# Patient Record
Sex: Male | Born: 1950
Health system: Southern US, Community
[De-identification: ages and names within clinical notes are randomized; demographics above are authoritative.]

## PROBLEM LIST (undated history)

## (undated) DIAGNOSIS — M4800 Spinal stenosis, site unspecified: Secondary | ICD-10-CM

## (undated) DIAGNOSIS — R4182 Altered mental status, unspecified: Secondary | ICD-10-CM

## (undated) DIAGNOSIS — I10 Essential (primary) hypertension: Secondary | ICD-10-CM

## (undated) DIAGNOSIS — I714 Abdominal aortic aneurysm, without rupture, unspecified: Secondary | ICD-10-CM

## (undated) DIAGNOSIS — R413 Other amnesia: Secondary | ICD-10-CM

## (undated) DIAGNOSIS — M1990 Unspecified osteoarthritis, unspecified site: Secondary | ICD-10-CM

## (undated) DIAGNOSIS — R410 Disorientation, unspecified: Secondary | ICD-10-CM

## (undated) DIAGNOSIS — R569 Unspecified convulsions: Secondary | ICD-10-CM

## (undated) HISTORY — DX: Disorientation, unspecified: R41.0

## (undated) HISTORY — DX: Other amnesia: R41.3

## (undated) HISTORY — PX: OTHER SURGICAL HISTORY: SHX169

## (undated) HISTORY — DX: Altered mental status, unspecified: R41.82

## (undated) HISTORY — PX: ABDOMINAL SURGERY: SHX537

---

## 2001-01-17 ENCOUNTER — Encounter: Payer: Self-pay | Admitting: Family Medicine

## 2001-01-17 ENCOUNTER — Ambulatory Visit (HOSPITAL_COMMUNITY): Admission: RE | Admit: 2001-01-17 | Discharge: 2001-01-17 | Payer: Self-pay | Admitting: Family Medicine

## 2003-09-21 HISTORY — PX: INGUINAL HERNIA REPAIR: SHX194

## 2003-09-21 HISTORY — PX: SMALL INTESTINE SURGERY: SHX150

## 2003-09-29 ENCOUNTER — Inpatient Hospital Stay (HOSPITAL_COMMUNITY): Admission: EM | Admit: 2003-09-29 | Discharge: 2003-10-11 | Payer: Self-pay | Admitting: Emergency Medicine

## 2003-10-13 ENCOUNTER — Emergency Department (HOSPITAL_COMMUNITY): Admission: EM | Admit: 2003-10-13 | Discharge: 2003-10-13 | Payer: Self-pay | Admitting: Emergency Medicine

## 2004-11-16 ENCOUNTER — Emergency Department (HOSPITAL_COMMUNITY): Admission: EM | Admit: 2004-11-16 | Discharge: 2004-11-16 | Payer: Self-pay | Admitting: Emergency Medicine

## 2004-11-23 ENCOUNTER — Ambulatory Visit (HOSPITAL_COMMUNITY): Admission: RE | Admit: 2004-11-23 | Discharge: 2004-11-23 | Payer: Self-pay | Admitting: Family Medicine

## 2005-05-21 ENCOUNTER — Ambulatory Visit (HOSPITAL_COMMUNITY): Admission: RE | Admit: 2005-05-21 | Discharge: 2005-05-21 | Payer: Self-pay | Admitting: Family Medicine

## 2005-12-07 ENCOUNTER — Ambulatory Visit (HOSPITAL_COMMUNITY): Admission: RE | Admit: 2005-12-07 | Discharge: 2005-12-07 | Payer: Self-pay | Admitting: Family Medicine

## 2007-09-02 ENCOUNTER — Emergency Department (HOSPITAL_COMMUNITY): Admission: EM | Admit: 2007-09-02 | Discharge: 2007-09-02 | Payer: Self-pay | Admitting: Emergency Medicine

## 2008-01-03 ENCOUNTER — Emergency Department (HOSPITAL_COMMUNITY): Admission: EM | Admit: 2008-01-03 | Discharge: 2008-01-04 | Payer: Self-pay | Admitting: Emergency Medicine

## 2010-05-22 ENCOUNTER — Emergency Department (HOSPITAL_COMMUNITY): Admission: EM | Admit: 2010-05-22 | Discharge: 2010-05-22 | Payer: Self-pay | Admitting: Emergency Medicine

## 2010-09-20 HISTORY — PX: COLONOSCOPY: SHX174

## 2010-12-03 LAB — URINALYSIS, ROUTINE W REFLEX MICROSCOPIC
Glucose, UA: 100 mg/dL — AB
Protein, ur: 30 mg/dL — AB
Specific Gravity, Urine: >1.03 — ABNORMAL HIGH (ref 1.005–1.030)
pH: 5.5 (ref 5.0–8.0)

## 2010-12-03 LAB — URINE MICROSCOPIC-ADD ON

## 2010-12-03 LAB — URINE CULTURE

## 2011-02-05 NOTE — Op Note (Signed)
NAME:  Thomas [name], Thomas [name]                             ACCOUNT NO.:  [id number]   MEDICAL RECORD NO.:  [id number]                   PATIENT TYPE:  INP   LOCATION:  A330                                 FACILITY:  [facility]   PHYSICIAN:  Dalia Heading, M.D.               DATE OF BIRTH:  [birth year]   DATE OF PROCEDURE:  09/30/2003  DATE OF DISCHARGE:                                 OPERATIVE REPORT   PREOPERATIVE DIAGNOSIS:  Incarcerated right inguinal hernia.   POSTOPERATIVE DIAGNOSIS:  Strangulated right inguinal hernia with small  bowel.   PROCEDURE:  Right inguinal herniorrhaphy for strangulation, partial small  bowel resection.   SURGEON:  Dalia Heading, M.D.   ANESTHESIA:  Spinal.   INDICATIONS:  The patient is a 60 year old black male who presented  yesterday evening to the emergency room with right groin pain and a partial  small-bowel obstruction.  The hernia was reduced without difficulty in the  emergency room.  The pain immediately was relieved.  He now presents to the  operating room for a right inguinal herniorrhaphy.  The risks and benefits  of the procedure including bleeding, infection, and the possible recurrence  of the hernia were fully explained to the patient, who gave informed  consent.   PROCEDURE NOTE:  The patient was placed in the supine position after a  spinal anesthesia was administered.  The right groin region was prepped and  draped using the usual sterile technique with Betadine.  Surgical site  confirmation was performed.   An transverse incision was made in the right groin region down to the  external oblique aponeurosis.  The aponeurosis was incised to the external  ring.  A Penrose drain was placed around he spermatic cord.  There was a  varicocele and hydrocele present.  On inspection of the indirect hernia, a 2-  3 cm segment of small bowel was noted to be gangrenous.  It had not  perforated.  It was elected to proceed with a partial small bowel  resection.  This was done through the indirect inguinal hernia without difficulty.  GIAs  were placed proximally and distally on the portion of small bowel and fired.  A side-to-side enteroenterotomy was then performed using a GIA stapler.  The  enterotomy was closed using a TA-55 stapler.  The staple line was bolstered  using 3-0 silk sutures.  The bowel was then reduced into the abdominal  cavity.  After some time, the anastomotic line was inspected and minimal  ischemia was noted.  The small-bowel tissue edges were noted to be well  vascularized.  It was elected to proceed with a hernia repair.   The indirect hernia sac was closed using a 2-0 Novofil purse-string suture  at the peritoneal reflection.  A medium size polypropylene plug was then  placed into the direct hernia without difficulty.  This was extraperitoneal  in nature.  An [place] polypropylene mesh patch was then placed along the  floor of the inguinal canal and secured superiorly to the conjoined tendon  and inferiorly to the shelving edge of Poupart's ligament using 2-0 Novofil  interrupted sutures. The internal ring was recreated using a 2-0 Novofil  interrupted suture.  The external oblique aponeurosis was reapproximated  using a 2-0 Vicryl running suture.  The subcutaneous layer was  reapproximated using a 3-0 Vicryl interrupted suture.  The skin was closed  using staples.  Betadine ointment and dry sterile dressing were applied.  Sensorcaine 0.5% was instilled into the surrounding wound.   All tape and needle counts were correct at the end of the procedure.  The  patient was transferred to PACU in stable condition.   COMPLICATIONS:  Strangulation of small bowel.   SPECIMEN:  Small bowel, partial.   BLOOD LOSS:  Minimal.      ___________________________________________                                            Dalia Heading, M.D.   MAJ/MEDQ  D:  09/30/2003  T:  09/30/2003  Job:  [id number]   cc:   Patrica Duel, M.D.  [address]  [place]  [place] [address]  Fax: [phone]

## 2011-02-05 NOTE — Op Note (Signed)
NAME:  Thomas [name], Thomas [name]                             ACCOUNT NO.:  [id number]   MEDICAL RECORD NO.:  [id number]                   PATIENT TYPE:  INP   LOCATION:  A330                                 FACILITY:  [facility]   PHYSICIAN:  Dalia Heading, M.D.               DATE OF BIRTH:  [birth year]   DATE OF PROCEDURE:  10/04/2003  DATE OF DISCHARGE:                                 OPERATIVE REPORT   PREOPERATIVE DIAGNOSIS:  Small-bowel obstruction.   POSTOPERATIVE DIAGNOSIS:  Small-bowel obstruction.   PROCEDURE:  Exploratory laparotomy, partial small bowel resection.   SURGEON:  Dalia Heading, M.D.   ANESTHESIA:  General endotracheal.   INDICATIONS FOR PROCEDURE:  The patient is a 60 year old black male five  days status post a right inguinal herniorrhaphy and partial small bowel  resection for a strangulated hernia who now presents with what appears to be  a small-bowel obstruction.  It is difficult to tell whether the patient has  further ischemia in the small bowel, twisted small bowel, or an anastomotic  stricture.  The patient now comes to the operating room for an exploratory  laparotomy.  The risks and benefits of the procedure were fully explained to  the patient who gave informed consent.   DESCRIPTION OF PROCEDURE:  The patient was placed in the supine position.  After induction of general endotracheal anesthesia, the abdomen was prepped  and draped using the usual sterile technique with Betadine.  Surgical site  confirmation was performed.   A midline incision was taken down to the fascia.  The peritoneal cavity was  entered into without difficulty.  Upon entering the abdominal cavity,  diffuse small bowel dilatation with fluid-filled loops was noted.  The bowel  was eviscerated, and the anastomotic region was inspected.  The bowel was  dilated proximally and the bowel collapsed distally.  The anastomosis was  noted to be patent though narrowed.  It was decided to redo the  anastomosis.  The bowel was first evacuated of its fluid through retrograde milking of the  small bowel to the stomach.  A GIA stapler was placed proximally and  distally along the distal small bowel around the anastomosis.  The mesentery  was divided using an LDS stapler.  A side-to-side enteroenterotomy was then  performed using a GIA 50 stapler.  The enterotomy was closed using a TA55  stapler.  The staple line was bolstered using 3-0 silk Lembert suture.  A  large anastomosis was confirmed.  The mesenteric defect was closed using an  0 chromic gut running suture.  The bowel was returned to the abdominal  cavity in an orderly fashion.  The liver, gallbladder, appendix, ascending  colon, transverse colon, and descending colon  regions were all within  normal limits.  No abnormal lesions were noted.  The right lower quadrant  where the previous herniorrhaphy was was inspected, and the hernia  repair  was noted to be intact.  The bowel was again inspected, and there was no  evidence of ischemic changes.  The anastomosis was again noted to be widely  patent.  The bowel was returned into the abdominal cavity in an orderly  fashion.  The abdominal cavity was then copiously irrigated with warm normal  saline.   The fascia was reapproximated using a looped 0 Novofil running suture.  The  subcutaneous layer was irrigated with normal saline, and the skin was closed  using staples.  Betadine ointment and dry sterile dressing were applied.   All tape and needle counts were correct at the end of the procedure.  The  patient was extubated in the operating room and went back to the recovery  room awake and in stable condition.   COMPLICATIONS:  None.   SPECIMENS:  Partial small bowel.   ESTIMATED BLOOD LOSS:  Less than 100 cc.      ___________________________________________                                            Dalia Heading, M.D.   MAJ/MEDQ  D:  10/04/2003  T:  10/04/2003  Job:   [id number]   cc:   Patrica Duel, M.D.  [address]  [place]  [place] [address]  Fax: [phone]

## 2011-02-05 NOTE — Discharge Summary (Signed)
NAME:  Thomas [name], Thomas [name]                             ACCOUNT NO.:  [id number]   MEDICAL RECORD NO.:  [id number]                   PATIENT TYPE:  INP   LOCATION:  A330                                 FACILITY:  [facility]   PHYSICIAN:  Dalia Heading, M.D.               DATE OF BIRTH:  1950/11/07   DATE OF ADMISSION:  09/29/2003  DATE OF DISCHARGE:  10/11/2003                                 DISCHARGE SUMMARY   HOSPITAL COURSE:  The patient is a 60 year old black male who presented to  the emergency room with an incarcerated right inguinal hernia.  The  incarceration was reduced, and the patient was admitted to the hospital for  preparation for surgery.  The following day, the patient was taken to the  operating room.  He was noted to have some ischemia of small bowel in the  right inguinal hernia tract.  He thus underwent a right inguinal  herniorrhaphy and partial small bowel resection.  He tolerated the procedure  well.  His postoperative course was remarkable for __________.  This  persisted and an upper GI series was performed.  There was a question of  whether he had a mechanical obstruction due to the previous repair.  He thus  was taken back to the operating room on October 04, 2003, and underwent  exploratory laparotomy and partial small bowel resection.  He did have a  twist of the distal small bowel where the anastomosis had been performed.  He tolerated this surgery well.  His postoperative course was remarkable for  a mild ileus which subsequently resolved.  His diet was advanced without  difficult.  The patient is being discharged home on October 11, 2003, in  fair and stable condition.   DISCHARGE INSTRUCTIONS:  The patient is to follow up with Dr. Franky Macho  on October 15, 2003.   DISCHARGE MEDICATIONS:  Darvocet N-100 1-2 tablets p.o. q.4h. p.r.n. pain,  dispensed 40, no refills.   PRINCIPAL DIAGNOSES:  1. Strangulated right inguinal hernia.  2. Postoperative ileus.  3.  Postoperative obstruction, mechanical.   PRINCIPAL PROCEDURES:  1. Right inguinal herniorrhaphy for strangulation, partial small bowel     resection on September 30, 2003.  2. Exploratory laparotomy, partial small bowel resection on October 04, 2003.     ___________________________________________                                         Dalia Heading, M.D.   MAJ/MEDQ  D:  10/11/2003  T:  10/11/2003  Job:  [id number]   cc:   Patrica Duel, M.D.  [address]  [place]  [place] [address]  Fax: [phone]

## 2011-02-05 NOTE — H&P (Signed)
NAME:  Thomas [name], Thomas [name]                             ACCOUNT NO.:  [id number]   MEDICAL RECORD NO.:  [id number]                   PATIENT TYPE:  EMS   LOCATION:  ED                                   FACILITY:  [facility]   PHYSICIAN:  Dalia Heading, M.D.               DATE OF BIRTH:  19-Sep-1951   DATE OF ADMISSION:  09/29/2003  DATE OF DISCHARGE:                                HISTORY & PHYSICAL   REASON FOR ADMISSION:  Incarcerated right inguinal hernia, reduced.   HISTORY OF PRESENT ILLNESS:  The patient is a 60 year old black male who was  in his usual state of health until earlier this week when he began having  right groin pain. He noticed a mass in the right groin region. Over the last  two days he started experiencing nausea and vomiting. He also had increasing  pain in the right groin region. He presented to the emergency room where he  was found to have an incarcerated right inguinal hernia. A surgical  consultation was obtained and the right inguinal hernia was reduced. He  definitely noticed relief of his pain after this procedure. He is now being  admitted to the hospital for hydration and surgical repair.   PAST MEDICAL HISTORY:  Hypertension.   PAST SURGICAL HISTORY:  Unremarkable.   CURRENT MEDICATIONS:  None.   ALLERGIES:  NO KNOWN DRUG ALLERGIES   REVIEW OF SYSTEMS:  The patient denies alcohol or tobacco use. He denies any  other cardiopulmonary difficulties or bleeding disorders.   PHYSICAL EXAMINATION:  GENERAL: The patient is a well-developed, well-  nourished black male in no acute distress after reduction of his hernia.  VITAL SIGNS: Blood pressure 167/95, pulse 77, respirations 18.  He was  orthostatic earlier and has since received a fluid bolus of normal saline.  LUNGS: Clear to auscultation with equal breath sounds bilaterally.  HEART: Regular rate and rhythm without S3, S4, or murmurs.  ABDOMEN: Soft and only slightly distended. No hepatosplenomegaly or  masses  are noted. The right inguinal hernia has reduced.  GENITOURINARY EXAM: Within normal limits.   LABORATORY DATA:  White blood cell count 12.8, hematocrit 54, platelet count  305,000. MET-7 is remarkable for a BUN of 52, creatinine 1.9.   Acute abdominal series reveals several air-fluid levels in the small bowel.   IMPRESSION:  Incarcerated right inguinal hernia, reduced.   PLAN:  The patient is being admitted to the hospital for intravenous  hydration due to orthostatic hypotension and dehydration. He will  subsequently undergo a right inguinal herniorrhaphy on September 30, 2003. The  risks and benefits of the procedure including bleeding, infection, and  recurrence of the hernia were fully explained to the patient. He gave  informed consent.     ___________________________________________  Dalia Heading, M.D.   MAJ/MEDQ  D:  09/29/2003  T:  09/30/2003  Job:  [id number]   cc:   Patrica Duel, M.D.  [address]  [place]  [place] [address]  Fax: [phone]

## 2011-02-25 ENCOUNTER — Telehealth: Payer: Self-pay

## 2011-02-25 DIAGNOSIS — Z139 Encounter for screening, unspecified: Secondary | ICD-10-CM

## 2011-02-25 NOTE — Telephone Encounter (Signed)
OK for colonoscopy.  

## 2011-02-25 NOTE — Telephone Encounter (Signed)
Gastroenterology Pre-Procedure Form  Request Date: 02/23/2011    Requesting Physician: Dr. Phillips Odor     PATIENT INFORMATION:  Thomas [name] is a 60 y.o., male (DOB=02-02-1951).  PROCEDURE: Procedure(s) requested: colonoscopy Procedure Reason: screening for colon cancer  PATIENT REVIEW QUESTIONS: The patient reports the following:   1. Diabetes Melitis: no 2. Joint replacements in the past 12 months: no 3. Major health problems in the past 3 months: no 4. Has an artificial valve or MVP:no 5. Has been advised in past to take antibiotics in advance of a procedure like teeth cleaning: no}    MEDICATIONS & ALLERGIES:    Patient reports the following regarding taking any blood thinners:   Plavix? no Aspirin?no Coumadin?  no  Patient confirms/reports the following medications:  Current Outpatient Prescriptions  Medication Sig Dispense Refill  . amLODipine (NORVASC) 2.5 MG tablet Take 2.5 mg by mouth daily.        . bisoprolol (ZEBETA) 5 MG tablet Take 5 mg by mouth daily. 1/2 tablet daily       . finasteride (PROSCAR) 5 MG tablet Take 5 mg by mouth daily.          Patient confirms/reports the following allergies:  No Known Allergies  Patient is appropriate to schedule for requested procedure(s): yes  AUTHORIZATION INFORMATION Primary Insurance: Medicare    ID number   [id number] Pre-Cert / Berkley Harvey required: Pre-Cert / Auth #  Secondary Insurance Pre-Cert / Berkley Harvey required:  Pre-Cert / Auth #:  Orders Placed This Encounter  Procedures  . Endoscopy, colon, diagnostic    Standing Status: Future     Number of Occurrences:      Standing Expiration Date: 02/25/2012    Order Specific Question:  Pre-op diagnosis    Answer:  Screening colonoscopy    Order Specific Question:  Pre-op visit required?    Answer:  No [0]    SCHEDULE INFORMATION: Procedure has been scheduled as follows:  Date: 03/08/2011, Time: 11:15 AM  Location: [facility] Short Stay  This Gastroenterology  Pre-Precedure Form is being routed to the following provider(s) for review: R. Roetta Sessions, MD

## 2011-03-08 ENCOUNTER — Ambulatory Visit (HOSPITAL_COMMUNITY)
Admission: RE | Admit: 2011-03-08 | Discharge: 2011-03-08 | Disposition: A | Discharge status: Home / Self Care | Payer: Medicare Other | Source: Ambulatory Visit | Attending: Internal Medicine | Admitting: Internal Medicine

## 2011-03-08 ENCOUNTER — Encounter: Payer: Medicare Other | Admitting: Internal Medicine

## 2011-03-08 DIAGNOSIS — K5730 Diverticulosis of large intestine without perforation or abscess without bleeding: Secondary | ICD-10-CM | POA: Insufficient documentation

## 2011-03-08 DIAGNOSIS — Z1211 Encounter for screening for malignant neoplasm of colon: Secondary | ICD-10-CM | POA: Insufficient documentation

## 2011-03-08 DIAGNOSIS — Z79899 Other long term (current) drug therapy: Secondary | ICD-10-CM | POA: Insufficient documentation

## 2011-03-08 DIAGNOSIS — I10 Essential (primary) hypertension: Secondary | ICD-10-CM | POA: Insufficient documentation

## 2011-04-05 NOTE — Op Note (Signed)
  NAME:  [name], Thomas                 ACCOUNT NO.:  [id number]  MEDICAL RECORD NO.:  [id number]  LOCATION:  DAYP                          FACILITY:  [facility]  PHYSICIAN:  R. Roetta Sessions, MD FACP FACGDATE OF BIRTH:  05/03/1951  DATE OF PROCEDURE:  03/08/2011 DATE OF DISCHARGE:                              OPERATIVE REPORT   INDICATIONS FOR PROCEDURE:  The patient is a 60 year old gentleman who has referred by Dr. Phillips Odor for screening colonoscopy.  He tells me his last colonoscopy is done 1998, which have been negative.  No family history of polyps or colon cancer and again currently no lower GI tract symptoms.  Colonoscopy is now being done as a standard screening maneuver.  Risks, benefits, limitations, alternatives and imponderables have been discussed, questions answered.  Please see the documentation in the medical record.  PROCEDURE NOTE:  O2 saturation, blood pressure, pulse and respirations were monitored throughout the entirety of the procedure.  CONSCIOUS SEDATION: 1. Versed 5 mg IV. 2. Demerol 75 mg IV in divided doses.  INSTRUMENT:  Pentax video chip system.  FINDINGS:  Digital rectal exam revealed no abnormalities.  Endoscopic findings:  Prep was adequate.  Colon:  Colonic mucosa was surveyed from the rectosigmoid junction through the left transverse right colon to the appendiceal orifice, ileocecal valve/cecum.  These structures were well seen and photographed for the record.  From this level, the scope was slowly and cautiously withdrawn.  All previously mentioned mucosal surfaces were again seen.  Aside from pancolonic diverticula, colonic mucosa appeared normal.  I pulled the scope down into the rectum where the rectal vault was found to be somewhat small.  I attempted to retroflex but was unable to do so, but for the same reason I was able to see the rectal mucosa very well en face and it appeared normal.  The patient tolerated the procedure well.  Cecal  withdrawal time 8 minutes.  IMPRESSION: 1. Normal rectum. 2. Pancolonic diverticula and colonic mucosa appeared normal.  RECOMMENDATIONS: 1. Diverticulosis literature provided to Mr. Uphoff. 2. Consider repeat screening colonoscopy in 10 years.     Jonathon Bellows, MD [facility] Essex     RMR/MEDQ  D:  03/08/2011  T:  03/09/2011  Job:  [id number]  cc:   Dr. Phillips Odor  Electronically Signed by Lorrin Goodell M.D. on 04/05/2011 09:18:28 AM

## 2012-02-22 DIAGNOSIS — I10 Essential (primary) hypertension: Secondary | ICD-10-CM | POA: Diagnosis not present

## 2012-02-22 DIAGNOSIS — N400 Enlarged prostate without lower urinary tract symptoms: Secondary | ICD-10-CM | POA: Diagnosis not present

## 2012-02-22 DIAGNOSIS — Z6825 Body mass index (BMI) 25.0-25.9, adult: Secondary | ICD-10-CM | POA: Diagnosis not present

## 2012-02-24 DIAGNOSIS — Z79899 Other long term (current) drug therapy: Secondary | ICD-10-CM | POA: Diagnosis not present

## 2012-02-24 DIAGNOSIS — R7309 Other abnormal glucose: Secondary | ICD-10-CM | POA: Diagnosis not present

## 2012-02-24 DIAGNOSIS — R972 Elevated prostate specific antigen [PSA]: Secondary | ICD-10-CM | POA: Diagnosis not present

## 2012-03-10 DIAGNOSIS — N401 Enlarged prostate with lower urinary tract symptoms: Secondary | ICD-10-CM | POA: Diagnosis not present

## 2012-03-10 DIAGNOSIS — R972 Elevated prostate specific antigen [PSA]: Secondary | ICD-10-CM | POA: Diagnosis not present

## 2012-04-11 DIAGNOSIS — N401 Enlarged prostate with lower urinary tract symptoms: Secondary | ICD-10-CM | POA: Diagnosis not present

## 2012-04-18 DIAGNOSIS — N401 Enlarged prostate with lower urinary tract symptoms: Secondary | ICD-10-CM | POA: Diagnosis not present

## 2012-04-18 DIAGNOSIS — R972 Elevated prostate specific antigen [PSA]: Secondary | ICD-10-CM | POA: Diagnosis not present

## 2012-05-25 DIAGNOSIS — H40019 Open angle with borderline findings, low risk, unspecified eye: Secondary | ICD-10-CM | POA: Diagnosis not present

## 2012-05-25 DIAGNOSIS — H5010 Unspecified exotropia: Secondary | ICD-10-CM | POA: Diagnosis not present

## 2012-05-25 DIAGNOSIS — H53029 Refractive amblyopia, unspecified eye: Secondary | ICD-10-CM | POA: Diagnosis not present

## 2012-05-25 DIAGNOSIS — H2510 Age-related nuclear cataract, unspecified eye: Secondary | ICD-10-CM | POA: Diagnosis not present

## 2012-06-06 DIAGNOSIS — H40019 Open angle with borderline findings, low risk, unspecified eye: Secondary | ICD-10-CM | POA: Diagnosis not present

## 2012-09-05 DIAGNOSIS — I10 Essential (primary) hypertension: Secondary | ICD-10-CM | POA: Diagnosis not present

## 2012-09-05 DIAGNOSIS — Z6826 Body mass index (BMI) 26.0-26.9, adult: Secondary | ICD-10-CM | POA: Diagnosis not present

## 2012-10-05 DIAGNOSIS — R972 Elevated prostate specific antigen [PSA]: Secondary | ICD-10-CM | POA: Diagnosis not present

## 2012-11-01 DIAGNOSIS — Z6826 Body mass index (BMI) 26.0-26.9, adult: Secondary | ICD-10-CM | POA: Diagnosis not present

## 2012-11-01 DIAGNOSIS — R4182 Altered mental status, unspecified: Secondary | ICD-10-CM | POA: Diagnosis not present

## 2013-02-03 ENCOUNTER — Encounter: Payer: Self-pay | Admitting: Neurology

## 2013-02-03 DIAGNOSIS — R4182 Altered mental status, unspecified: Secondary | ICD-10-CM | POA: Insufficient documentation

## 2013-02-05 ENCOUNTER — Ambulatory Visit: Payer: Medicare Other | Admitting: Neurology

## 2013-04-20 DIAGNOSIS — N401 Enlarged prostate with lower urinary tract symptoms: Secondary | ICD-10-CM | POA: Diagnosis not present

## 2013-04-20 DIAGNOSIS — R972 Elevated prostate specific antigen [PSA]: Secondary | ICD-10-CM | POA: Diagnosis not present

## 2013-07-30 DIAGNOSIS — Z23 Encounter for immunization: Secondary | ICD-10-CM | POA: Diagnosis not present

## 2013-08-30 ENCOUNTER — Emergency Department (HOSPITAL_COMMUNITY): Payer: Medicare Other

## 2013-08-30 ENCOUNTER — Emergency Department (HOSPITAL_COMMUNITY)
Admission: EM | Admit: 2013-08-30 | Discharge: 2013-08-30 | Disposition: A | Discharge status: Home / Self Care | Payer: Medicare Other | Attending: Emergency Medicine | Admitting: Emergency Medicine

## 2013-08-30 ENCOUNTER — Encounter (HOSPITAL_COMMUNITY): Payer: Self-pay | Admitting: Emergency Medicine

## 2013-08-30 DIAGNOSIS — F10239 Alcohol dependence with withdrawal, unspecified: Secondary | ICD-10-CM

## 2013-08-30 DIAGNOSIS — R42 Dizziness and giddiness: Secondary | ICD-10-CM | POA: Insufficient documentation

## 2013-08-30 DIAGNOSIS — R569 Unspecified convulsions: Secondary | ICD-10-CM | POA: Diagnosis not present

## 2013-08-30 DIAGNOSIS — F10939 Alcohol use, unspecified with withdrawal, unspecified: Secondary | ICD-10-CM | POA: Insufficient documentation

## 2013-08-30 DIAGNOSIS — K921 Melena: Secondary | ICD-10-CM | POA: Insufficient documentation

## 2013-08-30 DIAGNOSIS — Z7982 Long term (current) use of aspirin: Secondary | ICD-10-CM | POA: Insufficient documentation

## 2013-08-30 DIAGNOSIS — R195 Other fecal abnormalities: Secondary | ICD-10-CM

## 2013-08-30 DIAGNOSIS — S0990XA Unspecified injury of head, initial encounter: Secondary | ICD-10-CM | POA: Diagnosis not present

## 2013-08-30 DIAGNOSIS — Z79899 Other long term (current) drug therapy: Secondary | ICD-10-CM | POA: Insufficient documentation

## 2013-08-30 DIAGNOSIS — I10 Essential (primary) hypertension: Secondary | ICD-10-CM | POA: Insufficient documentation

## 2013-08-30 HISTORY — DX: Essential (primary) hypertension: I10

## 2013-08-30 HISTORY — DX: Unspecified convulsions: R56.9

## 2013-08-30 LAB — CBC WITH DIFFERENTIAL/PLATELET
Eosinophils Absolute: 0 10*3/uL (ref 0.0–0.7)
Eosinophils Relative: 0 % (ref 0–5)
HCT: 38.2 % — ABNORMAL LOW (ref 39.0–52.0)
Hemoglobin: 13.3 g/dL (ref 13.0–17.0)
Lymphocytes Relative: 15 % (ref 12–46)
Lymphs Abs: 1.4 10*3/uL (ref 0.7–4.0)
MCH: 33.8 pg (ref 26.0–34.0)
MCV: 97.2 fL (ref 78.0–100.0)
Monocytes Relative: 6 % (ref 3–12)
RBC: 3.93 MIL/uL — ABNORMAL LOW (ref 4.22–5.81)

## 2013-08-30 LAB — ETHANOL: Alcohol, Ethyl (B): <11 mg/dL (ref 0–11)

## 2013-08-30 LAB — COMPREHENSIVE METABOLIC PANEL
Alkaline Phosphatase: 70 U/L (ref 39–117)
BUN: 45 mg/dL — ABNORMAL HIGH (ref 6–23)
CO2: 27 mEq/L (ref 19–32)
Calcium: 9.8 mg/dL (ref 8.4–10.5)
GFR calc Af Amer: >90 mL/min (ref >90)
GFR calc non Af Amer: 86 mL/min — ABNORMAL LOW (ref >90)
Glucose, Bld: 158 mg/dL — ABNORMAL HIGH (ref 70–99)
Total Protein: 8.2 g/dL (ref 6.0–8.3)

## 2013-08-30 MED ORDER — LORAZEPAM 1 MG PO TABS: 2.0000 mg | ORAL_TABLET | Freq: Four times a day (QID) | ORAL | Status: DC | PRN

## 2013-08-30 MED ORDER — ESOMEPRAZOLE MAGNESIUM 40 MG PO CPDR: 40.0000 mg | DELAYED_RELEASE_CAPSULE | Freq: Every day | ORAL | Status: DC

## 2013-08-30 NOTE — ED Notes (Signed)
Per EDP hemoccult was positive.

## 2013-08-30 NOTE — ED Provider Notes (Signed)
CSN: [id number]     Arrival date & time 08/30/13  8295 History   First MD Initiated Contact with Patient 08/30/13 0526     Chief Complaint  Patient presents with  . Seizures   (Consider location/radiation/quality/duration/timing/severity/associated sxs/prior Treatment) HPI Comments: Patient is a 62 year old male with history of hypertension. He also has a history of seizures however is been off his gland for 15 years and has been seizure-free since that time. Presents tonight after an episode that occurred at home. He states that he felt very dizzy and then apparently fell to the floor. His nephew apparently witnessed some tonic-clonic movements and assume that he had had a seizure. This lasted several seconds, then resolved. Patient tells me he never lost consciousness and right now he feels fine. He does report having some dark stools recently but denies a history of ulcers or GI bleeding.  Patient is a 62 y.o. male presenting with seizures. The history is provided by the patient.  Seizures Seizure activity on arrival: no   Seizure type:  Myoclonic Preceding symptoms: dizziness   Initial focality:  None Episode characteristics: no combativeness, no confusion and responsive   Return to baseline: yes   Severity:  Moderate Timing:  Once   Past Medical History  Diagnosis Date  . Memory loss   . Altered mental status   . Seizures   . Hypertension    History reviewed. No pertinent past surgical history. No family history on file. History  Substance Use Topics  . Smoking status: Never Smoker   . Smokeless tobacco: Not on file  . Alcohol Use: Yes    Review of Systems  Neurological: Positive for seizures.  All other systems reviewed and are negative.    Allergies  Review of patient's allergies indicates no known allergies.  Home Medications   Current Outpatient Rx  Name  Route  Sig  Dispense  Refill  . amLODipine (NORVASC) 2.5 MG tablet   Oral   Take 2.5 mg by mouth  daily.           Marland Kitchen aspirin 81 MG tablet   Oral   Take 81 mg by mouth daily.         . finasteride (PROSCAR) 5 MG tablet   Oral   Take 5 mg by mouth daily.           . hydrochlorothiazide (HYDRODIURIL) 25 MG tablet   Oral   Take 25 mg by mouth daily.         . bisoprolol (ZEBETA) 5 MG tablet   Oral   Take 5 mg by mouth daily. 1/2 tablet daily           BP 128/77  Pulse 85  Temp(Src) 98.1 F (36.7 C) (Oral)  Resp 20  SpO2 98% Physical Exam  Nursing note and vitals reviewed. Constitutional: He is oriented to person, place, and time. He appears well-developed and well-nourished. No distress.  HENT:  Head: Normocephalic and atraumatic.  Mouth/Throat: Oropharynx is clear and moist.  Eyes: Pupils are equal, round, and reactive to light.  He is noted to have asymmetrical eye movements, condition he tells me he has had for a long time.  Neck: Normal range of motion. Neck supple.  Cardiovascular: Normal rate, regular rhythm and normal heart sounds.   No murmur heard. Pulmonary/Chest: Effort normal and breath sounds normal. No respiratory distress. He has no wheezes. He has no rales.  Abdominal: Soft. Bowel sounds are normal. He exhibits no distension.  There is no tenderness.  Genitourinary: Guaiac positive stool.  Musculoskeletal: Normal range of motion. He exhibits no edema.  Neurological: He is alert and oriented to person, place, and time. No cranial nerve deficit. He exhibits normal muscle tone. Coordination normal.  Skin: Skin is warm and dry. He is not diaphoretic.    ED Course  Procedures (including critical care time) Labs Review Labs Reviewed  CBC WITH DIFFERENTIAL  COMPREHENSIVE METABOLIC PANEL  OCCULT BLOOD X 1 CARD TO LAB, STOOL  ETHANOL   Imaging Review No results found.   Date: 08/30/2013  Rate: 83  Rhythm: normal sinus rhythm  QRS Axis: left  Intervals: normal  ST/T Wave abnormalities: nonspecific T wave changes  Conduction  Disutrbances:none  Narrative Interpretation:   Old EKG Reviewed: none available    MDM  No diagnosis found. Patient is a 62 year old male brought by ambulance after an apparent syncopal episode or seizure while at home. From speaking with him and the family appears that he drinks up to a case of beer per day. He hasn't had any alcohol in approximately 2 days. He was also noted to have a large dark bowel movement prior to this episode. His blood pressure is stable and his hemoglobin is 13.3. He has remained hemodynamically stable and has not had further bleeding while in the emergency department. His workup was otherwise unremarkable.  I discussed this patient with Dr. Sherrie Mustache from internal medicine who does not believe this patient meets the criteria for admission and as instructed me to discharge him to home. She is recommending protonix. for what sounds like an alcoholic gastritis and medications for his seizure. As I suspect this is related to alcohol withdrawal I will prescribe Ativan.  He does have an appointment with his primary doctor tomorrow and I have advised him to keep this appointment. If anything worsens or changes in the meantime he is to come back here to be evaluated.    Geoffery Lyons, MD 08/30/13 [phone]

## 2013-08-30 NOTE — ED Notes (Signed)
Pt had seizure at home, ems was called by pt's nephew.  Pt states "I didn't really go out, but I know I had a seizure because I have had it like this before"  Pt is awake, alert, oriented, denies complaints.

## 2013-08-30 NOTE — Discharge Instructions (Signed)
Protonix as prescribed.  Ativan 2 mg every 6 hours as needed for withdrawal symptoms.  Return to the emergency department if you experience chest pain, shortness of breath, dizziness, or worsening of your symptoms.   Alcohol Withdrawal Anytime drug use is interfering with normal living activities it has become abuse. This includes problems with family and friends. Psychological dependence has developed when your mind tells you that the drug is needed. This is usually followed by physical dependence when a continuing increase of drugs are required to get the same feeling or "high." This is known as addiction or chemical dependency. A person's risk is much higher if there is a history of chemical dependency in the family. Mild Withdrawal Following Stopping Alcohol, When Addiction or Chemical Dependency Has Developed When a person has developed tolerance to alcohol, any sudden stopping of alcohol can cause uncomfortable physical symptoms. Most of the time these are mild and consist of tremors in the hands and increases in heart rate, breathing, and temperature. Sometimes these symptoms are associated with anxiety, panic attacks, and bad dreams. There may also be stomach upset. Normal sleep patterns are often interrupted with periods of inability to sleep (insomnia). This may last for 6 months. Because of this discomfort, many people choose to continue drinking to get rid of this discomfort and to try to feel normal. Severe Withdrawal with Decreased or No Alcohol Intake, When Addiction or Chemical Dependency Has Developed About five percent of alcoholics will develop signs of severe withdrawal when they stop using alcohol. One sign of this is development of generalized seizures (convulsions). Other signs of this are severe agitation and confusion. This may be associated with believing in things which are not real or seeing things which are not really there (delusions and hallucinations). Vitamin deficiencies  are usually present if alcohol intake has been long-term. Treatment for this most often requires hospitalization and close observation. Addiction can only be helped by stopping use of all chemicals. This is hard but may save your life. With continual alcohol use, possible outcomes are usually loss of self respect and esteem, violence, and death. Addiction cannot be cured but it can be stopped. This often requires outside help and the care of professionals. Treatment centers are listed in the yellow pages under Cocaine, Narcotics, and Alcoholics Anonymous. Most hospitals and clinics can refer you to a specialized care center. It is not necessary for you to go through the uncomfortable symptoms of withdrawal. Your caregiver can provide you with medicines that will help you through this difficult period. Try to avoid situations, friends, or drugs that made it possible for you to keep using alcohol in the past. Learn how to say no. It takes a long period of time to overcome addictions to all drugs, including alcohol. There may be many times when you feel as though you want a drink. After getting rid of the physical addiction and withdrawal, you will have a lessening of the craving which tells you that you need alcohol to feel normal. Call your caregiver if more support is needed. Learn who to talk to in your family and among your friends so that during these periods you can receive outside help. Alcoholics Anonymous (AA) has helped many people over the years. To get further help, contact AA or call your caregiver, counselor, or clergyperson. Al-Anon and Alateen are support groups for friends and family members of an alcoholic. The people who love and care for an alcoholic often need help, too. For information about  these organizations, check your phone directory or call a local alcoholism treatment center.  SEEK IMMEDIATE MEDICAL CARE IF:   You have a seizure.  You have a fever.  You experience uncontrolled  vomiting or you vomit up blood. This may be bright red or look like black coffee grounds.  You have blood in the stool. This may be bright red or appear as a black, tarry, bad-smelling stool.  You become lightheaded or faint. Do not drive if you feel this way. Have someone else drive you or call [phone] for help.  You become more agitated or confused.  You develop uncontrolled anxiety.  You begin to see things that are not really there (hallucinate). Your caregiver has determined that you completely understand your medical condition, and that your mental state is back to normal. You understand that you have been treated for alcohol withdrawal, have agreed not to drink any alcohol for a minimum of 1 day, will not operate a car or other machinery for 24 hours, and have had an opportunity to ask any questions about your condition. Document Released: 06/16/2005 Document Revised: 11/29/2011 Document Reviewed: 04/24/2008 [facility] Patient Information 2014 [place], [place].  Gastrointestinal Bleeding Gastrointestinal bleeding is bleeding somewhere along the path that food travels through the body (digestive tract). This path is anywhere between the mouth and the opening of the butt (anus). You may have blood in your throw up (vomit) or in your poop (stools). If there is a lot of bleeding, you may need to stay in the hospital. HOME CARE  Only take medicine as told by your doctor.  Eat foods with fiber such as whole grains, fruits, and vegetables. You can also try eating 1 to 3 prunes a day.  Drink enough fluids to keep your pee (urine) clear or pale yellow. GET HELP RIGHT AWAY IF:   Your bleeding gets worse.  You feel dizzy, weak, or you pass out (faint).  You have bad cramps in your back or belly (abdomen).  You have large blood clumps (clots) in your poop.  Your problems are getting worse. MAKE SURE YOU:   Understand these instructions.  Will watch your condition.  Will get help right  away if you are not doing well or get worse. Document Released: 06/15/2008 Document Revised: 08/23/2012 Document Reviewed: 08/16/2011 [facility] Patient Information 2014 [place], [place].

## 2013-10-26 DIAGNOSIS — N401 Enlarged prostate with lower urinary tract symptoms: Secondary | ICD-10-CM | POA: Diagnosis not present

## 2013-10-26 DIAGNOSIS — N138 Other obstructive and reflux uropathy: Secondary | ICD-10-CM | POA: Diagnosis not present

## 2013-10-26 DIAGNOSIS — R972 Elevated prostate specific antigen [PSA]: Secondary | ICD-10-CM | POA: Diagnosis not present

## 2014-06-18 DIAGNOSIS — Z23 Encounter for immunization: Secondary | ICD-10-CM | POA: Diagnosis not present

## 2014-06-18 DIAGNOSIS — R972 Elevated prostate specific antigen [PSA]: Secondary | ICD-10-CM | POA: Diagnosis not present

## 2014-06-18 DIAGNOSIS — I10 Essential (primary) hypertension: Secondary | ICD-10-CM | POA: Diagnosis not present

## 2014-06-18 DIAGNOSIS — Z6830 Body mass index (BMI) 30.0-30.9, adult: Secondary | ICD-10-CM | POA: Diagnosis not present

## 2014-07-22 DIAGNOSIS — H5211 Myopia, right eye: Secondary | ICD-10-CM | POA: Diagnosis not present

## 2014-07-22 DIAGNOSIS — H25099 Other age-related incipient cataract, unspecified eye: Secondary | ICD-10-CM | POA: Diagnosis not present

## 2014-07-22 DIAGNOSIS — H5052 Exophoria: Secondary | ICD-10-CM | POA: Diagnosis not present

## 2014-07-22 DIAGNOSIS — H52223 Regular astigmatism, bilateral: Secondary | ICD-10-CM | POA: Diagnosis not present

## 2014-08-02 DIAGNOSIS — R350 Frequency of micturition: Secondary | ICD-10-CM | POA: Diagnosis not present

## 2014-08-02 DIAGNOSIS — N401 Enlarged prostate with lower urinary tract symptoms: Secondary | ICD-10-CM | POA: Diagnosis not present

## 2014-08-02 DIAGNOSIS — R972 Elevated prostate specific antigen [PSA]: Secondary | ICD-10-CM | POA: Diagnosis not present

## 2015-01-01 DIAGNOSIS — Z23 Encounter for immunization: Secondary | ICD-10-CM | POA: Diagnosis not present

## 2015-01-01 DIAGNOSIS — R7309 Other abnormal glucose: Secondary | ICD-10-CM | POA: Diagnosis not present

## 2015-01-01 DIAGNOSIS — Z1389 Encounter for screening for other disorder: Secondary | ICD-10-CM | POA: Diagnosis not present

## 2015-01-01 DIAGNOSIS — R413 Other amnesia: Secondary | ICD-10-CM | POA: Diagnosis not present

## 2015-01-01 DIAGNOSIS — Z0000 Encounter for general adult medical examination without abnormal findings: Secondary | ICD-10-CM | POA: Diagnosis not present

## 2015-01-01 DIAGNOSIS — Z6829 Body mass index (BMI) 29.0-29.9, adult: Secondary | ICD-10-CM | POA: Diagnosis not present

## 2015-01-01 DIAGNOSIS — E663 Overweight: Secondary | ICD-10-CM | POA: Diagnosis not present

## 2015-01-01 DIAGNOSIS — Z79899 Other long term (current) drug therapy: Secondary | ICD-10-CM | POA: Diagnosis not present

## 2015-01-01 DIAGNOSIS — Z0001 Encounter for general adult medical examination with abnormal findings: Secondary | ICD-10-CM | POA: Diagnosis not present

## 2015-02-13 ENCOUNTER — Encounter: Payer: Self-pay | Admitting: *Deleted

## 2015-02-14 ENCOUNTER — Ambulatory Visit (INDEPENDENT_AMBULATORY_CARE_PROVIDER_SITE_OTHER): Payer: Medicare Other | Admitting: Neurology

## 2015-02-14 ENCOUNTER — Encounter: Payer: Self-pay | Admitting: Neurology

## 2015-02-14 VITALS — BP 157/87 | HR 69 | Ht 68.0 in | Wt 179.8 lb

## 2015-02-14 DIAGNOSIS — F1027 Alcohol dependence with alcohol-induced persisting dementia: Secondary | ICD-10-CM | POA: Diagnosis not present

## 2015-02-14 DIAGNOSIS — R413 Other amnesia: Secondary | ICD-10-CM | POA: Diagnosis not present

## 2015-02-14 MED ORDER — DONEPEZIL HCL 5 MG PO TABS: 5.0000 mg | ORAL_TABLET | Freq: Every day | ORAL | Status: DC

## 2015-02-14 NOTE — Patient Instructions (Addendum)
I had a long discussion with the patient and his nephew regarding his long-standing memory loss which seem to be progressive and are superimposed on his mild baseline cognitive impairment which has been lifelong. Check dementia panel labs to look for reversible causes as well as EEG and MRI scan of the brain. I counseled the patient to quit alcohol as it may represent reversible causes of cognitive impairment in his case. Start Aricept 5 mg daily for 4 weeks increase if tolerated without side effects to 10 mg daily. I discussed possible side effects with them and advised him to call me if needed. Return for follow-up in 2 months or call earlier if necessary. Dementia Dementia is a general term for problems with brain function. A person with dementia has memory loss and a hard time with at least one other brain function such as thinking, speaking, or problem solving. Dementia can affect social functioning, how you do your job, your mood, or your personality. The changes may be hidden for a long time. The earliest forms of this disease are usually not detected by family or friends. Dementia can be:  Irreversible.  Potentially reversible.  Partially reversible.  Progressive. This means it can get worse over time. CAUSES  Irreversible dementia causes may include:  Degeneration of brain cells (Alzheimer disease or Lewy body dementia).  Multiple small strokes (vascular dementia).  Infection (chronic meningitis or Creutzfeldt-Jakob disease).  Frontotemporal dementia. This affects younger people, age 29 to 65, compared to those who have Alzheimer disease.  Dementia associated with other disorders like Parkinson disease, Huntington disease, or HIV-associated dementia. Potentially or partially reversible dementia causes may include:  Medicines.  Metabolic causes such as excessive alcohol intake, vitamin B12 deficiency, or thyroid disease.  Masses or pressure in the brain such as a tumor, blood  clot, or hydrocephalus. SIGNS AND SYMPTOMS  Symptoms are often hard to detect. Family members or coworkers may not notice them early in the disease process. Different people with dementia may have different symptoms. Symptoms can include:  A hard time with memory, especially recent memory. Long-term memory may not be impaired.  Asking the same question multiple times or forgetting something someone just said.  A hard time speaking your thoughts or finding certain words.  A hard time solving problems or performing familiar tasks (such as how to use a telephone).  Sudden changes in mood.  Changes in personality, especially increasing moodiness or mistrust.  Depression.  A hard time understanding complex ideas that were never a problem in the past. DIAGNOSIS  There are no specific tests for dementia.   Your health care provider may recommend a thorough evaluation. This is because some forms of dementia can be reversible. The evaluation will likely include a physical exam and getting a detailed history from you and a family member. The history often gives the best clues and suggestions for a diagnosis.  Memory testing may be done. A detailed brain function evaluation called neuropsychologic testing may be helpful.  Lab tests and brain imaging (such as a CT scan or MRI scan) are sometimes important.  Sometimes observation and re-evaluation over time is very helpful. TREATMENT  Treatment depends on the cause.   If the problem is a vitamin deficiency, it may be helped or cured with supplements.  For dementias such as Alzheimer disease, medicines are available to stabilize or slow the course of the disease. There are no cures for this type of dementia.  Your health care provider can help direct  you to groups, organizations, and other health care providers to help with decisions in the care of you or your loved one. HOME CARE INSTRUCTIONS The care of individuals with dementia is varied  and dependent upon the progression of the dementia. The following suggestions are intended for the person living with, or caring for, the person with dementia.  Create a safe environment.  Remove the locks on bathroom doors to prevent the person from accidentally locking himself or herself in.  Use childproof latches on kitchen cabinets and any place where cleaning supplies, chemicals, or alcohol are kept.  Use childproof covers in unused electrical outlets.  Install childproof devices to keep doors and windows secured.  Remove stove knobs or install safety knobs and an automatic shut-off on the stove.  Lower the temperature on water heaters.  Label medicines and keep them locked up.  Secure knives, lighters, matches, power tools, and guns, and keep these items out of reach.  Keep the house free from clutter. Remove rugs or anything that might contribute to a fall.  Remove objects that might break and hurt the person.  Make sure lighting is good, both inside and outside.  Install grab rails as needed.  Use a monitoring device to alert you to falls or other needs for help.  Reduce confusion.  Keep familiar objects and people around.  Use night lights or dim lights at night.  Label items or areas.  Use reminders, notes, or directions for daily activities or tasks.  Keep a simple, consistent routine for waking, meals, bathing, dressing, and bedtime.  Create a calm, quiet environment.  Place large clocks and calendars prominently.  Display emergency numbers and home address near all telephones.  Use cues to establish different times of the day. An example is to open curtains to let the natural light in during the day.   Use effective communication.  Choose simple words and short sentences.  Use a gentle, calm tone of voice.  Be careful not to interrupt.  If the person is struggling to find a word or communicate a thought, try to provide the word or  thought.  Ask one question at a time. Allow the person ample time to answer questions. Repeat the question again if the person does not respond.  Reduce nighttime restlessness.  Provide a comfortable bed.  Have a consistent nighttime routine.  Ensure a regular walking or physical activity schedule. Involve the person in daily activities as much as possible.  Limit napping during the day.  Limit caffeine.  Attend social events that stimulate rather than overwhelm the senses.  Encourage good nutrition and hydration.  Reduce distractions during meal times and snacks.  Avoid foods that are too hot or too cold.  Monitor chewing and swallowing ability.  Continue with routine vision, hearing, dental, and medical screenings.  Give medicines only as directed by the health care provider.  Monitor driving abilities. Do not allow the person to drive when safe driving is no longer possible.  Register with an identification program which could provide location assistance in the event of a missing person situation. SEEK MEDICAL CARE IF:   New behavioral problems start such as moodiness, aggressiveness, or seeing things that are not there (hallucinations).  Any new problem with brain function happens. This includes problems with balance, speech, or falling a lot.  Problems with swallowing develop.  Any symptoms of other illness happen. Small changes or worsening in any aspect of brain function can be a sign that  the illness is getting worse. It can also be a sign of another medical illness such as infection. Seeing a health care provider right away is important. SEEK IMMEDIATE MEDICAL CARE IF:   A fever develops.  New or worsened confusion develops.  New or worsened sleepiness develops.  Staying awake becomes hard to do. Document Released: 03/02/2001 Document Revised: 01/21/2014 Document Reviewed: 02/01/2011 [facility] Patient Information 2015 [place], [place]. This information is  not intended to replace advice given to you by your health care provider. Make sure you discuss any questions you have with your health care provider.

## 2015-02-14 NOTE — Progress Notes (Signed)
[facility] [address] [place]. [place] [id number] [phone]       OFFICE CONSULT NOTE  [name] Date of Birth:  April 04, 1951 Medical Record Number:  [id number]   Referring MD:  Shawnie DapperBenjamin L Mann, PA-C  Reason for Referral: memory loss  HPI: 89 year African-American male who is accompanied by his nephew who provides most of the history. Patient has had some progressive mild memory difficulties for the last 10 months or so. He forgets recent activities that he was involved in and gets distracted. He has left the stove on a couple of times. He often forgets if door is locked or not. There are times when he can stop in mid sentence and search for words and even forget the names of family members. He lives with his nephew but is quite independent in activities of daily living except his not very well organized. 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 He is now retired. There is a history of stroke a few years ago but neither the patient or nephew are able to give me any details but he apparently had no residual deficits from it. He does have a history of heavy alcohol use but he has cut back in the last few years but still drinks 6 packs of beer per day. There is no definite history of Alzheimer's in the family with his mother who had stroke did have some memory difficulties. There is no history of seizures, falls, loss of consciousness or significant head injury. He has not had any evaluation for memory loss or recent brain imaging studies done.  ROS:   14 system review of systems is positive for  memory loss, confusion, passing out, is change in appetite and all other systems negative PMH:  Past Medical History  Diagnosis Date  . Memory loss   . Altered mental status   . Hypertension   . Seizures     years ago  .  Confusion     Social History:  History   Social History  . Marital Status: Single    Spouse Name: N/A  . Number of Children: 0  . Years of Education: 7   Occupational History  . 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 Smoking status: Never Smoker   . Smokeless tobacco: Not on file  . Alcohol Use: Yes     Comment: 6 beers daily  . Drug Use: No  . Sexual Activity: Not on file   Other Topics Concern  . Not on file   Social History Narrative   Patient lives with nephew Thomas [name](Thomas [name])   Right handed   5 brothers, 3 sisters   caffeine use - coffee 1 cup daily    Medications:   Current Outpatient Prescriptions on File Prior to Visit  Medication Sig Dispense Refill  . aspirin 81 MG tablet Take 81 mg by mouth daily.     No current facility-administered medications on file prior to visit.    Allergies:  No Known Allergies  Physical Exam General: well developed, well nourished middle-aged African-American male, seated, in no evident distress Head: head normocephalic and atraumatic.   Neck: supple with no carotid or supraclavicular bruits Cardiovascular: regular rate and rhythm, no murmurs Musculoskeletal: Old small left temporal bony deformity  from old injury Skin:  no rash/petichiae Vascular:  Normal pulses all extremities  Neurologic Exam Mental Status: Awake and fully alert. Oriented to place and time. Recent and remote memory poor. Attention span, concentration and fund of knowledge diminished. Mini-Mental status exam score of 13/30. Patient has only a seventh grade education. Animal naming test 6 only. Clock drawing 3/4. Geriatric depression scale 0 and not depressed.. Mood and affect appropriate.  Cranial Nerves: Fundoscopic exam reveals sharp disc margins. Pupils equal, briskly reactive to light. Extraocular movements full without nystagmus but show exudative croupy of the right eye.. Visual fields full to confrontation. Hearing intact. Facial  sensation intact. Face, tongue, palate moves normally and symmetrically.  Motor: Normal bulk and tone. Normal strength in all tested extremity muscles. Sensory.: intact to touch , pinprick , position and vibratory sensation.  Coordination: Rapid alternating movements normal in all extremities. Finger-to-nose and heel-to-shin performed accurately bilaterally. Gait and Station: Arises from chair without difficulty. Stance is normal. Gait demonstrates normal stride length and balance . Able to heel, toe and tandem walk without difficulty.  Reflexes: 1+ and symmetric. Toes downgoing.       ASSESSMENT: 43 year African-American male with one year history of progressive memory loss likely from mild dementia versus   mild cognitive impairment in a  patient who at baseline has low cognition. Chronic alcohol abuse may also contribute to his cognitive impairment    PLAN: I had a long discussion with the patient and his nephew regarding his long-standing memory loss which seem to be progressive and are superimposed on his mild baseline cognitive impairment which has been lifelong. Check dementia panel labs to look for reversible causes as well as EEG and MRI scan of the brain. I counseled the patient to quit alcohol as it may represent reversible causes of cognitive impairment in his case. Start Aricept 5 mg daily for 4 weeks increase if tolerated without side effects to 10 mg daily. I discussed possible side effects with them and advised him to call me if needed. Return for follow-up in 2 months or call earlier if necessary. Delia Heady, MD Note: This document was prepared with digital dictation and possible smart phrase technology. Any transcriptional errors that result from this process are unintentional.

## 2015-02-17 LAB — DEMENTIA PANEL
HOMOCYSTEINE: 17 umol/L — AB (ref 0.0–15.0)
RPR: NONREACTIVE
TSH: 0.81 u[IU]/mL (ref 0.450–4.500)
Vitamin B-12: 333 pg/mL (ref 211–946)

## 2015-02-19 ENCOUNTER — Ambulatory Visit
Admission: RE | Admit: 2015-02-19 | Discharge: 2015-02-19 | Disposition: A | Discharge status: Home / Self Care | Payer: Medicare Other | Source: Ambulatory Visit | Attending: Neurology | Admitting: Neurology

## 2015-02-19 DIAGNOSIS — F1027 Alcohol dependence with alcohol-induced persisting dementia: Secondary | ICD-10-CM

## 2015-02-19 DIAGNOSIS — R413 Other amnesia: Secondary | ICD-10-CM

## 2015-02-21 ENCOUNTER — Telehealth: Payer: Self-pay | Admitting: *Deleted

## 2015-02-21 ENCOUNTER — Ambulatory Visit (INDEPENDENT_AMBULATORY_CARE_PROVIDER_SITE_OTHER): Payer: Medicare Other | Admitting: Neurology

## 2015-02-21 DIAGNOSIS — F1027 Alcohol dependence with alcohol-induced persisting dementia: Secondary | ICD-10-CM

## 2015-02-21 DIAGNOSIS — R413 Other amnesia: Secondary | ICD-10-CM

## 2015-02-21 NOTE — Procedures (Signed)
    History:  Thomas [name] is a 64 year old patient with a history of alcohol abuse with a memory disorder. The patient has had increasing problems with forgetfulness. There is no definite history of seizures. The patient is being evaluated for the memory issue.  This is a routine EEG. No skull defects are noted. Medications include Norvasc, aspirin, Ziac, and Aricept.   EEG classification: Normal awake  Description of the recording: The background rhythms of this recording consists of a fairly well modulated medium amplitude alpha rhythm of  8 Hz that is reactive to eye opening and closure. As the record progresses, the patient appears to remain in the waking state throughout the recording. Photic stimulation was performed, resulting in a bilateral and symmetric photic driving response. Hyperventilation was also performed, resulting in a minimal buildup of the background rhythm activities without significant slowing seen. At no time during the recording does there appear to be evidence of spike or spike wave discharges or evidence of focal slowing. EKG monitor shows no evidence of cardiac rhythm abnormalities with a heart rate of 56.  Impression: This is a normal EEG recording in the waking state. No evidence of ictal or interictal discharges are seen.

## 2015-02-21 NOTE — Telephone Encounter (Signed)
Per Dr Anne HahnWillis, called patient and informed him that his EEG results are normal. Patient verbalized understanding, appreciation.

## 2015-05-06 ENCOUNTER — Ambulatory Visit (INDEPENDENT_AMBULATORY_CARE_PROVIDER_SITE_OTHER): Payer: Medicare Other | Admitting: Neurology

## 2015-05-06 ENCOUNTER — Encounter: Payer: Self-pay | Admitting: Neurology

## 2015-05-06 VITALS — BP 143/91 | HR 53 | Ht 68.0 in | Wt 185.6 lb

## 2015-05-06 DIAGNOSIS — F0390 Unspecified dementia without behavioral disturbance: Secondary | ICD-10-CM

## 2015-05-06 DIAGNOSIS — R413 Other amnesia: Secondary | ICD-10-CM | POA: Diagnosis not present

## 2015-05-06 MED ORDER — RIVASTIGMINE 4.6 MG/24HR TD PT24: 4.6000 mg | MEDICATED_PATCH | Freq: Every day | TRANSDERMAL | Status: DC

## 2015-05-06 MED ORDER — FOLIC ACID 1 MG PO TABS: 1.0000 mg | ORAL_TABLET | Freq: Every day | ORAL | Status: DC

## 2015-05-06 NOTE — Progress Notes (Signed)
[facility] [address] [place]. [place] [address] [phone]       OFFICE FOLLOW UP VISIT NOTE  [name] Date of Birth:  04/06/1951 Medical Record Number:  [id number]   Referring MD:  Thomas Dapper, PA-C  Reason for Referral: memory loss  HPI:  Initial Consult 02/14/2015 :71 year African-American male who is accompanied by his nephew who provides most of the history. Patient has had some progressive mild memory difficulties for the last 10 months or so. He forgets recent activities that he was involved in and gets distracted. He has left the stove on a couple of times. He often forgets if door is locked or not. There are times when he can stop in mid sentence and search for words and even forget the names of family members. He lives with his nephew but is quite independent in activities of daily living except his not very well organized. 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 He is now retired. There is a history of stroke a few years ago but neither the patient or nephew are able to give me any details but he apparently had no residual deficits from it. He does have a history of heavy alcohol use but he has cut back in the last few years but still drinks 6 packs of beer per day. There is no definite history of Alzheimer's in the family with his mother who had stroke did have some memory difficulties. There is no history of seizures, falls, loss of consciousness or significant head injury. He has not had any evaluation for memory loss or recent brain imaging studies done. Update 05/06/2015 : He returns for follow-up after last visit 2 and of months ago. He reports no change in his memory and cognitive difficulties with unchanged. He was unable to tolerate Aricept and stopped it within a few days due to upset stomach. He  has however cut back alcohol intake now to 4-5 beers per week. Patient is accompanied by son who provides most of the history. Patient did undergo MRI scan of the brain on 02/19/15 which had ordered with and personally reviewed shows mild generalized atrophy but no significant abnormalities. Lab work on 5/27 /16 for reversible causes of cognitive impairment is significant only for mildly elevated homocystine of 17.0.. EEG on 02/21/15 was normal. ROS:   14 system review of systems is positive for  memory loss, confusion,  upset stomach change in appetite and all other systems negative PMH:  Past Medical History  Diagnosis Date  . Memory loss   . Altered mental status   . Hypertension   . Seizures     years ago  . Confusion     Social History:  Social History   Social History  . Marital Status: Single    Spouse Name: N/A  . Number of Children: 0  . Years of Education: 7   Occupational History  . 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 Smoking status: Never Smoker   . Smokeless tobacco: Not on file  . Alcohol Use: Yes     Comment: 6 beers daily  . Drug Use: No  . Sexual Activity: Not on file   Other Topics Concern  . Not on file   Social History Narrative  Patient lives with nephew Thomas [name])   Right handed   5 brothers, 3 sisters   caffeine use - coffee 1 cup daily    Medications:   Current Outpatient Prescriptions on File Prior to Visit  Medication Sig Dispense Refill  . amLODipine (NORVASC) 5 MG tablet Take 5 mg by mouth daily.    Marland Kitchen aspirin 81 MG tablet Take 81 mg by mouth daily.    . bisoprolol-hydrochlorothiazide (ZIAC) 2.5-6.25 MG per tablet   6   No current facility-administered medications on file prior to visit.    Allergies:  No Known Allergies  Physical Exam General: well developed, well nourished middle-aged African-American male, seated, in no evident distress Head: head normocephalic and atraumatic.   Neck: supple with no  carotid or supraclavicular bruits Cardiovascular: regular rate and rhythm, no murmurs Musculoskeletal: Old small left temporal bony deformity from old injury Skin:  no rash/petichiae Vascular:  Normal pulses all extremities  Neurologic Exam Mental Status: Awake and fully alert. Oriented to place and time. Recent and remote memory poor. Attention span, concentration and fund of knowledge diminished. Mini-Mental status exam not done today.. Mood and affect appropriate.  Cranial Nerves: Fundoscopic exam reveals sharp disc margins. Pupils equal, briskly reactive to light. Extraocular movements full without nystagmus but show exotropia   of the right eye.. Visual fields full to confrontation. Hearing intact. Facial sensation intact. Face, tongue, palate moves normally and symmetrically.  Motor: Normal bulk and tone. Normal strength in all tested extremity muscles. Sensory.: intact to touch , pinprick , position and vibratory sensation.  Coordination: Rapid alternating movements normal in all extremities. Finger-to-nose and heel-to-shin performed accurately bilaterally. Gait and Station: Arises from chair without difficulty. Stance is normal. Gait demonstrates normal stride length and balance . Able to heel, toe and tandem walk without difficulty.  Reflexes: 1+ and symmetric. Toes downgoing.       ASSESSMENT: 25 year African-American male with one year history of progressive memory loss likely from mild dementia versus   mild cognitive impairment in a  patient who at baseline has low cognition. Chronic alcohol abuse may also contribute to his cognitive impairment. He was unable to tolerate Aricept due to GI side effects    PLAN: I had a long discussion with the patient and his son regarding his dementia and personally reviewed imaging studies, lab, EEG results as well as discussed these with them and answered questions. I recommend he start taking Folic acid 1 mg tablet daily for his elevated  homocysteine as well as try Exelon patch 4.6 mg/24 hours and if tolerated will increase later. I complimented him on cutting back his alcohol intake and advised him to quit it completely. Greater than 50% of time of this 25 minute visit was spent on counseling and coordination of care. Return for follow-up in 2 months or call earlier if necessary. Delia Heady, MD Note: This document was prepared with digital dictation and possible smart phrase technology. Any transcriptional errors that result from this process are unintentional.

## 2015-05-06 NOTE — Patient Instructions (Signed)
I had a long discussion with the patient and his son regarding his dementia and personally reviewed imaging studies, lab, EEG results as well as discussed these with them and answered questions. I recommend he start taking Folic acid 1 mg tablet daily for his elevated homocysteine as well as try Exelon patch 4.6 mg/24 hours and if tolerated will increase later. I complimented him on cutting back his alcohol intake and advised him to quit it completely. Greater than 50% of time of this 25 minute visit was spent on counseling and coordination of care. Return for follow-up in 2 months or call earlier if necessary.

## 2015-07-04 ENCOUNTER — Ambulatory Visit: Payer: Medicare Other | Admitting: Neurology

## 2015-09-10 ENCOUNTER — Ambulatory Visit: Payer: Medicare Other | Admitting: Diagnostic Neuroimaging

## 2015-09-25 ENCOUNTER — Ambulatory Visit: Payer: Medicare Other | Admitting: Neurology

## 2015-09-30 ENCOUNTER — Encounter: Payer: Self-pay | Admitting: Neurology

## 2015-10-09 DIAGNOSIS — H5340 Unspecified visual field defects: Secondary | ICD-10-CM | POA: Diagnosis not present

## 2015-10-09 DIAGNOSIS — H40013 Open angle with borderline findings, low risk, bilateral: Secondary | ICD-10-CM | POA: Diagnosis not present

## 2015-10-09 DIAGNOSIS — H3589 Other specified retinal disorders: Secondary | ICD-10-CM | POA: Diagnosis not present

## 2015-10-09 DIAGNOSIS — H2513 Age-related nuclear cataract, bilateral: Secondary | ICD-10-CM | POA: Diagnosis not present

## 2015-11-04 ENCOUNTER — Ambulatory Visit (INDEPENDENT_AMBULATORY_CARE_PROVIDER_SITE_OTHER): Payer: Medicare Other | Admitting: Neurology

## 2015-11-04 ENCOUNTER — Encounter: Payer: Self-pay | Admitting: Neurology

## 2015-11-04 VITALS — BP 171/99 | HR 59 | Ht 68.0 in | Wt 184.8 lb

## 2015-11-04 DIAGNOSIS — F0390 Unspecified dementia without behavioral disturbance: Secondary | ICD-10-CM | POA: Diagnosis not present

## 2015-11-04 DIAGNOSIS — R413 Other amnesia: Secondary | ICD-10-CM

## 2015-11-04 NOTE — Patient Instructions (Signed)
I had a long discussion with the patient and his nephew regarding his mild dementia which appears to be stable but patient appears to be quite sensitive to Aricept and Exelon and has discontinued them and hence we will not pursue these medications further. The patient needs 24-hour supervision which he has. We will not suggest further testing or new medication changes at the present time. He'll return for follow-up in 6 months with Latrelle Dodrill, nurse practitioner call earlier if necessary.

## 2015-11-04 NOTE — Progress Notes (Signed)
[facility] [address] [place]. [place] [address] [phone]       OFFICE FOLLOW UP VISIT NOTE  [name] Date of Birth:  11-Jan-1951 Medical Record Number:  [id number]   Referring MD:  Shawnie Dapper, PA-C  Reason for Referral: memory loss  HPI:  Initial Consult 02/14/2015 :50 year African-American male who is accompanied by his nephew who provides most of the history. Patient has had some progressive mild memory difficulties for the last 10 months or so. He forgets recent activities that he was involved in and gets distracted. He has left the stove on a couple of times. He often forgets if door is locked or not. There are times when he can stop in mid sentence and search for words and even forget the names of family members. He lives with his nephew but is quite independent in activities of daily living except his not very well organized. 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 He is now retired. There is a history of stroke a few years ago but neither the patient or nephew are able to give me any details but he apparently had no residual deficits from it. He does have a history of heavy alcohol use but he has cut back in the last few years but still drinks 6 packs of beer per day. There is no definite history of Alzheimer's in the family with his mother who had stroke did have some memory difficulties. There is no history of seizures, falls, loss of consciousness or significant head injury. He has not had any evaluation for memory loss or recent brain imaging studies done. Update 05/06/2015 : He returns for follow-up after last visit 2 and of months ago. He reports no change in his memory and cognitive difficulties with unchanged. He was unable to tolerate Aricept and stopped it within a few days due to upset stomach. He  has however cut back alcohol intake now to 4-5 beers per week. Patient is accompanied by son who provides most of the history. Patient did undergo MRI scan of the brain on 02/19/15 which had ordered with and personally reviewed shows mild generalized atrophy but no significant abnormalities. Lab work on 5/27 /16 for reversible causes of cognitive impairment is significant only for mildly elevated homocystine of 17.0.. EEG on 02/21/15 was normal. Update 11/04/2015 : He returns for follow-up after last visit 6 months ago. He is a complaint by his nephew who provides most of the history. Patient took Exelon patch which I prescribed at last visit for Thomas [name] a week and discontinue it as it was irritating the skin. He remains cognitively stable. He still needs 24-hour supervision. He can take his own medications and ambulates without assistance. He however needs appointments with his financial matters as well as with keeping doctor's appointments. He had previously had trouble tolerating Aricept. He has had no new health problems since last visit. There have been no concerns about agitation, hallucinations, delusions or safety concerns ROS:   14 system review of systems is positive for  memory loss, confusion,  and all other systems negative PMH:  Past Medical History  Diagnosis Date  . Memory loss   . Altered mental status   . Hypertension   . Seizures (HCC)     years ago  . Confusion  Social History:  Social History   Social History  . Marital Status: Single    Spouse Name: N/A  . Number of Children: 0  . Years of Education: 7   Occupational History  . 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 Smoking status: Never Smoker   . Smokeless tobacco: Not on file  . Alcohol Use: Yes     Comment: 2 to 3 beers daily  . Drug Use: No  . Sexual Activity: Not on file   Other Topics Concern  . Not on file   Social History Narrative   Patient lives with nephew Thomas [name])    Right handed   5 brothers, 3 sisters   caffeine use - coffee 1 cup daily    Medications:   Current Outpatient Prescriptions on File Prior to Visit  Medication Sig Dispense Refill  . amLODipine (NORVASC) 5 MG tablet Take 5 mg by mouth daily.    Marland Kitchen aspirin 81 MG tablet Take 81 mg by mouth daily.    . bisoprolol-hydrochlorothiazide (ZIAC) 2.5-6.25 MG per tablet   6  . folic acid (FOLVITE) 1 MG tablet Take 1 tablet (1 mg total) by mouth daily. 60 tablet 1  . rivastigmine (EXELON) 4.6 mg/24hr Place 1 patch (4.6 mg total) onto the skin daily. (Patient not taking: Reported on 11/04/2015) 30 patch 12   No current facility-administered medications on file prior to visit.    Allergies:  No Known Allergies  Physical Exam General: well developed, well nourished middle-aged African-American male, seated, in no evident distress Head: head normocephalic and atraumatic.   Neck: supple with no carotid or supraclavicular bruits Cardiovascular: regular rate and rhythm, no murmurs Musculoskeletal: Old small left temporal bony deformity from old injury Skin:  no rash/petichiae Vascular:  Normal pulses all extremities  Neurologic Exam Mental Status: Awake and fully alert. Oriented to place and time. Recent and remote memory poor. Attention span, concentration and fund of knowledge diminished. Mini-Mental status exam not done today.. Mood and affect appropriate.  Cranial Nerves: Fundoscopic exam not done  . Pupils equal, briskly reactive to light. Extraocular movements full without nystagmus but show exotropia   of the right eye.. Visual fields full to confrontation. Hearing intact. Facial sensation intact. Face, tongue, palate moves normally and symmetrically.  Motor: Normal bulk and tone. Normal strength in all tested extremity muscles. Sensory.: intact to touch , pinprick , position and vibratory sensation.  Coordination: Rapid alternating movements normal in all extremities. Finger-to-nose and  heel-to-shin performed accurately bilaterally. Gait and Station: Arises from chair without difficulty. Stance is normal. Gait demonstrates normal stride length and balance . Able to heel, toe and tandem walk without difficulty.  Reflexes: 1+ and symmetric. Toes downgoing.       ASSESSMENT: 21 year African-American male with one year history of progressive memory loss likely from mild dementia versus   mild cognitive impairment in a  patient who at baseline has low cognition. Chronic alcohol abuse may also contribute to his cognitive impairment. He was unable to tolerate Aricept due to GI side effects and Exelon due to skin irritation    PLAN: I had a long discussion with the patient and his nephew regarding his mild dementia which appears to be stable but patient appears to be quite sensitive to Aricept and Exelon and has discontinued them and hence we will not pursue these medications further. The patient needs 24-hour supervision which he has. We will not suggest  further testing or new medication changes at the present time. He'll return for follow-up in 6 months with Latrelle Dodrill, nurse practitioner call earlier if necessary. Delia Heady, MD Note: This document was prepared with digital dictation and possible smart phrase technology. Any transcriptional errors that result from this process are unintentional.

## 2015-11-17 DIAGNOSIS — Z0000 Encounter for general adult medical examination without abnormal findings: Secondary | ICD-10-CM | POA: Diagnosis not present

## 2015-11-17 DIAGNOSIS — R351 Nocturia: Secondary | ICD-10-CM | POA: Diagnosis not present

## 2015-11-17 DIAGNOSIS — N401 Enlarged prostate with lower urinary tract symptoms: Secondary | ICD-10-CM | POA: Diagnosis not present

## 2015-11-17 DIAGNOSIS — R972 Elevated prostate specific antigen [PSA]: Secondary | ICD-10-CM | POA: Diagnosis not present

## 2015-11-17 DIAGNOSIS — N138 Other obstructive and reflux uropathy: Secondary | ICD-10-CM | POA: Diagnosis not present

## 2015-12-26 DIAGNOSIS — Z6829 Body mass index (BMI) 29.0-29.9, adult: Secondary | ICD-10-CM | POA: Diagnosis not present

## 2015-12-26 DIAGNOSIS — E663 Overweight: Secondary | ICD-10-CM | POA: Diagnosis not present

## 2015-12-26 DIAGNOSIS — Z1389 Encounter for screening for other disorder: Secondary | ICD-10-CM | POA: Diagnosis not present

## 2015-12-26 DIAGNOSIS — I10 Essential (primary) hypertension: Secondary | ICD-10-CM | POA: Diagnosis not present

## 2016-03-24 DIAGNOSIS — E6609 Other obesity due to excess calories: Secondary | ICD-10-CM | POA: Diagnosis not present

## 2016-03-24 DIAGNOSIS — Z1389 Encounter for screening for other disorder: Secondary | ICD-10-CM | POA: Diagnosis not present

## 2016-03-24 DIAGNOSIS — Z6830 Body mass index (BMI) 30.0-30.9, adult: Secondary | ICD-10-CM | POA: Diagnosis not present

## 2016-03-24 DIAGNOSIS — I10 Essential (primary) hypertension: Secondary | ICD-10-CM | POA: Diagnosis not present

## 2016-03-24 DIAGNOSIS — Z0000 Encounter for general adult medical examination without abnormal findings: Secondary | ICD-10-CM | POA: Diagnosis not present

## 2016-05-03 ENCOUNTER — Ambulatory Visit: Payer: Medicare Other | Admitting: Nurse Practitioner

## 2016-07-21 DIAGNOSIS — Z6830 Body mass index (BMI) 30.0-30.9, adult: Secondary | ICD-10-CM | POA: Diagnosis not present

## 2016-07-21 DIAGNOSIS — N400 Enlarged prostate without lower urinary tract symptoms: Secondary | ICD-10-CM | POA: Diagnosis not present

## 2016-07-21 DIAGNOSIS — R0789 Other chest pain: Secondary | ICD-10-CM | POA: Diagnosis not present

## 2016-07-21 DIAGNOSIS — Z23 Encounter for immunization: Secondary | ICD-10-CM | POA: Diagnosis not present

## 2016-07-21 DIAGNOSIS — I10 Essential (primary) hypertension: Secondary | ICD-10-CM | POA: Diagnosis not present

## 2016-07-28 ENCOUNTER — Encounter: Payer: Self-pay | Admitting: Nurse Practitioner

## 2016-07-28 ENCOUNTER — Ambulatory Visit (INDEPENDENT_AMBULATORY_CARE_PROVIDER_SITE_OTHER): Payer: Medicare Other | Admitting: Nurse Practitioner

## 2016-07-28 VITALS — BP 173/95 | HR 71 | Ht 68.0 in | Wt 187.4 lb

## 2016-07-28 DIAGNOSIS — R413 Other amnesia: Secondary | ICD-10-CM

## 2016-07-28 DIAGNOSIS — F0390 Unspecified dementia without behavioral disturbance: Secondary | ICD-10-CM

## 2016-07-28 NOTE — Patient Instructions (Signed)
Mild dementia which appears to be stable  patient appears to be quite sensitive to Aricept and Exelon and has discontinued them due to side effects. The patient needs 24-hour supervision which he has.  No further  testing or new medication changes at the present time.  Continue exercise for over all health and well being Follow up yearly and prn

## 2016-07-28 NOTE — Progress Notes (Signed)
[facility]  PATIENT: Thomas [name] DOB: 04/26/1951   REASON FOR VISIT: Dementia without behavioral disturbance HISTORY FROM: Patient and nephew Thomas [name]    HISTORY OF PRESENT ILLNESS:Initial Consult 5/27/2016PS :[age] year African-American male who is accompanied by his nephew who provides most of the history. Patient has had some progressive mild memory difficulties for the last 10 months or so. He forgets recent activities that he was involved in and gets distracted. He has left the stove on a couple of times. He often forgets if door is locked or not. There are times when he can stop in mid sentence and search for words and even forget the names of family members. He lives with his nephew but is quite independent in activities of daily living except his not very well organized. 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 He is now retired. There is a history of stroke a few years ago but neither the patient or nephew are able to give me any details but he apparently had no residual deficits from it. He does have a history of heavy alcohol use but he has cut back in the last few years but still drinks 6 packs of beer per day. There is no definite history of Alzheimer's in the family with his mother who had stroke did have some memory difficulties. There is no history of seizures, falls, loss of consciousness or significant head injury. He has not had any evaluation for memory loss or recent brain imaging studies done. Update 8/16/2016PS : He returns for follow-up after last visit 2 and of months ago. He reports no change in his memory and cognitive difficulties with unchanged. He was unable to tolerate Aricept and stopped it within a few days due to upset stomach. He has however cut back alcohol intake now to 4-5 beers per week. Patient is  accompanied by son who provides most of the history. Patient did undergo MRI scan of the brain on 02/19/15 which had ordered with and personally reviewed shows mild generalized atrophy but no significant abnormalities. Lab work on 5/27 /16 for reversible causes of cognitive impairment is significant only for mildly elevated homocystine of 17.0.. EEG on 02/21/15 was normal. Update 2/14/2017PS : He returns for follow-up after last visit 6 months ago. He is a complaint by his nephew who provides most of the history. Patient took Exelon patch which I prescribed at last visit for [name] a week and discontinue it as it was irritating the skin. He remains cognitively stable. He still needs 24-hour supervision. He can take his own medications and ambulates without assistance. He however needs appointments with his financial matters as well as with keeping doctor's appointments. He had previously had trouble tolerating Aricept. He has had no new health problems since last visit. There have been no concerns about agitation, hallucinations, delusions or safety concerns UPDATE 11/08/2017CM [name], 65 year old male returns for follow-up with his nephew [name]. He has a history of dementia which is stable. He has failed Exelon patch due to skin irritation and Aricept in the past due to upset stomach. He has 24-hour supervision. There have been no safety issues identified. He continues to get out and walk everyday and visit friends in the neighborhood. He says he is not drinking alcohol now. There has been no hallucinations ,  agitation,  or wandering behavior. His blood pressure been elevated recently and he just had an increase in his medication by his primary care. Blood pressure in the office today 173/95. He was encouraged to be compliant with this medication. Nephew says he has a blood pressure cuff at home. He was encouraged to take it periodically and take the log to his primary care at next visit. Nephew does his  finances. MRI scan of the brain 02/19/2015 shows mild generalized atrophy and no significant abnormalities. He returns for reevaluation  REVIEW OF SYSTEMS: Full 14 system review of systems performed and notable only for those listed, all others are neg:  Constitutional: neg  Cardiovascular: neg Ear/Nose/Throat: neg  Skin: neg Eyes: neg Respiratory: neg Gastroitestinal: neg  Hematology/Lymphatic: neg  Endocrine: neg Musculoskeletal:neg Allergy/Immunology: neg Neurological: Occasional headache Psychiatric: neg Sleep : neg   ALLERGIES: No Known Allergies  HOME MEDICATIONS: Outpatient Medications Prior to Visit  Medication Sig Dispense Refill  . amLODipine (NORVASC) 5 MG tablet Take 5 mg by mouth daily.    Marland Kitchen. aspirin 81 MG tablet Take 81 mg by mouth daily.    . folic acid (FOLVITE) 1 MG tablet Take 1 tablet (1 mg total) by mouth daily. 60 tablet 1  . bisoprolol-hydrochlorothiazide (ZIAC) 2.5-6.25 MG per tablet   6  . rivastigmine (EXELON) 4.6 mg/24hr Place 1 patch (4.6 mg total) onto the skin daily. (Patient not taking: Reported on 07/28/2016) 30 patch 12   No facility-administered medications prior to visit.     PAST MEDICAL HISTORY: Past Medical History:  Diagnosis Date  . Altered mental status   . Confusion   . Hypertension   . Memory loss   . Seizures (HCC)    years ago    PAST SURGICAL HISTORY: No past surgical history on file.  FAMILY HISTORY: Family History  Problem Relation Age of Onset  . Stroke Mother   . Hypertension Sister   . Diabetes Brother     x 2    SOCIAL HISTORY: Social History   Social History  . Marital status: Single    Spouse name: N/A  . Number of children: 0  . Years of education: 7   Occupational History  . 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 Smoking status: Never Smoker  . Smokeless tobacco: Not on file  . Alcohol use Yes     Comment: 2 to 3 beers daily  . Drug use: No  . Sexual activity: Not on file    Other Topics Concern  . Not on file   Social History Narrative   Patient lives with nephew Thomas [name](Thomas [name])   Right handed   5 brothers, 3 sisters   caffeine use - coffee 1 cup daily     PHYSICAL EXAM  Vitals:   07/28/16 1243  BP: (!) 173/95  Pulse: 71  Weight: 187 lb 6.4 oz (85 kg)  Height: 5\' 8"  (1.727 m)   Body mass index is 28.49 kg/m. General: well developed, well nourished middle-aged African-American male, seated, in no evident distress Head: head normocephalic and atraumatic.   Neck: supple with no carotid  bruits Cardiovascular: regular rate and rhythm, no murmurs Musculoskeletal: Old small left temporal bony deformity from old injury Skin:  no rash/petichiae Vascular:  Normal pulses all extremities   Neurological examination  Mental Status: Awake and fully alert. Oriented to place and time. Recent and remote memory poor. Attention span, concentration and fund of knowledge diminished.  Mini-Mental status exam not done today.. Mood and affect appropriate.  Cranial Nerves:  Pupils equal, briskly reactive to light. Extraocular movements full without nystagmus but show exotropia   of the right eye.. Visual fields full to confrontation. Hearing intact. Facial sensation intact. Face, tongue, palate moves normally and symmetrically.  Motor: Normal bulk and tone. Normal strength in all tested extremity muscles. Sensory.: intact to touch , pinprick , position and vibratory sensation in the upper and lower extremities.  Coordination: Rapid alternating movements normal in all extremities. Finger-to-nose and heel-to-shin performed accurately bilaterally. Gait and Station: Arises from chair without difficulty. Stance is normal. Gait demonstrates normal stride length and balance . Able to heel, toe and tandem walk without difficulty.  Reflexes: 1+ and symmetric. Toes downgoing.  DIAGNOSTIC DATA (LABS, IMAGING, TESTING)   ASSESSMENT AND PLAN 79 year African-American male with a  1.5  year history of progressive memory loss likely from mild dementia versus   mild cognitive impairment in a  patient who at baseline has low cognition. Chronic alcohol abuse may also contribute to his cognitive impairment. He was unable to tolerate Aricept due to GI side effects and Exelon due to skin irritation.Patient's nephew does not wish to add other medications at this time  PLAN: Mild dementia which appears to be stable  patient appears to be quite sensitive to Aricept and Exelon and has discontinued them due to side effects. The patient needs 24-hour supervision which he has.  No further  testing or new medication changes at the present time.  Continue exercise for over all health and well being Follow up yearly and prn Nilda Riggs, [facility], [facility], APRN  [facility] [address], [address] [place], [place] [address] [phone]

## 2016-07-29 NOTE — Progress Notes (Signed)
I agree with the above plan 

## 2016-08-09 ENCOUNTER — Emergency Department (HOSPITAL_COMMUNITY): Payer: Medicare Other

## 2016-08-09 ENCOUNTER — Encounter (HOSPITAL_COMMUNITY): Payer: Self-pay | Admitting: Emergency Medicine

## 2016-08-09 ENCOUNTER — Emergency Department (HOSPITAL_COMMUNITY)
Admission: EM | Admit: 2016-08-09 | Discharge: 2016-08-09 | Disposition: A | Discharge status: Home / Self Care | Payer: Medicare Other | Attending: Emergency Medicine | Admitting: Emergency Medicine

## 2016-08-09 DIAGNOSIS — Z7982 Long term (current) use of aspirin: Secondary | ICD-10-CM | POA: Insufficient documentation

## 2016-08-09 DIAGNOSIS — I10 Essential (primary) hypertension: Secondary | ICD-10-CM | POA: Diagnosis not present

## 2016-08-09 DIAGNOSIS — Z79899 Other long term (current) drug therapy: Secondary | ICD-10-CM | POA: Diagnosis not present

## 2016-08-09 DIAGNOSIS — R079 Chest pain, unspecified: Secondary | ICD-10-CM | POA: Insufficient documentation

## 2016-08-09 LAB — BASIC METABOLIC PANEL
Anion gap: 8 (ref 5–15)
BUN: 7 mg/dL (ref 6–20)
CALCIUM: 10.1 mg/dL (ref 8.9–10.3)
CO2: 30 mmol/L (ref 22–32)
CREATININE: 0.94 mg/dL (ref 0.61–1.24)
Chloride: 99 mmol/L — ABNORMAL LOW (ref 101–111)
GFR calc non Af Amer: >60 mL/min (ref >60)
Glucose, Bld: 101 mg/dL — ABNORMAL HIGH (ref 65–99)
Potassium: 4.3 mmol/L (ref 3.5–5.1)
Sodium: 137 mmol/L (ref 135–145)

## 2016-08-09 LAB — CBC
HCT: 46.2 % (ref 39.0–52.0)
Hemoglobin: 16.2 g/dL (ref 13.0–17.0)
MCH: 32.9 pg (ref 26.0–34.0)
MCHC: 35.1 g/dL (ref 30.0–36.0)
MCV: 93.7 fL (ref 78.0–100.0)
PLATELETS: 272 10*3/uL (ref 150–400)
RBC: 4.93 MIL/uL (ref 4.22–5.81)
RDW: 13 % (ref 11.5–15.5)
WBC: 9.1 10*3/uL (ref 4.0–10.5)

## 2016-08-09 LAB — TROPONIN I
TROPONIN I: 0.03 ng/mL — AB (ref <0.03)
Troponin I: 0.03 ng/mL (ref <0.03)

## 2016-08-09 MED ORDER — MORPHINE SULFATE (PF) 4 MG/ML IV SOLN
4.0000 mg | Freq: Once | INTRAVENOUS | Status: AC
  Administered 2016-08-09: 4 mg via INTRAVENOUS
  Filled 2016-08-09: qty 1

## 2016-08-09 MED ORDER — ONDANSETRON HCL 4 MG/2ML IJ SOLN
4.0000 mg | Freq: Once | INTRAMUSCULAR | Status: AC
  Administered 2016-08-09: 4 mg via INTRAVENOUS
  Filled 2016-08-09: qty 2

## 2016-08-09 MED ORDER — HYDROCODONE-ACETAMINOPHEN 5-325 MG PO TABS: 1.0000 | ORAL_TABLET | ORAL | 0 refills | Status: DC | PRN

## 2016-08-09 NOTE — ED Provider Notes (Addendum)
[facility]Y DEPT Provider Note   CSN: [id number] Arrival date & time: 08/09/16  1137     History   Chief Complaint Chief Complaint  Patient presents with  . Chest Pain    HPI Thomas [name] is a 65 y.o. male.  Level V caveat for memory loss. Intermittent chest pain since yesterday described as sharp. Symptoms come and go and last approximately 1-2 minutes and not associated with any activity. No diaphoresis, nausea, dyspnea. He has not drank alcohol in 5 weeks. Past medical history includes hypertension. No diabetes.      Past Medical History:  Diagnosis Date  . Altered mental status   . Confusion   . Hypertension   . Memory loss   . Seizures (HCC)    years ago    Patient Active Problem List   Diagnosis Date Noted  . Dementia 11/04/2015  . Altered mental status     History reviewed. No pertinent surgical history.     Home Medications    Prior to Admission medications   Medication Sig Start Date End Date Taking? Authorizing Provider  amLODipine (NORVASC) 5 MG tablet Take 5 mg by mouth daily.   Yes Historical Provider, MD  aspirin 81 MG tablet Take 81 mg by mouth daily.   Yes Historical Provider, MD  bisoprolol-hydrochlorothiazide (ZIAC) 5-6.25 MG tablet Take 1 tablet by mouth daily.  07/21/16  Yes Historical Provider, MD  HYDROcodone-acetaminophen (NORCO/VICODIN) 5-325 MG tablet Take 1 tablet by mouth every 4 (four) hours as needed. 08/09/16   Donnetta HutchingBrian Cheetara Hoge, MD    Family History Family History  Problem Relation Age of Onset  . Stroke Mother   . Hypertension Sister   . Diabetes Brother     x 2    Social History Social History  Substance Use Topics  . Smoking status: Never Smoker  . Smokeless tobacco: Never Used  . Alcohol use Yes     Comment: 2 to 3 beers daily     Allergies   Patient has no known allergies.   Review of Systems Review of Systems  Reason unable to perform ROS: Memory loss.     Physical Exam Updated Vital Signs BP  135/91   Pulse 60   Temp 97.6 F (36.4 C) (Oral)   Resp 19   Ht 5\' 8"  (1.727 m)   Wt 188 lb (85.3 kg)   SpO2 100%   BMI 28.59 kg/m   Physical Exam  Constitutional: He appears well-developed and well-nourished.  Pleasant, no acute distress  HENT:  Head: Normocephalic and atraumatic.  Eyes: Conjunctivae are normal.  Neck: Neck supple.  Cardiovascular: Normal rate and regular rhythm.   Pulmonary/Chest: Effort normal and breath sounds normal.  Abdominal: Soft. Bowel sounds are normal.  Musculoskeletal: Normal range of motion.  Neurological: He is alert.  Skin: Skin is warm and dry.  Psychiatric: He has a normal mood and affect. His behavior is normal.  Nursing note and vitals reviewed.    ED Treatments / Results  Labs (all labs ordered are listed, but only abnormal results are displayed) Labs Reviewed  BASIC METABOLIC PANEL - Abnormal; Notable for the following:       Result Value   Chloride 99 (*)    Glucose, Bld 101 (*)    All other components within normal limits  TROPONIN I - Abnormal; Notable for the following:    Troponin I 0.03 (*)    All other components within normal limits  TROPONIN I - Abnormal; Notable  for the following:    Troponin I 0.03 (*)    All other components within normal limits  CBC    EKG  EKG Interpretation  Date/Time:  Monday August 09 2016 11:39:04 EST Ventricular Rate:  72 PR Interval:  178 QRS Duration: 88 QT Interval:  396 QTC Calculation: 433 R Axis:   -55 Text Interpretation:  Sinus rhythm with Premature atrial complexes Left axis deviation Nonspecific T wave abnormality Abnormal ECG Confirmed by Adriana SimasOOK  MD, Zykeriah Mathia ([id number]) on 08/09/2016 2:24:53 PM       Radiology Dg Chest 2 View  Result Date: 08/09/2016 CLINICAL DATA:  Chest pain EXAM: CHEST  2 VIEW COMPARISON:  05/22/2010 FINDINGS: Normal heart size. Lungs clear. No pneumothorax. No pleural effusion. IMPRESSION: No active cardiopulmonary disease. Electronically Signed   By:  Jolaine ClickArthur  Hoss M.D.   On: 08/09/2016 12:33    Procedures Procedures (including critical care time)  Medications Ordered in ED Medications  ondansetron (ZOFRAN) injection 4 mg (4 mg Intravenous Given 08/09/16 1407)  morphine 4 MG/ML injection 4 mg (4 mg Intravenous Given 08/09/16 1407)     Initial Impression / Assessment and Plan / ED Course  I have reviewed the triage vital signs and the nursing notes.  Pertinent labs & imaging results that were available during my care of the patient were reviewed by me and considered in my medical decision making (see chart for details).  Clinical Course     History is atypical for cardiac chest pain. Symptoms are fleeting and short-lived. Troponin noted to be 0.03 times two different aliquots.  EKG shows no acute changes. Patient has appointment with cardiologist in 1 week. He understands to return if pain becomes worse and persistent.  Final Clinical Impressions(s) / ED Diagnoses   Final diagnoses:  Chest pain, unspecified type    New Prescriptions New Prescriptions   HYDROCODONE-ACETAMINOPHEN (NORCO/VICODIN) 5-325 MG TABLET    Take 1 tablet by mouth every 4 (four) hours as needed.     Donnetta HutchingBrian Tekela Garguilo, MD 08/09/16 1424    Donnetta HutchingBrian Talayah Picardi, MD 08/09/16 [phone]

## 2016-08-09 NOTE — Discharge Instructions (Signed)
If your chest pain becomes regular and sustained and does not go away after 10-15 minutes, return to the emergency department. Prescription for pain medicine. See the cardiologist next week.

## 2016-08-09 NOTE — ED Triage Notes (Signed)
Intermittent chest since yesterday, no other symptoms, states BP has been high

## 2016-08-09 NOTE — ED Notes (Signed)
CRITICAL VALUE ALERT  Critical value received:  Troponin 0.03  Date of notification:  08-09-16  Time of notification:  1336  Critical value read back:YES  Nurse who received alert:  Sharia ReeveLeigh Freddie Nghiem, RN  MD notified (1st page):  Adriana Simasook   Time of first page:  [phone]

## 2016-08-18 ENCOUNTER — Encounter: Payer: Self-pay | Admitting: Cardiology

## 2016-08-18 ENCOUNTER — Ambulatory Visit (INDEPENDENT_AMBULATORY_CARE_PROVIDER_SITE_OTHER): Payer: Medicare Other | Admitting: Cardiology

## 2016-08-18 VITALS — BP 194/101 | HR 62 | Ht 68.0 in | Wt 188.0 lb

## 2016-08-18 DIAGNOSIS — I10 Essential (primary) hypertension: Secondary | ICD-10-CM | POA: Diagnosis not present

## 2016-08-18 DIAGNOSIS — R0789 Other chest pain: Secondary | ICD-10-CM | POA: Diagnosis not present

## 2016-08-18 MED ORDER — AMLODIPINE BESYLATE 10 MG PO TABS: 10.0000 mg | ORAL_TABLET | Freq: Every day | ORAL | 1 refills | Status: DC

## 2016-08-18 NOTE — Progress Notes (Signed)
Clinical Summary Mr. Tiger is a 65 y.o.male seen as new patient. He is referred by PA Mann for the following medical problems.   1. Chest pain - recent ER visit with chest pain - symptoms thought to be atypical,  Enzymes .03 x 2 and EKG. CXR no acute process. Discharged from ER  - started 3 weeks ago. Sharp pain midchest, 4/10 in severity. Can occur at rest or with exertion. No other associated symptoms. Not positional. Lasts 4-5 seconds, occurring daily.  - no significant SOB or DOE. Highest level of activity is housework which he tolerates without troubles - stable in severity and frequency. No relation to food.   CAD risk factors: HTN, father had "enlarged heart", older brother CHF.    2. Dementia - followed by neurology  3. HTN - recent increase in bp meds last week.  - SBP 180s at home after change.   Past Medical History:  Diagnosis Date  . Altered mental status   . Confusion   . Hypertension   . Memory loss   . Seizures (HCC)    years ago     No Known Allergies   Current Outpatient Prescriptions  Medication Sig Dispense Refill  . amLODipine (NORVASC) 5 MG tablet Take 5 mg by mouth daily.    Marland Kitchen. aspirin 81 MG tablet Take 81 mg by mouth daily.    . bisoprolol-hydrochlorothiazide (ZIAC) 5-6.25 MG tablet Take 1 tablet by mouth daily.     Marland Kitchen. HYDROcodone-acetaminophen (NORCO/VICODIN) 5-325 MG tablet Take 1 tablet by mouth every 4 (four) hours as needed. 15 tablet 0   No current facility-administered medications for this visit.      No past surgical history on file.   No Known Allergies    Family History  Problem Relation Age of Onset  . Stroke Mother   . Hypertension Sister   . Diabetes Brother     x 2     Social History Mr. Detamore reports that he has never smoked. He has never used smokeless tobacco. [name] reports that he drinks alcohol.   Review of Systems CONSTITUTIONAL: No weight loss, fever, chills, weakness or fatigue.  HEENT: Eyes:  No visual loss, blurred vision, double vision or yellow sclerae.No hearing loss, sneezing, congestion, runny nose or sore throat.  SKIN: No rash or itching.  CARDIOVASCULAR: per hpi RESPIRATORY: No shortness of breath, cough or sputum.  GASTROINTESTINAL: No anorexia, nausea, vomiting or diarrhea. No abdominal pain or blood.  GENITOURINARY: No burning on urination, no polyuria NEUROLOGICAL: No headache, dizziness, syncope, paralysis, ataxia, numbness or tingling in the extremities. No change in bowel or bladder control.  MUSCULOSKELETAL: No muscle, back pain, joint pain or stiffness.  LYMPHATICS: No enlarged nodes. No history of splenectomy.  PSYCHIATRIC: No history of depression or anxiety.  ENDOCRINOLOGIC: No reports of sweating, cold or heat intolerance. No polyuria or polydipsia.  Marland Kitchen.   Physical Examination Vitals:   08/18/16 1331 08/18/16 1335  BP: (!) 186/92 (!) 194/101  Pulse: 63 62   Vitals:   08/18/16 1331  Weight: 188 lb (85.3 kg)  Height: 5\' 8"  (1.727 m)    Gen: resting comfortably, no acute distress HEENT: no scleral icterus, pupils equal round and reactive, no palptable cervical adenopathy,  CV: RRR, no m/r/g, no jvd Resp: Clear to auscultation bilaterally GI: abdomen is soft, non-tender, non-distended, normal bowel sounds, no hepatosplenomegaly MSK: extremities are warm, no edema.  Skin: warm, no rash Neuro:  no focal deficits Psych:  appropriate affect     Assessment and Plan  1. Chest pain - atypical for cardiac chest pain. Potentially related to his severely elevated bp's - will work to control bp's and follow symptoms, if persist consider ischemic testing at that time.   2. HTN - uncontrolled. Increase norvasc to 10mg  daily, submit bp log in 1 week   F/u 1 month    Antoine PocheJonathan F. Branch, M.D.

## 2016-08-18 NOTE — Patient Instructions (Addendum)
Medication Instructions:   Your physician has recommended you make the following change in your medication:   Increase amlodipine to 10 mg daily. You may take (2) of your 5 mg tablets daily until they are finished.  Continue all other medications the same.   Labwork: NONE  Testing/Procedures: NONE  Follow-Up:  Your physician recommends that you schedule a follow-up appointment in: 1 month.  Any Other Special Instructions Will Be Listed Below (If Applicable). Your physician has requested that you regularly monitor and record your blood pressure readings at home. Please use the same machine at the same time of day to check your readings and record them. Please call the office with your results.  If you need a refill on your cardiac medications before your next appointment, please call your pharmacy.

## 2016-09-24 DIAGNOSIS — Z1389 Encounter for screening for other disorder: Secondary | ICD-10-CM | POA: Diagnosis not present

## 2016-09-24 DIAGNOSIS — N400 Enlarged prostate without lower urinary tract symptoms: Secondary | ICD-10-CM | POA: Diagnosis not present

## 2016-09-24 DIAGNOSIS — Z23 Encounter for immunization: Secondary | ICD-10-CM | POA: Diagnosis not present

## 2016-09-24 DIAGNOSIS — R079 Chest pain, unspecified: Secondary | ICD-10-CM | POA: Diagnosis not present

## 2016-09-29 ENCOUNTER — Ambulatory Visit: Payer: Medicare Other | Admitting: Cardiology

## 2016-10-01 ENCOUNTER — Ambulatory Visit (INDEPENDENT_AMBULATORY_CARE_PROVIDER_SITE_OTHER): Payer: Medicare Other | Admitting: Cardiology

## 2016-10-01 ENCOUNTER — Encounter: Payer: Self-pay | Admitting: Cardiology

## 2016-10-01 VITALS — BP 125/72 | HR 63 | Ht 68.0 in | Wt 193.0 lb

## 2016-10-01 DIAGNOSIS — R0789 Other chest pain: Secondary | ICD-10-CM

## 2016-10-01 DIAGNOSIS — I10 Essential (primary) hypertension: Secondary | ICD-10-CM

## 2016-10-01 NOTE — Progress Notes (Signed)
Clinical Summary Thomas [name] is a 66 y.o.male seen today for follow up of the following medical problems.   1. Chest pain - previous ER visit with chest pain - symptoms thought to be atypical,  Enzymes .03 x 2 and EKG. CXR no acute process. Discharged from ER - started 3 weeks ago. Sharp pain midchest, 4/10 in severity. Can occur at rest or with exertion. No other associated symptoms. Not positional. Lasts 4-5 seconds, occurring daily.  - no significant SOB or DOE. Highest level of activity is housework which he tolerates without troubles - stable in severity and frequency. No relation to food.  CAD risk factors: HTN, father had "enlarged heart", older brother CHF.   - no recurrent symptoms since last visit. Seemed to have resolved with increaesd bp control  2. Dementia - followed by neurology  3. HTN  - last visit increased norvasc to 10mg  daily.  - home bp's around 120/70s since that time    Past Medical History:  Diagnosis Date  . Altered mental status   . Confusion   . Hypertension   . Memory loss   . Seizures (HCC)    years ago     No Known Allergies   Current Outpatient Prescriptions  Medication Sig Dispense Refill  . amLODipine (NORVASC) 10 MG tablet Take 1 tablet (10 mg total) by mouth daily. 90 tablet 1  . aspirin 81 MG tablet Take 81 mg by mouth daily.    . bisoprolol-hydrochlorothiazide (ZIAC) 5-6.25 MG tablet Take 1 tablet by mouth daily.     Marland Kitchen. HYDROcodone-acetaminophen (NORCO/VICODIN) 5-325 MG tablet Take 1 tablet by mouth every 4 (four) hours as needed. 15 tablet 0   No current facility-administered medications for this visit.      No past surgical history on file.   No Known Allergies    Family History  Problem Relation Age of Onset  . Stroke Mother   . Hypertension Sister   . Diabetes Brother     x 2     Social History Thomas [name] reports that he has never smoked. He has never used smokeless tobacco. Thomas [name] reports that he  drinks alcohol.   Review of Systems CONSTITUTIONAL: No weight loss, fever, chills, weakness or fatigue.  HEENT: Eyes: No visual loss, blurred vision, double vision or yellow sclerae.No hearing loss, sneezing, congestion, runny nose or sore throat.  SKIN: No rash or itching.  CARDIOVASCULAR: per hpi RESPIRATORY: No shortness of breath, cough or sputum.  GASTROINTESTINAL: No anorexia, nausea, vomiting or diarrhea. No abdominal pain or blood.  GENITOURINARY: No burning on urination, no polyuria NEUROLOGICAL: No headache, dizziness, syncope, paralysis, ataxia, numbness or tingling in the extremities. No change in bowel or bladder control.  MUSCULOSKELETAL: No muscle, back pain, joint pain or stiffness.  LYMPHATICS: No enlarged nodes. No history of splenectomy.  PSYCHIATRIC: No history of depression or anxiety.  ENDOCRINOLOGIC: No reports of sweating, cold or heat intolerance. No polyuria or polydipsia.  Marland Kitchen.   Physical Examination Vitals:   10/01/16 1310  BP: 125/72  Pulse: 63   Vitals:   10/01/16 1310  Weight: 193 lb (87.5 kg)  Height: 5\' 8"  (1.727 m)    Gen: resting comfortably, no acute distress HEENT: no scleral icterus, pupils equal round and reactive, no palptable cervical adenopathy,  CV: RRR, no m/r/g, no jvd Resp: Clear to auscultation bilaterally GI: abdomen is soft, non-tender, non-distended, normal bowel sounds, no hepatosplenomegaly MSK: extremities are warm, no edema.  Skin:  warm, no rash Neuro:  no focal deficits Psych: appropriate affect     Assessment and Plan  1. Chest pain - atypical for cardiac chest pain. Potentially related to his severely elevated bp's - symptoms have resolved. No indicaiton for ischemic tesitng at this time. Continue to monitor.   2. HTN -at goal since bp med change last visit - continue current meds   F/u 1 year      Thomas [name], M.D.

## 2016-10-01 NOTE — Patient Instructions (Signed)

## 2016-10-13 DIAGNOSIS — H5340 Unspecified visual field defects: Secondary | ICD-10-CM | POA: Diagnosis not present

## 2016-10-13 DIAGNOSIS — H40013 Open angle with borderline findings, low risk, bilateral: Secondary | ICD-10-CM | POA: Diagnosis not present

## 2016-10-13 DIAGNOSIS — H25813 Combined forms of age-related cataract, bilateral: Secondary | ICD-10-CM | POA: Diagnosis not present

## 2016-10-13 DIAGNOSIS — H3589 Other specified retinal disorders: Secondary | ICD-10-CM | POA: Diagnosis not present

## 2016-11-29 DIAGNOSIS — R972 Elevated prostate specific antigen [PSA]: Secondary | ICD-10-CM | POA: Diagnosis not present

## 2016-11-29 DIAGNOSIS — N400 Enlarged prostate without lower urinary tract symptoms: Secondary | ICD-10-CM | POA: Diagnosis not present

## 2016-12-16 ENCOUNTER — Encounter (HOSPITAL_COMMUNITY): Payer: Self-pay

## 2016-12-16 ENCOUNTER — Emergency Department (HOSPITAL_COMMUNITY)
Admission: EM | Admit: 2016-12-16 | Discharge: 2016-12-16 | Disposition: A | Discharge status: Home / Self Care | Payer: Medicare Other | Attending: Emergency Medicine | Admitting: Emergency Medicine

## 2016-12-16 ENCOUNTER — Emergency Department (HOSPITAL_COMMUNITY): Payer: Medicare Other

## 2016-12-16 DIAGNOSIS — R42 Dizziness and giddiness: Secondary | ICD-10-CM

## 2016-12-16 DIAGNOSIS — Z7982 Long term (current) use of aspirin: Secondary | ICD-10-CM | POA: Diagnosis not present

## 2016-12-16 DIAGNOSIS — I10 Essential (primary) hypertension: Secondary | ICD-10-CM | POA: Insufficient documentation

## 2016-12-16 DIAGNOSIS — R51 Headache: Secondary | ICD-10-CM | POA: Insufficient documentation

## 2016-12-16 DIAGNOSIS — R079 Chest pain, unspecified: Secondary | ICD-10-CM | POA: Diagnosis not present

## 2016-12-16 DIAGNOSIS — Z79899 Other long term (current) drug therapy: Secondary | ICD-10-CM | POA: Diagnosis not present

## 2016-12-16 LAB — CBC WITH DIFFERENTIAL/PLATELET
BASOS PCT: 0 %
Basophils Absolute: 0 10*3/uL (ref 0.0–0.1)
EOS PCT: 3 %
Eosinophils Absolute: 0.3 10*3/uL (ref 0.0–0.7)
HCT: 41.3 % (ref 39.0–52.0)
Hemoglobin: 14.9 g/dL (ref 13.0–17.0)
LYMPHS PCT: 39 %
Lymphs Abs: 3.1 10*3/uL (ref 0.7–4.0)
MCH: 32.3 pg (ref 26.0–34.0)
MCHC: 36.1 g/dL — ABNORMAL HIGH (ref 30.0–36.0)
MCV: 89.6 fL (ref 78.0–100.0)
MONO ABS: 0.5 10*3/uL (ref 0.1–1.0)
Monocytes Relative: 7 %
NEUTROS ABS: 4 10*3/uL (ref 1.7–7.7)
Neutrophils Relative %: 51 %
Platelets: 239 10*3/uL (ref 150–400)
RBC: 4.61 MIL/uL (ref 4.22–5.81)
RDW: 13.3 % (ref 11.5–15.5)
WBC: 7.9 10*3/uL (ref 4.0–10.5)

## 2016-12-16 LAB — BASIC METABOLIC PANEL
Anion gap: 8 (ref 5–15)
BUN: 9 mg/dL (ref 6–20)
CHLORIDE: 100 mmol/L — AB (ref 101–111)
CO2: 30 mmol/L (ref 22–32)
Calcium: 10 mg/dL (ref 8.9–10.3)
Creatinine, Ser: 1 mg/dL (ref 0.61–1.24)
GFR calc Af Amer: >60 mL/min (ref >60)
GFR calc non Af Amer: >60 mL/min (ref >60)
GLUCOSE: 105 mg/dL — AB (ref 65–99)
POTASSIUM: 3.2 mmol/L — AB (ref 3.5–5.1)
SODIUM: 138 mmol/L (ref 135–145)

## 2016-12-16 MED ORDER — MECLIZINE HCL 12.5 MG PO TABS
25.0000 mg | ORAL_TABLET | Freq: Once | ORAL | Status: AC
  Administered 2016-12-16: 25 mg via ORAL
  Filled 2016-12-16: qty 2

## 2016-12-16 MED ORDER — MECLIZINE HCL 25 MG PO TABS: 25.0000 mg | ORAL_TABLET | Freq: Three times a day (TID) | ORAL | 0 refills | Status: DC | PRN

## 2016-12-16 NOTE — Discharge Instructions (Signed)
Follow-up with the [facility]. Also follow-up with your neurologist at [facility] in [place]. Return for any new or worse symptoms. Take the meclizine as directed for the room spinning.

## 2016-12-16 NOTE — ED Triage Notes (Signed)
Patient states that he has been dizzy for the past 2-3 days.  States that he has a pounding headache that started 2-3 days ago.  Unsteady on his feet with ambulation.  Dizziness occurs when he is resting also.

## 2016-12-16 NOTE — ED Provider Notes (Signed)
[facility] DE[facility] Provider Note   CSN: [id number] Arrival date & time: 12/16/16  1944    By signing my name below, I, Valentino SaxonBianca Contreras, attest that this documentation has been prepared under the direction and in the presence of Vanetta MuldersScott Cornelious Diven, MD. Electronically Signed: Valentino SaxonBianca Contreras, ED Scribe. 12/16/16. 9:31 PM.  History   Chief Complaint Chief Complaint  Patient presents with  . Dizziness   The history is provided by the patient and a relative. No language interpreter was used.   HPI Comments: Sherlean FootLouis M Suits is a 66 y.o. male who presents to the Emergency Department complaining of gradual onset, intermittent, episodic dizziness that occurred three days ago. Pt notes he is not experiencing any dizziness at this time. He reports having a h/o of episodic dizziness with similar symptoms. He denies LOC. Pt reports associated "back and top" headache accompanied by visual changes. Pt describes his headache as a pounding sensation. Per nurse note, pt states his dizziness occurs with laying down as well. He also reports having intermittent generalized chest pain episodes, lasting less than a minute, that occur once a day. Pt's relative notes pt has had minimal difficulty ambulating. Per pt's relative, he states pt has an upcoming PCP f/u appointment on 04/02 at 9:45am. No alleviating factors noted. Pt denies fever, sore throat, cough, SOB, leg swelling, abdominal pain, diarrhea, nausea, vomiting, dysuria, hematuria, back pain, rash.   Past Medical History:  Diagnosis Date  . Altered mental status   . Confusion   . Hypertension   . Memory loss   . Seizures (HCC)    years ago    Patient Active Problem List   Diagnosis Date Noted  . Dementia 11/04/2015  . Altered mental status     Past Surgical History:  Procedure Laterality Date  . ABDOMINAL SURGERY     abdominal aneurysm        Home Medications    Prior to Admission medications   Medication Sig Start Date End Date  Taking? Authorizing Provider  amLODipine (NORVASC) 10 MG tablet Take 1 tablet (10 mg total) by mouth daily. 08/18/16 11/16/16  Antoine PocheJonathan F Branch, MD  aspirin 81 MG tablet Take 81 mg by mouth daily.    Historical Provider, MD  bisoprolol-hydrochlorothiazide (ZIAC) 5-6.25 MG tablet Take 1 tablet by mouth daily.  07/21/16   Historical Provider, MD  HYDROcodone-acetaminophen (NORCO/VICODIN) 5-325 MG tablet Take 1 tablet by mouth every 4 (four) hours as needed. 08/09/16   Donnetta HutchingBrian Cook, MD  meclizine (ANTIVERT) 25 MG tablet Take 1 tablet (25 mg total) by mouth 3 (three) times daily as needed for dizziness. 12/16/16   Vanetta MuldersScott Cannie Muckle, MD    Family History Family History  Problem Relation Age of Onset  . Stroke Mother   . Hypertension Sister   . Diabetes Brother     x 2    Social History Social History  Substance Use Topics  . Smoking status: Never Smoker  . Smokeless tobacco: Never Used  . Alcohol use Yes     Comment: 2 to 3 beers daily     Allergies   Patient has no known allergies.   Review of Systems Review of Systems  Constitutional: Negative for fever.  HENT: Negative for sore throat.   Eyes: Positive for visual disturbance.  Respiratory: Negative for cough and shortness of breath.   Cardiovascular: Positive for chest pain. Negative for leg swelling.  Gastrointestinal: Negative for abdominal pain, diarrhea, nausea and vomiting.  Genitourinary: Negative for dysuria and hematuria.  Musculoskeletal: Negative for back pain.  Skin: Negative for rash.  Neurological: Positive for dizziness and headaches. Negative for syncope.  Psychiatric/Behavioral: Negative for confusion.     Physical Exam Updated Vital Signs BP (!) 151/99 (BP Location: Left Arm)   Pulse 69   Temp 98.6 F (37 C) (Oral)   Resp 20   Ht 5\' 8"  (1.727 m)   Wt 86.2 kg   SpO2 95%   BMI 28.89 kg/m   Physical Exam  Constitutional: He appears well-developed and well-nourished.  HENT:  Head: Normocephalic and  atraumatic.  Mucous membranes are moist.   Eyes: Conjunctivae are normal. Right eye exhibits no discharge. Left eye exhibits no discharge.  Pupils are normal. Sclera is erythematous.    Cardiovascular: Normal rate and regular rhythm.   Pulmonary/Chest: Effort normal. No respiratory distress.  Lungs are clear bilaterally.   Abdominal: Bowel sounds are normal. There is no tenderness.  Belly is non-tender.   Neurological: He is alert. No cranial nerve deficit or sensory deficit. He exhibits normal muscle tone. Coordination normal.  Skin: Skin is warm and dry. No rash noted. He is not diaphoretic. No erythema.  Psychiatric: He has a normal mood and affect.  Nursing note and vitals reviewed.    ED Treatments / Results   DIAGNOSTIC STUDIES: Oxygen Saturation is 95% on RA, normal by my interpretation.    COORDINATION OF CARE: 9:12 PM Discussed treatment plan with pt at bedside which includes labs, head CT imaging, EKG and pt agreed to plan.   Labs (all labs ordered are listed, but only abnormal results are displayed) Labs Reviewed  CBC WITH DIFFERENTIAL/PLATELET - Abnormal; Notable for the following:       Result Value   MCHC 36.1 (*)    All other components within normal limits  BASIC METABOLIC PANEL - Abnormal; Notable for the following:    Potassium 3.2 (*)    Chloride 100 (*)    Glucose, Bld 105 (*)    All other components within normal limits    EKG  EKG Interpretation  Date/Time:  Thursday December 16 2016 19:52:35 EDT Ventricular Rate:  72 PR Interval:  178 QRS Duration: 90 QT Interval:  374 QTC Calculation: 409 R Axis:   124 Text Interpretation:  Sinus rhythm with occasional Premature ventricular complexes Right atrial enlargement Right axis deviation Nonspecific ST and T wave abnormality Abnormal ECG Confirmed by Madeleine Fenn  MD, Karesha Trzcinski [phone]) on 12/16/2016 9:17:56 PM       Radiology Ct Head Wo Contrast  Result Date: 12/16/2016 CLINICAL DATA:  Dizzy for 2-3 days,  pounding headache, unsteady on his feet when walking with additional dizziness at rest, history hypertension, dementia, remote history of seizures EXAM: CT HEAD WITHOUT CONTRAST TECHNIQUE: Contiguous axial images were obtained from the base of the skull through the vertex without intravenous contrast. Sagittal and coronal MPR images reconstructed from axial data set. COMPARISON:  08/30/2013 FINDINGS: Brain: Minimal atrophy. Normal ventricular morphology. No midline shift or mass effect. Otherwise normal appearance of brain parenchyma. No intracranial hemorrhage, mass lesion, or evidence acute infarction. No extra-axial fluid collections. Vascular: Unremarkable Skull: Intact Sinuses/Orbits: Clear Other: N/A IMPRESSION: No acute intracranial abnormalities. Electronically Signed   By: Ulyses Southward M.D.   On: 12/16/2016 20:41    Procedures Procedures (including critical care time)  Medications Ordered in ED Medications  meclizine (ANTIVERT) tablet 25 mg (25 mg Oral Given 12/16/16 2128)     Initial Impression / Assessment and Plan / ED Course  I have reviewed the triage vital signs and the nursing notes.  Pertinent labs & imaging results that were available during my care of the patient were reviewed by me and considered in my medical decision making (see chart for details).    Patient symptoms consistent with vertigo. Has had intermittent room spinning today. History of it in the past. Already followed by neurology in [facility] for other reasons. Patient received meclizine here with significant improvement. Workup including head CT and labs without significant abnormalities. Patient also had the complaint of a headache to the top of his head. It appears to be in no acute distress here. Symptoms have been present for 3 days. So would've expected some abnormal findings on head CT if stroke related. MRI currently not available. However patient is stable for follow-up with neurologist.   Final Clinical  Impressions(s) / ED Diagnoses   Final diagnoses:  Vertigo    New Prescriptions New Prescriptions   MECLIZINE (ANTIVERT) 25 MG TABLET    Take 1 tablet (25 mg total) by mouth 3 (three) times daily as needed for dizziness.   I personally performed the services described in this documentation, which was scribed in my presence. The recorded information has been reviewed and is accurate.       Vanetta Mulders, MD 12/16/16 2205

## 2016-12-21 DIAGNOSIS — R42 Dizziness and giddiness: Secondary | ICD-10-CM | POA: Diagnosis not present

## 2016-12-21 DIAGNOSIS — Z6832 Body mass index (BMI) 32.0-32.9, adult: Secondary | ICD-10-CM | POA: Diagnosis not present

## 2016-12-21 DIAGNOSIS — I10 Essential (primary) hypertension: Secondary | ICD-10-CM | POA: Diagnosis not present

## 2016-12-24 ENCOUNTER — Ambulatory Visit (INDEPENDENT_AMBULATORY_CARE_PROVIDER_SITE_OTHER): Payer: Medicare Other | Admitting: Cardiology

## 2016-12-24 ENCOUNTER — Encounter: Payer: Self-pay | Admitting: Cardiology

## 2016-12-24 VITALS — BP 124/77 | HR 59 | Ht 68.0 in | Wt 196.0 lb

## 2016-12-24 DIAGNOSIS — R0789 Other chest pain: Secondary | ICD-10-CM | POA: Diagnosis not present

## 2016-12-24 DIAGNOSIS — I10 Essential (primary) hypertension: Secondary | ICD-10-CM | POA: Diagnosis not present

## 2016-12-24 DIAGNOSIS — R42 Dizziness and giddiness: Secondary | ICD-10-CM | POA: Diagnosis not present

## 2016-12-24 MED ORDER — BISOPROLOL FUMARATE 10 MG PO TABS: 10.0000 mg | ORAL_TABLET | Freq: Every day | ORAL | 1 refills | Status: DC

## 2016-12-24 NOTE — Progress Notes (Signed)
Clinical Summary Thomas [name] is a 66 y.o.male seen today for follow up of the following medical problems.   1. Chest pain - previous ER visit with chest pain - symptoms thought to be atypical, Enzymes .03 x 2and EKG. CXR no acute process. Discharged from ER - started 3 weeks ago. Sharp pain midchest, 4/10 in severity. Can occur at rest or with exertion. No other associated symptoms. Not positional. Lasts 4-5 seconds, occurring daily.  - no significant SOB or DOE. Highest level of activity is housework which he tolerates without troubles - stable in severity and frequency. No relation to food.  CAD risk factors: HTN, father had "enlarged heart", older brother CHF.   - no recurrent symptoms since last visit. Seemed to have resolved with increaesd bp control  2. Dementia - followed by neurology  3. HTN  - last visit increased norvasc to  daily.  - home bp's are at goal around 120/70s    4. Headache/dizziness - 12/16/16 ER visit, CT head without acute changes - symptoms occur mainly with standing. Fairy limited oral hydration.    Past Medical History:  Diagnosis Date  . Altered mental status   . Confusion   . Hypertension   . Memory loss   . Seizures (HCC)    years ago     No Known Allergies   Current Outpatient Prescriptions  Medication Sig Dispense Refill  . amLODipine (NORVASC) 10 MG tablet Take 1 tablet (10 mg total) by mouth daily. 90 tablet 1  . aspirin 81 MG tablet Take 81 mg by mouth daily.    . bisoprolol-hydrochlorothiazide (ZIAC) 5-6.25 MG tablet Take 1 tablet by mouth daily.     Marland Kitchen HYDROcodone-acetaminophen (NORCO/VICODIN) 5-325 MG tablet Take 1 tablet by mouth every 4 (four) hours as needed. 15 tablet 0  . meclizine (ANTIVERT) 25 MG tablet Take 1 tablet (25 mg total) by mouth 3 (three) times daily as needed for dizziness. 30 tablet 0   No current facility-administered medications for this visit.      Past Surgical History:  Procedure  Laterality Date  . ABDOMINAL SURGERY     abdominal aneurysm      No Known Allergies    Family History  Problem Relation Age of Onset  . Stroke Mother   . Hypertension Sister   . Diabetes Brother     x 2     Social History Thomas [name] reports that he has never smoked. He has never used smokeless tobacco. Thomas [name] reports that he drinks alcohol.   Review of Systems CONSTITUTIONAL: No weight loss, fever, chills, weakness or fatigue.  HEENT: Eyes: No visual loss, blurred vision, double vision or yellow sclerae.No hearing loss, sneezing, congestion, runny nose or sore throat.  SKIN: No rash or itching.  CARDIOVASCULAR: per hpi RESPIRATORY: No shortness of breath, cough or sputum.  GASTROINTESTINAL: No anorexia, nausea, vomiting or diarrhea. No abdominal pain or blood.  GENITOURINARY: No burning on urination, no polyuria NEUROLOGICAL: No headache, dizziness, syncope, paralysis, ataxia, numbness or tingling in the extremities. No change in bowel or bladder control.  MUSCULOSKELETAL: No muscle, back pain, joint pain or stiffness.  LYMPHATICS: No enlarged nodes. No history of splenectomy.  PSYCHIATRIC: No history of depression or anxiety.  ENDOCRINOLOGIC: No reports of sweating, cold or heat intolerance. No polyuria or polydipsia.  Marland Kitchen   Physical Examination Vitals:   12/24/16 1354  BP: 124/77  Pulse: (!) 59   Vitals:   12/24/16 1354  Weight:  196 lb (88.9 kg)  Height:  (1.727 m)    Gen: resting comfortably, no acute distress HEENT: no scleral icterus, pupils equal round and reactive, no palptable cervical adenopathy,  CV: RRR, no mrg, no jvd Resp: Clear to auscultation bilaterally GI: abdomen is soft, non-tender, non-distended, normal bowel sounds, no hepatosplenomegaly MSK: extremities are warm, no edema.  Skin: warm, no rash Neuro:  no focal deficits Psych: appropriate affect      Assessment and Plan   1. Chest pain - no recent symptoms - continue to  monitor.   2. HTN -at goal Recent orthostatic symptoms. We will stop his diuretic  3. Dizziness - bordelrine orthostatic by pulse in clinic, we will d/c his HCTZ. Encouraged increased oral hydfration    F/u 3 months     Antoine Poche, M.D., F.A.C.C.

## 2016-12-24 NOTE — Patient Instructions (Addendum)
Your physician recommends that you schedule a follow-up appointment in: 3-4 WEEKS WITH DR [facility]   Your physician has recommended you make the following change in your medication:   STOP ZIAC   START BISOPROLOL 10 MG DAILY  Thank you for choosing [facility]!!

## 2016-12-29 ENCOUNTER — Emergency Department (HOSPITAL_COMMUNITY)
Admission: EM | Admit: 2016-12-29 | Discharge: 2016-12-29 | Disposition: A | Discharge status: Home / Self Care | Payer: Medicare Other | Attending: Emergency Medicine | Admitting: Emergency Medicine

## 2016-12-29 ENCOUNTER — Encounter (HOSPITAL_COMMUNITY): Payer: Self-pay | Admitting: Emergency Medicine

## 2016-12-29 DIAGNOSIS — Z7982 Long term (current) use of aspirin: Secondary | ICD-10-CM | POA: Diagnosis not present

## 2016-12-29 DIAGNOSIS — I10 Essential (primary) hypertension: Secondary | ICD-10-CM | POA: Diagnosis not present

## 2016-12-29 DIAGNOSIS — R42 Dizziness and giddiness: Secondary | ICD-10-CM | POA: Insufficient documentation

## 2016-12-29 DIAGNOSIS — Z79899 Other long term (current) drug therapy: Secondary | ICD-10-CM | POA: Insufficient documentation

## 2016-12-29 LAB — COMPREHENSIVE METABOLIC PANEL
ALBUMIN: 4.1 g/dL (ref 3.5–5.0)
ALT: 16 U/L — ABNORMAL LOW (ref 17–63)
ANION GAP: 8 (ref 5–15)
AST: 28 U/L (ref 15–41)
Alkaline Phosphatase: 83 U/L (ref 38–126)
BUN: 11 mg/dL (ref 6–20)
CHLORIDE: 101 mmol/L (ref 101–111)
CO2: 30 mmol/L (ref 22–32)
Calcium: 9.5 mg/dL (ref 8.9–10.3)
Creatinine, Ser: 0.91 mg/dL (ref 0.61–1.24)
GFR calc Af Amer: >60 mL/min (ref >60)
GFR calc non Af Amer: >60 mL/min (ref >60)
GLUCOSE: 100 mg/dL — AB (ref 65–99)
POTASSIUM: 3.6 mmol/L (ref 3.5–5.1)
SODIUM: 139 mmol/L (ref 135–145)
Total Bilirubin: 0.7 mg/dL (ref 0.3–1.2)
Total Protein: 8 g/dL (ref 6.5–8.1)

## 2016-12-29 LAB — CBC WITH DIFFERENTIAL/PLATELET
BASOS PCT: 1 %
Basophils Absolute: 0 10*3/uL (ref 0.0–0.1)
EOS ABS: 0.3 10*3/uL (ref 0.0–0.7)
Eosinophils Relative: 4 %
HCT: 42.2 % (ref 39.0–52.0)
HEMOGLOBIN: 14.7 g/dL (ref 13.0–17.0)
Lymphocytes Relative: 31 %
Lymphs Abs: 2.4 10*3/uL (ref 0.7–4.0)
MCH: 31.5 pg (ref 26.0–34.0)
MCHC: 34.8 g/dL (ref 30.0–36.0)
MCV: 90.4 fL (ref 78.0–100.0)
MONO ABS: 0.6 10*3/uL (ref 0.1–1.0)
Monocytes Relative: 7 %
NEUTROS PCT: 57 %
Neutro Abs: 4.4 10*3/uL (ref 1.7–7.7)
Platelets: 257 10*3/uL (ref 150–400)
RBC: 4.67 MIL/uL (ref 4.22–5.81)
RDW: 13.6 % (ref 11.5–15.5)
WBC: 7.7 10*3/uL (ref 4.0–10.5)

## 2016-12-29 MED ORDER — SODIUM CHLORIDE 0.9 % IV BOLUS (SEPSIS)
1000.0000 mL | Freq: Once | INTRAVENOUS | Status: AC
  Administered 2016-12-29: 1000 mL via INTRAVENOUS

## 2016-12-29 NOTE — ED Notes (Signed)
MD Zammit at bedside. 

## 2016-12-29 NOTE — Discharge Instructions (Signed)
Stop taking the bisoprolol-hctz,   continue the bisoprolol and norvasc

## 2016-12-29 NOTE — ED Provider Notes (Signed)
[facility]-EMERG[facility]Y DEPT Provider Note   CSN: [id number] Arrival date & time: 12/29/16  1339  By signing my name below, I, Clovis Pu, attest that this documentation has been prepared under the direction and in the presence of Bethann Berkshire, MD  Electronically Signed: Clovis Pu, ED Scribe. 12/29/16. 1:56 PM.   History   Chief Complaint Chief Complaint  Patient presents with  . Dizziness    HPI Comments:  Thomas [name] is a 66 y.o. male, with a PMHx of HTN and seizures,  who presents to the Emergency Department complaining of acute onset, moderate episodes of dizziness x 3 days. He notes his symptoms are worse when standing up and better with laying down. Pt was seen in the ED 3 weeks ago for similar symptoms and chest pain. After that visit the pt followed up with his cardiologist in [place], [place] who increased his medication dose. No other alleviating factors noted. Pt denies LOC or any other associated symptoms. No other complaints noted at this time.   The history is provided by the patient. No language interpreter was used.  Dizziness  Quality:  Lightheadedness Severity:  Moderate Onset quality:  Sudden Timing:  Intermittent Progression:  Unchanged Chronicity:  Recurrent Context: standing up   Relieved by:  Lying down Worsened by:  Standing up and sitting upright Ineffective treatments:  None tried Associated symptoms: no chest pain, no diarrhea and no headaches     Past Medical History:  Diagnosis Date  . Altered mental status   . Confusion   . Hypertension   . Memory loss   . Seizures (HCC)    years ago    Patient Active Problem List   Diagnosis Date Noted  . Dementia 11/04/2015  . Altered mental status     Past Surgical History:  Procedure Laterality Date  . ABDOMINAL SURGERY     abdominal aneurysm        Home Medications    Prior to Admission medications   Medication Sig Start Date End Date Taking? Authorizing Provider  amLODipine (NORVASC) 10 MG  tablet Take 10 mg by mouth daily.    Historical Provider, MD  aspirin 81 MG tablet Take 81 mg by mouth daily.    Historical Provider, MD  bisoprolol (ZEBETA) 10 MG tablet Take 1 tablet (10 mg total) by mouth daily. 12/24/16   Antoine Poche, MD  meclizine (ANTIVERT) 25 MG tablet Take 1 tablet (25 mg total) by mouth 3 (three) times daily as needed for dizziness. 12/16/16   Vanetta Mulders, MD    Family History Family History  Problem Relation Age of Onset  . Stroke Mother   . Hypertension Sister   . Diabetes Brother     x 2    Social History Social History  Substance Use Topics  . Smoking status: Never Smoker  . Smokeless tobacco: Never Used  . Alcohol use No     Comment: quit 6 months ago (12/29/2016)     Allergies   Patient has no known allergies.   Review of Systems Review of Systems  Constitutional: Negative for appetite change and fatigue.  HENT: Negative for congestion, ear discharge and sinus pressure.   Eyes: Negative for discharge.  Respiratory: Negative for cough.   Cardiovascular: Negative for chest pain.  Gastrointestinal: Negative for abdominal pain and diarrhea.  Genitourinary: Negative for frequency and hematuria.  Musculoskeletal: Negative for back pain.  Skin: Negative for rash.  Neurological: Positive for dizziness. Negative for seizures and headaches.  Psychiatric/Behavioral: Negative for hallucinations.     Physical Exam Updated Vital Signs BP (!) 144/95   Temp 98.1 F (36.7 C) (Oral)   Resp 14   Ht  (1.702 m)   Wt 180 lb (81.6 kg)   BMI 28.19 kg/m   Physical Exam  Constitutional: He is oriented to person, place, and time. He appears well-developed.  HENT:  Head: Normocephalic.  Eyes: Conjunctivae and EOM are normal. No scleral icterus.  Neck: Neck supple. No thyromegaly present.  Cardiovascular: Normal rate and regular rhythm.  Exam reveals no gallop and no friction rub.   No murmur heard. Pulmonary/Chest: No stridor. He has no  wheezes. He has no rales. He exhibits no tenderness.  Abdominal: He exhibits no distension. There is no tenderness. There is no rebound.  Musculoskeletal: Normal range of motion. He exhibits no edema.  Lymphadenopathy:    He has no cervical adenopathy.  Neurological: He is oriented to person, place, and time. He exhibits normal muscle tone. Coordination normal.  Skin: No rash noted. No erythema.  Psychiatric: He has a normal mood and affect. His behavior is normal.  Nursing note and vitals reviewed.    ED Treatments / Results  COORDINATION OF CARE:  1:53 PM Discussed treatment plan with pt at bedside and pt agreed to plan.  Labs (all labs ordered are listed, but only abnormal results are displayed) Labs Reviewed - No data to display  EKG  EKG Interpretation None       Radiology No results found.  Procedures Procedures (including critical care time)  Medications Ordered in ED Medications - No data to display   Initial Impression / Assessment and Plan / ED Course  I have reviewed the triage vital signs and the nursing notes.  Pertinent labs & imaging results that were available during my care of the patient were reviewed by me and considered in my medical decision making (see chart for details).    Patient complains of dizziness. Patient was just seen by his cardiologist and cardiologist decided to stop the hydrochlorothiazide. The patient was taken a beta blocker HCTZ combination. The cardiologist wrote for the beta blocker by itself. The patient ended up taking the beta blocker and a combination beta blocker and HCTZ by mistake. So he has continued to be dizzy. I explained what needed to be done. And he will stop the combination HCTZ and beta blocker. He is to follow-up with his cardiologist later  Final Clinical Impressions(s) / ED Diagnoses   Final diagnoses:  None    New Prescriptions New Prescriptions   No medications on file  The chart was scribed for me  under my direct supervision.  I personally performed the history, physical, and medical decision making and all procedures in the evaluation of this patient.Bethann Berkshire, MD 12/29/16 [phone]

## 2016-12-29 NOTE — ED Triage Notes (Signed)
Pt reports feeling "swimmy headed" for two days and as though the room is spinning. Family reports speech is slower than normal. Was seen for dizziness approx 3 weeks ago. Also states CP with exertion, denies CP at this time.

## 2017-01-07 ENCOUNTER — Emergency Department (HOSPITAL_COMMUNITY): Payer: Medicare Other

## 2017-01-07 ENCOUNTER — Encounter (HOSPITAL_COMMUNITY): Payer: Self-pay | Admitting: Emergency Medicine

## 2017-01-07 ENCOUNTER — Emergency Department (HOSPITAL_COMMUNITY)
Admission: EM | Admit: 2017-01-07 | Discharge: 2017-01-07 | Disposition: A | Discharge status: Home / Self Care | Payer: Medicare Other | Attending: Emergency Medicine | Admitting: Emergency Medicine

## 2017-01-07 DIAGNOSIS — Z7982 Long term (current) use of aspirin: Secondary | ICD-10-CM | POA: Diagnosis not present

## 2017-01-07 DIAGNOSIS — I10 Essential (primary) hypertension: Secondary | ICD-10-CM | POA: Insufficient documentation

## 2017-01-07 DIAGNOSIS — R42 Dizziness and giddiness: Secondary | ICD-10-CM | POA: Insufficient documentation

## 2017-01-07 DIAGNOSIS — Z79899 Other long term (current) drug therapy: Secondary | ICD-10-CM | POA: Insufficient documentation

## 2017-01-07 LAB — CBC WITH DIFFERENTIAL/PLATELET
Basophils Absolute: 0 10*3/uL (ref 0.0–0.1)
Basophils Relative: 0 %
EOS ABS: 0.1 10*3/uL (ref 0.0–0.7)
EOS PCT: 2 %
HCT: 41.7 % (ref 39.0–52.0)
Hemoglobin: 14.5 g/dL (ref 13.0–17.0)
LYMPHS ABS: 2.2 10*3/uL (ref 0.7–4.0)
LYMPHS PCT: 29 %
MCH: 31.6 pg (ref 26.0–34.0)
MCHC: 34.8 g/dL (ref 30.0–36.0)
MCV: 90.8 fL (ref 78.0–100.0)
MONO ABS: 0.4 10*3/uL (ref 0.1–1.0)
MONOS PCT: 6 %
Neutro Abs: 4.7 10*3/uL (ref 1.7–7.7)
Neutrophils Relative %: 63 %
PLATELETS: 238 10*3/uL (ref 150–400)
RBC: 4.59 MIL/uL (ref 4.22–5.81)
RDW: 13.9 % (ref 11.5–15.5)
WBC: 7.5 10*3/uL (ref 4.0–10.5)

## 2017-01-07 LAB — COMPREHENSIVE METABOLIC PANEL
ALBUMIN: 4.3 g/dL (ref 3.5–5.0)
ALT: 14 U/L — AB (ref 17–63)
AST: 28 U/L (ref 15–41)
Alkaline Phosphatase: 84 U/L (ref 38–126)
Anion gap: 10 (ref 5–15)
BUN: 12 mg/dL (ref 6–20)
CHLORIDE: 105 mmol/L (ref 101–111)
CO2: 29 mmol/L (ref 22–32)
CREATININE: 1.03 mg/dL (ref 0.61–1.24)
Calcium: 9.8 mg/dL (ref 8.9–10.3)
GFR calc Af Amer: >60 mL/min (ref >60)
GFR calc non Af Amer: >60 mL/min (ref >60)
GLUCOSE: 108 mg/dL — AB (ref 65–99)
POTASSIUM: 4.3 mmol/L (ref 3.5–5.1)
SODIUM: 144 mmol/L (ref 135–145)
Total Bilirubin: 0.4 mg/dL (ref 0.3–1.2)
Total Protein: 8.3 g/dL — ABNORMAL HIGH (ref 6.5–8.1)

## 2017-01-07 MED ORDER — MECLIZINE HCL 12.5 MG PO TABS
25.0000 mg | ORAL_TABLET | Freq: Once | ORAL | Status: AC
  Administered 2017-01-07: 25 mg via ORAL
  Filled 2017-01-07: qty 2

## 2017-01-07 MED ORDER — LORAZEPAM 0.5 MG PO TABS: 0.5000 mg | ORAL_TABLET | Freq: Four times a day (QID) | ORAL | 0 refills | Status: DC | PRN

## 2017-01-07 NOTE — ED Notes (Signed)
Patient transported to MRI 

## 2017-01-07 NOTE — ED Notes (Signed)
Brother - [phone] larry

## 2017-01-07 NOTE — ED Provider Notes (Signed)
[facility] Provider Note   CSN: [id number] Arrival date & time: 01/07/17  1222  By signing my name below, I, [name]., attest that this documentation has been prepared under the direction and in the presence of [name], MD. Electronically signed: [name]., ED Scribe. 01/07/17. 12:38 PM.   History   Chief Complaint Chief Complaint  Patient presents with  . Dizziness    HPI Thomas [name] is a 66 y.o. male with hx of HTN who presents to the Emergency Department complaining of dizziness with onset x1 week. Pt states that for the past x1 week he has had a pulsating sensation in his head with associated dizziness. He states that he was seen x2 weeks ago with the same complaint and at this time his blood pressure medication was changed. Pt states that when he lies down he does not feel the sensation but when he is active he feels the sensation and becomes dizzy. He denies chest pain, abdominal pain, fever, chills, nausea, vomiting, blood in stool and hematemesis. Pt denies smoking, alcohol use. But on repeat exam the patient acknowledges a long history of drinking substantial amounts of alcohol for years.    The history is provided by the patient. No language interpreter was used.    Past Medical History:  Diagnosis Date  . Altered mental status   . Confusion   . Hypertension   . Memory loss   . Seizures (HCC)    years ago    Patient Active Problem List   Diagnosis Date Noted  . Dementia 11/04/2015  . Altered mental status     Past Surgical History:  Procedure Laterality Date  . ABDOMINAL SURGERY     abdominal aneurysm        Home Medications    Prior to Admission medications   Medication Sig Start Date End Date Taking? Authorizing Provider  acetaminophen (TYLENOL) 500 MG tablet Take 500 mg by mouth every 6 (six) hours as needed for mild pain.    Historical Provider, MD  amLODipine (NORVASC) 10 MG tablet Take 10 mg by  mouth daily.    Historical Provider, MD  aspirin 81 MG tablet Take 81 mg by mouth daily.    Historical Provider, MD  bisoprolol (ZEBETA) 10 MG tablet Take 1 tablet (10 mg total) by mouth daily. 12/24/16   Antoine Poche, MD  bisoprolol-hydrochlorothiazide [facility]) 10-6.25 MG tablet Take 1 tablet by mouth daily.    Historical Provider, MD  meclizine (ANTIVERT) 25 MG tablet Take 1 tablet (25 mg total) by mouth 3 (three) times daily as needed for dizziness. Patient not taking: Reported on 12/29/2016 12/16/16   Vanetta Mulders, MD    Family History Family History  Problem Relation Age of Onset  . Stroke Mother   . Hypertension Sister   . Diabetes Brother     x 2    Social History Social History  Substance Use Topics  . Smoking status: Never Smoker  . Smokeless tobacco: Never Used  . Alcohol use No     Comment: quit 6 months ago (12/29/2016)     Allergies   Patient has no known allergies.   Review of Systems Review of Systems  Constitutional:       Per HPI, otherwise negative  HENT:       Per HPI, otherwise negative  Respiratory:       Per HPI, otherwise negative  Cardiovascular:       Per HPI,  otherwise negative  Gastrointestinal: Negative for vomiting.  Endocrine:       Negative aside from HPI  Genitourinary:       Neg aside from HPI   Musculoskeletal:       Per HPI, otherwise negative  Skin: Negative.   Neurological: Positive for dizziness. Negative for syncope.     Physical Exam Updated Vital Signs BP (!) 161/104 (BP Location: Right Arm)   Pulse 66   Temp 97.6 F (36.4 C) (Oral)   Resp 18   Ht  (1.702 m)   Wt 180 lb (81.6 kg)   SpO2 98%   BMI 28.19 kg/m   Physical Exam  Constitutional: He is oriented to person, place, and time. He appears well-developed. No distress.  HENT:  Head: Normocephalic and atraumatic.  Eyes: Conjunctivae and EOM are normal.  Cardiovascular: Normal rate and regular rhythm.   Pulmonary/Chest: Effort normal. No stridor. No  respiratory distress.  Abdominal: He exhibits no distension.  Musculoskeletal: He exhibits no edema.  Neurological: He is alert and oriented to person, place, and time. He displays atrophy. He displays no tremor. No cranial nerve deficit. He exhibits normal muscle tone. He displays no seizure activity. Coordination normal.  Skin: Skin is warm and dry.  Psychiatric: He has a normal mood and affect.  Nursing note and vitals reviewed.    ED Treatments / Results   DIAGNOSTIC STUDIES: Oxygen Saturation is 98% on RA, normal by my interpretation.   COORDINATION OF CARE: 12:38 PM-Discussed next steps with pt. Pt verbalized understanding and is agreeable with the plan.    Labs (all labs ordered are listed, but only abnormal results are displayed) Labs Reviewed  COMPREHENSIVE METABOLIC PANEL - Abnormal; Notable for the following:       Result Value   Glucose, Bld 108 (*)    Total Protein 8.3 (*)    ALT 14 (*)    All other components within normal limits  CBC WITH DIFFERENTIAL/PLATELET    EKG Sinus rhythm, rate 61, T-wave flattening, artifact, otherwise normal  Radiology Dg Chest 2 View  Result Date: 01/07/2017 CLINICAL DATA:  Disequilibrium EXAM: CHEST  2 VIEW COMPARISON:  08/09/2016 FINDINGS: The heart size and mediastinal contours are within normal limits. Both lungs are clear. The visualized skeletal structures are unremarkable. IMPRESSION: No active cardiopulmonary disease. Electronically Signed   By: Marlan Palau M.D.   On: 01/07/2017 14:02   [name] Contrast  Result Date: 01/07/2017 CLINICAL DATA:  66 year old male with new onset dizziness for 1 week. Associated pulsating sensation in the head. EXAM: MRI HEAD WITHOUT CONTRAST TECHNIQUE: Multiplanar, multiecho pulse sequences of the brain and surrounding structures were obtained without intravenous contrast. COMPARISON:  Head CT without contrast 12/16/2016. [facility] Brain MRI 02/19/2015. FINDINGS: Brain: Cerebral  volume is within normal limits for age. No restricted diffusion to suggest acute infarction. No midline shift, mass effect, evidence of mass lesion, ventriculomegaly, extra-axial collection or acute intracranial hemorrhage. Cervicomedullary junction and pituitary are within normal limits. Wallace Cullens and white matter signal remains normal for age throughout the brain. No encephalomalacia or chronic cerebral blood products identified. Vascular: Major intracranial vascular flow voids are stable since 2016. Skull and upper cervical spine: Chronic degenerative appearing cervical spinal stenosis at C2-C3 with some spinal cord mass effect. This appears not significantly changed since 2016 (series 2, image 11 today). Visualized bone marrow signal is within normal limits. Sinuses/Orbits: Disc conjugate gaze, but otherwise normal orbits soft tissues. Visualized paranasal sinuses and  mastoids are stable and well pneumatized. Other: Visible internal auditory structures appear normal. Negative scalp soft tissues. IMPRESSION: 1. Stable and normal for age noncontrast MRI appearance of the brain. 2. Chronic degenerative cervical spinal stenosis at C2-C3 with chronic spinal cord mass effect at that level. Electronically Signed   By: Odessa Fleming M.D.   On: 01/07/2017 13:55    Procedures Procedures (including critical care time)  EMR reviewed, notable for 2 evaluations here in the past month for similar concerns, as well as ongoing neurology evaluation. Patient had EEG, as in the past 24 months, as below  Impression: This is a normal EEG recording in the waking state. No evidence of ictal or interictal discharges are seen.    Medications Ordered in ED Medications  meclizine (ANTIVERT) tablet 25 mg (not administered)   On repeat exam the patient is awake and alert.  I have had a chance to review of chart, and discussed, as well as today's evaluation with him. Specifically we discussed prior evaluation with neurology, and  today's MRI, reassuring findings, no evidence for stroke, no evidence for concurrent infection, some suspicion for degenerative changes, possibly related to the patient's long-term alcohol use. With generally reassuring findings today, no evidence for new stroke, ACS, infection, hemodynamically instability, the patient will be discharged to a new course medication to follow-up with neurology.   Initial Impression / Assessment and Plan / ED Course  I have reviewed the triage vital signs and the nursing notes.  Pertinent labs & imaging results that were available during my care of the patient were reviewed by me and considered in my medical decision making (see chart for details).    Final Clinical Impressions(s) / ED Diagnoses   Dizziness  I personally performed the services described in this documentation, which was scribed in my presence. The recorded information has been reviewed and is accurate.       [name], MD 01/07/17 [phone]

## 2017-01-07 NOTE — Discharge Instructions (Signed)
As discussed, today's evaluation has been generally reassuring.  With your ongoing dizziness it is important that you take all medication as directed, and follow up with your neurologist.  Return here for concerning changes in your condition.

## 2017-01-14 ENCOUNTER — Encounter: Payer: Self-pay | Admitting: Cardiology

## 2017-01-14 ENCOUNTER — Ambulatory Visit (INDEPENDENT_AMBULATORY_CARE_PROVIDER_SITE_OTHER): Payer: Medicare Other | Admitting: Cardiology

## 2017-01-14 VITALS — BP 152/93 | HR 59 | Ht 68.0 in | Wt 198.0 lb

## 2017-01-14 DIAGNOSIS — R0789 Other chest pain: Secondary | ICD-10-CM

## 2017-01-14 DIAGNOSIS — I10 Essential (primary) hypertension: Secondary | ICD-10-CM | POA: Diagnosis not present

## 2017-01-14 DIAGNOSIS — R42 Dizziness and giddiness: Secondary | ICD-10-CM | POA: Diagnosis not present

## 2017-01-14 MED ORDER — MECLIZINE HCL 25 MG PO TABS: 25.0000 mg | ORAL_TABLET | Freq: Three times a day (TID) | ORAL | 0 refills | Status: DC | PRN

## 2017-01-14 NOTE — Progress Notes (Signed)
Clinical Summary Thomas [name] is a 66 y.o.male seen today for follow up of the following medical problems.   1. Chest pain - previous ER visit with chest pain - symptoms thought to be atypical, Enzymes .03 x 2and EKG. CXR no acute process. Discharged from ER - started 3 weeks ago. Sharp pain midchest, 4/10 in severity. Can occur at rest or with exertion. No other associated symptoms. Not positional. Lasts 4-5 seconds, occurring daily.  - no significant SOB or DOE. Highest level of activity is housework which he tolerates without troubles - stable in severity and frequency. No relation to food.  CAD risk factors: HTN, father had "enlarged heart", older brother CHF.   - no recent chest pain since last visit. Symptoms have resolved with bp control   2. Dementia - followed by neurology  3. HTN  - home bp's are at goal around 120/70s    4. Headache/dizziness - 12/16/16 ER visit, CT head without acute changes - symptoms occur mainly with standing. Fairy limited oral hydration.   - he was to stop his HCTZ last visit mistakingly continued. Repeat ER visit 12/29/16 with dizziness.   Past Medical History:  Diagnosis Date  . Altered mental status   . Confusion   . Hypertension   . Memory loss   . Seizures (HCC)    years ago     No Known Allergies   Current Outpatient Prescriptions  Medication Sig Dispense Refill  . acetaminophen (TYLENOL) 500 MG tablet Take 500 mg by mouth every 6 (six) hours as needed for mild pain.    Marland Kitchen amLODipine (NORVASC) 10 MG tablet Take 10 mg by mouth daily.    Marland Kitchen aspirin 81 MG tablet Take 81 mg by mouth daily.    . bisoprolol (ZEBETA) 10 MG tablet Take 1 tablet (10 mg total) by mouth daily. 90 tablet 1  . LORazepam (ATIVAN) 0.5 MG tablet Take 1 tablet (0.5 mg total) by mouth every 6 (six) hours as needed (dizziness). 21 tablet 0   No current facility-administered medications for this visit.      Past Surgical History:  Procedure  Laterality Date  . ABDOMINAL SURGERY     abdominal aneurysm      No Known Allergies    Family History  Problem Relation Age of Onset  . Stroke Mother   . Hypertension Sister   . Diabetes Brother     x 2     Social History Mr. Douthit reports that he has never smoked. He has never used smokeless tobacco. Mr. Chiou reports that he does not drink alcohol.   Review of Systems CONSTITUTIONAL: No weight loss, fever, chills, weakness or fatigue.  HEENT: Eyes: No visual loss, blurred vision, double vision or yellow sclerae.No hearing loss, sneezing, congestion, runny nose or sore throat.  SKIN: No rash or itching.  CARDIOVASCULAR: per hpi RESPIRATORY: No shortness of breath, cough or sputum.  GASTROINTESTINAL: No anorexia, nausea, vomiting or diarrhea. No abdominal pain or blood.  GENITOURINARY: No burning on urination, no polyuria NEUROLOGICAL:per hpi MUSCULOSKELETAL: No muscle, back pain, joint pain or stiffness.  LYMPHATICS: No enlarged nodes. No history of splenectomy.  PSYCHIATRIC: No history of depression or anxiety.  ENDOCRINOLOGIC: No reports of sweating, cold or heat intolerance. No polyuria or polydipsia.  Marland Kitchen   Physical Examination Vitals:   01/14/17 1451 01/14/17 1454  BP: (!) 152/86 (!) 152/93  Pulse: 61 (!) 59   Vitals:   01/14/17 1445  Weight: 198 lb (  89.8 kg)  Height:  (1.727 m)     Gen: resting comfortably, no acute distress HEENT: no scleral icterus, pupils equal round and reactive, no palptable cervical adenopathy,  CV: RRR, no m/r/g, no jvd Resp: Clear to auscultation bilaterally GI: abdomen is soft, non-tender, non-distended, normal bowel sounds, no hepatosplenomegaly MSK: extremities are warm, no edema.  Skin: warm, no rash Neuro:  no focal deficits Psych: appropriate affect   Diagnostic Studies 12/2016 MRI IMPRESSION: 1. Stable and normal for age noncontrast MRI appearance of the brain. 2. Chronic degenerative cervical spinal  stenosis at C2-C3 with chronic spinal cord mass effect at that level.    Assessment and Plan   1. Chest pain - no recent symptoms, we will continue to follow clincally  2. HTN -bp elevated in clinic, home bp's at goal - continue to monitor.   3. Dizziness - symptoms suggestive of possible vertigo - will try prn meclizine.    F/u 6 months   Antoine Poche, M.D.

## 2017-01-14 NOTE — Patient Instructions (Signed)
Medication Instructions:  Your physician has recommended you make the following change in your medication: BEGIN TAKING MECLIZINE  EVERY 8 HOURS AS NEEDED FOR DIZZINESS CONTINUE TAKING ALL OTHER MEDICATIONS AS DIRECTED  Labwork: NONE  Testing/Procedures: NONE  Follow-Up: Your physician wants you to follow-up in: 6 MONTHS WITH DR. BRANCH. You will receive a reminder letter in the mail two months in advance. If you don't receive a letter, please call our office to schedule the follow-up appointment.  Any Other Special Instructions Will Be Listed Below (If Applicable).  If you need a refill on your cardiac medications before your next appointment, please call your pharmacy.

## 2017-01-22 ENCOUNTER — Encounter (HOSPITAL_COMMUNITY): Payer: Self-pay | Admitting: Emergency Medicine

## 2017-01-22 ENCOUNTER — Emergency Department (HOSPITAL_COMMUNITY): Payer: Medicare Other

## 2017-01-22 ENCOUNTER — Emergency Department (HOSPITAL_COMMUNITY)
Admission: EM | Admit: 2017-01-22 | Discharge: 2017-01-22 | Disposition: A | Discharge status: Home / Self Care | Payer: Medicare Other | Attending: Emergency Medicine | Admitting: Emergency Medicine

## 2017-01-22 DIAGNOSIS — Z7982 Long term (current) use of aspirin: Secondary | ICD-10-CM | POA: Insufficient documentation

## 2017-01-22 DIAGNOSIS — R51 Headache: Secondary | ICD-10-CM | POA: Diagnosis not present

## 2017-01-22 DIAGNOSIS — Z79899 Other long term (current) drug therapy: Secondary | ICD-10-CM | POA: Diagnosis not present

## 2017-01-22 DIAGNOSIS — R519 Headache, unspecified: Secondary | ICD-10-CM

## 2017-01-22 DIAGNOSIS — I10 Essential (primary) hypertension: Secondary | ICD-10-CM | POA: Insufficient documentation

## 2017-01-22 MED ORDER — KETOROLAC TROMETHAMINE 30 MG/ML IJ SOLN
30.0000 mg | Freq: Once | INTRAMUSCULAR | Status: AC
  Administered 2017-01-22: 30 mg via INTRAVENOUS
  Filled 2017-01-22: qty 1

## 2017-01-22 MED ORDER — DIPHENHYDRAMINE HCL 50 MG/ML IJ SOLN
25.0000 mg | Freq: Once | INTRAMUSCULAR | Status: AC
  Administered 2017-01-22: 25 mg via INTRAVENOUS
  Filled 2017-01-22: qty 1

## 2017-01-22 MED ORDER — ACETAMINOPHEN-CODEINE #3 300-30 MG PO TABS: 1.0000 | ORAL_TABLET | Freq: Four times a day (QID) | ORAL | 0 refills | Status: DC | PRN

## 2017-01-22 MED ORDER — METOCLOPRAMIDE HCL 5 MG/ML IJ SOLN
10.0000 mg | Freq: Once | INTRAMUSCULAR | Status: AC
  Administered 2017-01-22: 10 mg via INTRAVENOUS
  Filled 2017-01-22: qty 2

## 2017-01-22 MED ORDER — SODIUM CHLORIDE 0.9 % IV BOLUS (SEPSIS)
1000.0000 mL | Freq: Once | INTRAVENOUS | Status: AC
  Administered 2017-01-22: 1000 mL via INTRAVENOUS

## 2017-01-22 NOTE — Discharge Instructions (Signed)
Prescription for headache medicine. Follow-up your primary care doctor next week.

## 2017-01-22 NOTE — ED Triage Notes (Signed)
Pt reports feeling "swimmy headed" intermittently for 2 days.  States he was here a few weeks ago for same and the pills are not helping.  C/o headache on the top of head for 2 days as well.

## 2017-01-22 NOTE — ED Notes (Signed)
Pt. To CT

## 2017-01-23 NOTE — ED Provider Notes (Signed)
[facility]PT Provider Note   CSN: [id number] Arrival date & time: 01/22/17  1230     History   Chief Complaint Chief Complaint  Patient presents with  . Headache    HPI Thomas [name] is a 66 y.o. male.  Patient presents with headache on the top of his scalp for 2 days. No neurological deficits or neck pain. He also has a "swimming headed feeling" for the last several weeks. He is taken over-the-counter medications with minimal relief.      Past Medical History:  Diagnosis Date  . Altered mental status   . Confusion   . Hypertension   . Memory loss   . Seizures (HCC)    years ago    Patient Active Problem List   Diagnosis Date Noted  . Dementia 11/04/2015  . Altered mental status     Past Surgical History:  Procedure Laterality Date  . ABDOMINAL SURGERY     abdominal aneurysm        Home Medications    Prior to Admission medications   Medication Sig Start Date End Date Taking? Authorizing Provider  acetaminophen (TYLENOL) 500 MG tablet Take 500 mg by mouth every 6 (six) hours as needed for mild pain.   Yes [provider]  amLODipine (NORVASC) 10 MG tablet Take 10 mg by mouth daily.   Yes [provider]  aspirin 81 MG tablet Take 81 mg by mouth daily.   Yes [provider]  bisoprolol (ZEBETA) 10 MG tablet Take 1 tablet (10 mg total) by mouth daily. 12/24/16  Yes BranchDorothe Pea, MD  meclizine (ANTIVERT) 25 MG tablet Take 1 tablet (25 mg total) by mouth 3 (three) times daily as needed for dizziness. 01/14/17  Yes Branch, Dorothe Pea, MD  acetaminophen-codeine (TYLENOL #3) 300-30 MG tablet Take 1-2 tablets by mouth every 6 (six) hours as needed for moderate pain. 01/22/17   Donnetta Hutching, MD  LORazepam (ATIVAN) 0.5 MG tablet Take 1 tablet (0.5 mg total) by mouth every 6 (six) hours as needed (dizziness). Patient not taking: Reported on 01/22/2017 01/07/17   [name], MD    Family History Family History  Problem Relation  Age of Onset  . Stroke Mother   . Hypertension Sister   . Diabetes Brother     x 2    Social History Social History  Substance Use Topics  . Smoking status: Never Smoker  . Smokeless tobacco: Never Used  . Alcohol use No     Comment: quit 6 months ago (12/29/2016)     Allergies   Patient has no known allergies.   Review of Systems Review of Systems  All other systems reviewed and are negative.    Physical Exam Updated Vital Signs BP (!) 137/91   Pulse 66   Temp 98.4 F (36.9 C) (Oral)   Resp (!) 22   Ht 5\' 8"  (1.727 m)   Wt 198 lb (89.8 kg)   SpO2 97%   BMI 30.11 kg/m   Physical Exam  Constitutional: He is oriented to person, place, and time. He appears well-developed and well-nourished.  HENT:  Head: Normocephalic and atraumatic.  Eyes: Conjunctivae are normal.  Neck: Neck supple.  Cardiovascular: Normal rate and regular rhythm.   Pulmonary/Chest: Effort normal and breath sounds normal.  Abdominal: Soft. Bowel sounds are normal.  Musculoskeletal: Normal range of motion.  Neurological: He is alert and oriented to person, place, and time.  Skin: Skin is warm and dry.  Psychiatric: He has a normal mood and affect. His behavior is normal.  Nursing note and vitals reviewed.    ED Treatments / Results  Labs (all labs ordered are listed, but only abnormal results are displayed) Labs Reviewed - No data to display  EKG  EKG Interpretation None       Radiology Ct Head Wo Contrast  Result Date: 01/22/2017 CLINICAL DATA:  Unexplained headache for 2 days EXAM: CT HEAD WITHOUT CONTRAST TECHNIQUE: Contiguous axial images were obtained from the base of the skull through the vertex without intravenous contrast. COMPARISON:  01/07/2017 FINDINGS: Brain: No evidence of acute infarction, hemorrhage, hydrocephalus, extra-axial collection or mass lesion/mass effect. Vascular: No hyperdense vessel or unexpected calcification. Skull: Normal. Negative for fracture or  focal lesion. Sinuses/Orbits: No acute finding. Other: None. IMPRESSION: 1. No acute intracranial abnormality. 2. Normal brain for age Electronically Signed   By: Signa Kellaylor  Stroud M.D.   On: 01/22/2017 14:14    Procedures Procedures (including critical care time)  Medications Ordered in ED Medications  sodium chloride 0.9 % bolus 1,000 mL (0 mLs Intravenous Stopped 01/22/17 1444)  metoCLOPramide (REGLAN) injection 10 mg (10 mg Intravenous Given 01/22/17 1317)  ketorolac (TORADOL) 30 MG/ML injection 30 mg (30 mg Intravenous Given 01/22/17 1318)  diphenhydrAMINE (BENADRYL) injection 25 mg (25 mg Intravenous Given 01/22/17 1319)     Initial Impression / Assessment and Plan / ED Course  I have reviewed the triage vital signs and the nursing notes.  Pertinent labs & imaging results that were available during my care of the patient were reviewed by me and considered in my medical decision making (see chart for details).     Patient has normal vital signs and normal physical exam. CT head negative. He feels better after IV fluids, IV Toradol, IV Reglan, IV Benadryl. Discharge medications Tylenol #3.  Final Clinical Impressions(s) / ED Diagnoses   Final diagnoses:  Intractable headache, unspecified chronicity pattern, unspecified headache type    New Prescriptions Discharge Medication List as of 01/22/2017  2:41 PM    START taking these medications   Details  acetaminophen-codeine (TYLENOL #3) 300-30 MG tablet Take 1-2 tablets by mouth every 6 (six) hours as needed for moderate pain., Starting Sat 01/22/2017, Print         Donnetta Hutchingook, Lyndal Alamillo, MD 01/23/17 [id number]

## 2017-01-24 ENCOUNTER — Encounter (INDEPENDENT_AMBULATORY_CARE_PROVIDER_SITE_OTHER): Payer: Self-pay

## 2017-01-24 ENCOUNTER — Encounter: Payer: Self-pay | Admitting: Neurology

## 2017-01-24 ENCOUNTER — Ambulatory Visit (INDEPENDENT_AMBULATORY_CARE_PROVIDER_SITE_OTHER): Payer: Medicare Other | Admitting: Neurology

## 2017-01-24 VITALS — BP 148/98 | HR 87 | Ht 68.0 in | Wt 194.2 lb

## 2017-01-24 DIAGNOSIS — R42 Dizziness and giddiness: Secondary | ICD-10-CM

## 2017-01-24 DIAGNOSIS — G44209 Tension-type headache, unspecified, not intractable: Secondary | ICD-10-CM

## 2017-01-24 MED ORDER — TOPIRAMATE 25 MG PO TABS: 25.0000 mg | ORAL_TABLET | Freq: Two times a day (BID) | ORAL | 3 refills | Status: DC

## 2017-01-24 NOTE — Progress Notes (Signed)
[facility]  PATIENT: Thomas [name] DOB: 04/26/1951   REASON FOR VISIT: Dementia without behavioral disturbance HISTORY FROM: Patient and nephew Thomas [name]    HISTORY OF PRESENT ILLNESS:Initial Consult 5/27/2016PS :[age] year African-American male who is accompanied by his nephew who provides most of the history. Patient has had some progressive mild memory difficulties for the last 10 months or so. He forgets recent activities that he was involved in and gets distracted. He has left the stove on a couple of times. He often forgets if door is locked or not. There are times when he can stop in mid sentence and search for words and even forget the names of family members. He lives with his nephew but is quite independent in activities of daily living except his not very well organized. 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 He is now retired. There is a history of stroke a few years ago but neither the patient or nephew are able to give me any details but he apparently had no residual deficits from it. He does have a history of heavy alcohol use but he has cut back in the last few years but still drinks 6 packs of beer per day. There is no definite history of Alzheimer's in the family with his mother who had stroke did have some memory difficulties. There is no history of seizures, falls, loss of consciousness or significant head injury. He has not had any evaluation for memory loss or recent brain imaging studies done. Update 8/16/2016PS : He returns for follow-up after last visit 2 and of months ago. He reports no change in his memory and cognitive difficulties with unchanged. He was unable to tolerate Aricept and stopped it within a few days due to upset stomach. He has however cut back alcohol intake now to 4-5 beers per week. Patient is  accompanied by son who provides most of the history. Patient did undergo MRI scan of the brain on 02/19/15 which had ordered with and personally reviewed shows mild generalized atrophy but no significant abnormalities. Lab work on 5/27 /16 for reversible causes of cognitive impairment is significant only for mildly elevated homocystine of 17.0.. EEG on 02/21/15 was normal. Update 2/14/2017PS : He returns for follow-up after last visit 6 months ago. He is a complaint by his nephew who provides most of the history. Patient took Exelon patch which I prescribed at last visit for [name] a week and discontinue it as it was irritating the skin. He remains cognitively stable. He still needs 24-hour supervision. He can take his own medications and ambulates without assistance. He however needs appointments with his financial matters as well as with keeping doctor's appointments. He had previously had trouble tolerating Aricept. He has had no new health problems since last visit. There have been no concerns about agitation, hallucinations, delusions or safety concerns UPDATE 11/08/2017CM Thomas [name], 66 year old male returns for follow-up with his nephew [name] BameRonnie. He has a history of dementia which is stable. He has failed Exelon patch due to skin irritation and Aricept in the past due to upset stomach. He has 24-hour supervision. There have been no safety issues identified. He continues to get out and walk everyday and visit friends in the neighborhood. He says he is not drinking alcohol now. There has been no hallucinations ,  agitation,  or wandering behavior. His blood pressure been elevated recently and he just had an increase in his medication by his primary care. Blood pressure in the office today 173/95. He was encouraged to be compliant with this medication. Nephew says he has a blood pressure cuff at home. He was encouraged to take it periodically and take the log to his primary care at next visit. Nephew does his  finances. MRI scan of the brain 02/19/2015 shows mild generalized atrophy and no significant abnormalities. He returns for reevaluation Update 01/24/2017 : He returns for follow-up after last visit 6 months ago. He states his memory difficulties about the same and they're not progressive. He has not been taking the Aricept. He has new complains of headache and dizziness. He describes the headache as intermittent transient lasting only a few minutes usually also vertex and pressure-like occasionally sharp but not disabling. There is an accompanying nausea but no light or sound sensitivity. There are no apparent triggers for the headaches. The headache is often followed by feeling of dizziness which he describes as from he headed feeling of being off balance. He denies true vertigo. He denies any ringing sound in his years, ear pain or discharge. He does have a remote history of cervical spine injury. He does complain of some muscle tightness and stiffness in the back of his head. He has quit drinking alcohol 8 months ago. He was seen in the emergency room at [facility] been hospitalized twice for his headache and dizziness. MRI scan of the brain as well as CT scan were both unremarkable. He was given prescription for Tylenol as well as meclizine he has tried both of which have not been helping him. REVIEW OF SYSTEMS: Full 14 system review of systems performed and notable only for those listed, all others are neg: Headache, dizziness, swimming headed, memory loss and all systems negative    ALLERGIES: No Known Allergies  HOME MEDICATIONS: Outpatient Medications Prior to Visit  Medication Sig Dispense Refill  . acetaminophen (TYLENOL) 500 MG tablet Take 500 mg by mouth every 6 (six) hours as needed for mild pain.    Marland Kitchen acetaminophen-codeine (TYLENOL #3) 300-30 MG tablet Take 1-2 tablets by mouth every 6 (six) hours as needed for moderate pain. 15 tablet 0  . amLODipine (NORVASC) 10 MG tablet Take 10 mg by  mouth daily.    Marland Kitchen aspirin 81 MG tablet Take 81 mg by mouth daily.    . bisoprolol (ZEBETA) 10 MG tablet Take 1 tablet (10 mg total) by mouth daily. 90 tablet 1  . meclizine (ANTIVERT) 25 MG tablet Take 1 tablet (25 mg total) by mouth 3 (three) times daily as needed for dizziness. 30 tablet 0  . LORazepam (ATIVAN) 0.5 MG tablet Take 1 tablet (0.5 mg total) by mouth every 6 (six) hours as needed (dizziness). (Patient not taking: Reported on 01/24/2017) 21 tablet 0   No facility-administered medications prior to visit.     PAST MEDICAL HISTORY: Past Medical History:  Diagnosis Date  . Altered mental status   . Confusion   . Hypertension   . Memory loss   . Seizures (HCC)    years ago    PAST SURGICAL HISTORY: Past Surgical History:  Procedure Laterality Date  . ABDOMINAL SURGERY     abdominal aneurysm     FAMILY HISTORY: Family History  Problem Relation Age of Onset  . Stroke Mother   . Hypertension Sister   . Diabetes Brother  x 2    SOCIAL HISTORY: Social History   Social History  . Marital status: Single    Spouse name: N/A  . Number of children: 0  . Years of education: 7   Occupational History  . 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 Smoking status: Never Smoker  . Smokeless tobacco: Never Used  . Alcohol use No     Comment: quit 8 months ago (12/29/2016)  . Drug use: No  . Sexual activity: Not on file   Other Topics Concern  . Not on file   Social History Narrative   Patient lives with nephew Cora Daniels(Thomas [name] Gann)   Right handed   5 brothers, 3 sisters   caffeine use - coffee 1 cup daily     PHYSICAL EXAM  Vitals:   01/24/17 1629  BP: (!) 148/98  Pulse: 87  Weight: 194 lb 3.2 oz (88.1 kg)  Height: 5\' 8"  (1.727 m)   Body mass index is 29.53 kg/m. General: well developed, well nourished middle-aged African-American male, seated, in no evident distress Head: head normocephalic and atraumatic.   Neck: supple with no carotid   bruits Cardiovascular: regular rate and rhythm, no murmurs Musculoskeletal: Old small left temporal bony deformity from old injury Skin:  no rash/petichiae Vascular:  Normal pulses all extremities   Neurological examination  Mental Status: Awake and fully alert. Oriented to place and time. Recent and remote memory poor. Attention span, concentration and fund of knowledge diminished. Mini-Mental status exam not done today.. Mood and affect appropriate. Mini-Mental status exam testing 21/30 with deficits in orientation, attention, calculation and recall and following three-step commands. Able to name 8 animals having 4 legs. Unable to copy intersecting pentagons. Clock drawing 2/4. Cranial Nerves:  Pupils equal, briskly reactive to light. Extraocular movements full without nystagmus but show exotropia   of the right eye.. Visual fields full to confrontation. Hearing intact. Facial sensation intact. Face, tongue, palate moves normally and symmetrically.  Motor: Normal bulk and tone. Normal strength in all tested extremity muscles. Sensory.: intact to touch , pinprick , position and vibratory sensation in the upper and lower extremities.  Coordination: Rapid alternating movements normal in all extremities. Finger-to-nose and heel-to-shin performed accurately bilaterally. Gait and Station: Arises from chair without difficulty. Stance is normal. Gait demonstrates normal stride length and balance . Able to heel, toe and tandem walk without difficulty.  Reflexes: 1+ and symmetric. Toes downgoing.  DIAGNOSTIC DATA (LABS, IMAGING, TESTING)   ASSESSMENT AND PLAN [age] year African-American male with a  2  year history of progressive memory loss likely from mild dementia versus   mild cognitive impairment in a  patient who at baseline has low cognition. Chronic alcohol abuse may also contribute to his cognitive impairment. He was unable to tolerate Aricept due to GI side effects and Exelon due to skin  irritation.new complaints of dizziness and headaches of unclear etiology likely tension headaches with remote history of cervical spine injury  PLAN: I had a long discussion the patient with his complaints of posterior neck pain and headache and dizziness which of unclear etiology. Recommend checking MRI scan cervical spine to rule out compressive etiology as well as a trial of Topamax 25 mg twice daily to help his headache. I also advised him to do regular neck stretching exercises. The patient states his memory and cognitive difficulties appeared to be stable even though he is not taking the Aricept. Greater than 50% time  during this 30 minute   visit was spent on counseling and coordination of care about his headache dizziness memory loss and answering questions He'll return for follow-up in 3 months or call earlier if necessary Delia Heady, MD [facility] [address] [place], [place] [address] [phone]

## 2017-01-24 NOTE — Patient Instructions (Addendum)
I had a long discussion the patient with his complaints of posterior neck pain and headache and dizziness which of unclear etiology. Recommend checking MRI scan cervical spine to rule out compressive etiology as well as a trial of Topamax 25 mg twice daily to help his headache. I also advised him to do regular neck stretching exercises. The patient states his memory and cognitive difficulties appeared to be stable even though he is not taking the Aricept. He'll return for follow-up in 3 months or call earlier if necessary  Neck Exercises Neck exercises can be important for many reasons:  They can help you to improve and maintain flexibility in your neck. This can be especially important as you age.  They can help to make your neck stronger. This can make movement easier.  They can reduce or prevent neck pain.  They may help your upper back. Ask your health care provider which neck exercises would be best for you. Exercises Neck Press  Repeat this exercise 10 times. Do it first thing in the morning and right before bed or as told by your health care provider. 1. Lie on your back on a firm bed or on the floor with a pillow under your head. 2. Use your neck muscles to push your head down on the pillow and straighten your spine. 3. Hold the position as well as you can. Keep your head facing up and your chin tucked. 4. Slowly count to 5 while holding this position. 5. Relax for a few seconds. Then repeat. Isometric Strengthening  Do a full set of these exercises 2 times a day or as told by your health care provider. 1. Sit in a supportive chair and place your hand on your forehead. 2. Push forward with your head and neck while pushing back with your hand. Hold for 10 seconds. 3. Relax. Then repeat the exercise 3 times. 4. Next, do thesequence again, this time putting your hand against the back of your head. Use your head and neck to push backward against the hand pressure. 5. Finally, do the  same exercise on either side of your head, pushing sideways against the pressure of your hand. Prone Head Lifts  Repeat this exercise 5 times. Do this 2 times a day or as told by your health care provider. 1. Lie face-down, resting on your elbows so that your chest and upper back are raised. 2. Start with your head facing downward, near your chest. Position your chin either on or near your chest. 3. Slowly lift your head upward. Lift until you are looking straight ahead. Then continue lifting your head as far back as you can stretch. 4. Hold your head up for 5 seconds. Then slowly lower it to your starting position. Supine Head Lifts  Repeat this exercise 8-10 times. Do this 2 times a day or as told by your health care provider. 1. Lie on your back, bending your knees to point to the ceiling and keeping your feet flat on the floor. 2. Lift your head slowly off the floor, raising your chin toward your chest. 3. Hold for 5 seconds. 4. Relax and repeat. Scapular Retraction  Repeat this exercise 5 times. Do this 2 times a day or as told by your health care provider. 1. Stand with your arms at your sides. Look straight ahead. 2. Slowly pull both shoulders backward and downward until you feel a stretch between your shoulder blades in your upper back. 3. Hold for 10-30 seconds. 4. Relax  and repeat. Contact a health care provider if:  Your neck pain or discomfort gets much worse when you do an exercise.  Your neck pain or discomfort does not improve within 2 hours after you exercise. If you have any of these problems, stop exercising right away. Do not do the exercises again unless your health care provider says that you can. Get help right away if:  You develop sudden, severe neck pain. If this happens, stop exercising right away. Do not do the exercises again unless your health care provider says that you can. Exercises Neck Stretch  Repeat this exercise 3-5 times. 1. Do this exercise while  standing or while sitting in a chair. 2. Place your feet flat on the floor, shoulder-width apart. 3. Slowly turn your head to the right. Turn it all the way to the right so you can look over your right shoulder. Do not tilt or tip your head. 4. Hold this position for 10-30 seconds. 5. Slowly turn your head to the left, to look over your left shoulder. 6. Hold this position for 10-30 seconds. Neck Retraction Repeat this exercise 8-10 times. Do this 3-4 times a day or as told by your health care provider. 1. Do this exercise while standing or while sitting in a sturdy chair. 2. Look straight ahead. Do not bend your neck. 3. Use your fingers to push your chin backward. Do not bend your neck for this movement. Continue to face straight ahead. If you are doing the exercise properly, you will feel a slight sensation in your throat and a stretch at the back of your neck. 4. Hold the stretch for 1-2 seconds. Relax and repeat. This information is not intended to replace advice given to you by your health care provider. Make sure you discuss any questions you have with your health care provider. Document Released: 08/18/2015 Document Revised: 02/12/2016 Document Reviewed: 03/17/2015 Elsevier Interactive Patient Education  2017 [employer].

## 2017-02-02 ENCOUNTER — Ambulatory Visit (HOSPITAL_COMMUNITY)
Admission: RE | Admit: 2017-02-02 | Discharge: 2017-02-02 | Disposition: A | Discharge status: Home / Self Care | Payer: Medicare Other | Source: Ambulatory Visit | Attending: Neurology | Admitting: Neurology

## 2017-02-02 DIAGNOSIS — M4802 Spinal stenosis, cervical region: Secondary | ICD-10-CM | POA: Insufficient documentation

## 2017-02-02 DIAGNOSIS — M4602 Spinal enthesopathy, cervical region: Secondary | ICD-10-CM | POA: Diagnosis not present

## 2017-02-02 DIAGNOSIS — G44209 Tension-type headache, unspecified, not intractable: Secondary | ICD-10-CM | POA: Diagnosis not present

## 2017-02-02 DIAGNOSIS — M5021 Other cervical disc displacement,  high cervical region: Secondary | ICD-10-CM | POA: Diagnosis not present

## 2017-02-02 DIAGNOSIS — R42 Dizziness and giddiness: Secondary | ICD-10-CM | POA: Diagnosis not present

## 2017-02-07 ENCOUNTER — Other Ambulatory Visit: Payer: Self-pay | Admitting: Cardiology

## 2017-02-11 DIAGNOSIS — Z6831 Body mass index (BMI) 31.0-31.9, adult: Secondary | ICD-10-CM | POA: Diagnosis not present

## 2017-02-11 DIAGNOSIS — I10 Essential (primary) hypertension: Secondary | ICD-10-CM | POA: Diagnosis not present

## 2017-02-23 ENCOUNTER — Encounter (HOSPITAL_COMMUNITY): Payer: Self-pay | Admitting: Emergency Medicine

## 2017-02-23 ENCOUNTER — Other Ambulatory Visit: Payer: Self-pay

## 2017-02-23 ENCOUNTER — Emergency Department (HOSPITAL_COMMUNITY): Payer: Medicare Other

## 2017-02-23 ENCOUNTER — Emergency Department (HOSPITAL_COMMUNITY)
Admission: EM | Admit: 2017-02-23 | Discharge: 2017-02-23 | Disposition: A | Discharge status: Home / Self Care | Payer: Medicare Other | Attending: Emergency Medicine | Admitting: Emergency Medicine

## 2017-02-23 ENCOUNTER — Telehealth: Payer: Self-pay | Admitting: Cardiology

## 2017-02-23 DIAGNOSIS — Z7982 Long term (current) use of aspirin: Secondary | ICD-10-CM | POA: Diagnosis not present

## 2017-02-23 DIAGNOSIS — R079 Chest pain, unspecified: Secondary | ICD-10-CM | POA: Diagnosis not present

## 2017-02-23 DIAGNOSIS — R0789 Other chest pain: Secondary | ICD-10-CM | POA: Diagnosis not present

## 2017-02-23 DIAGNOSIS — Z79899 Other long term (current) drug therapy: Secondary | ICD-10-CM | POA: Diagnosis not present

## 2017-02-23 DIAGNOSIS — R05 Cough: Secondary | ICD-10-CM | POA: Diagnosis not present

## 2017-02-23 LAB — BASIC METABOLIC PANEL
Anion gap: 7 (ref 5–15)
BUN: 10 mg/dL (ref 6–20)
CALCIUM: 9.3 mg/dL (ref 8.9–10.3)
CO2: 26 mmol/L (ref 22–32)
CREATININE: 1.13 mg/dL (ref 0.61–1.24)
Chloride: 107 mmol/L (ref 101–111)
GFR calc Af Amer: >60 mL/min (ref >60)
GLUCOSE: 105 mg/dL — AB (ref 65–99)
Potassium: 3.3 mmol/L — ABNORMAL LOW (ref 3.5–5.1)
Sodium: 140 mmol/L (ref 135–145)

## 2017-02-23 LAB — CBC WITH DIFFERENTIAL/PLATELET
Basophils Absolute: 0 10*3/uL (ref 0.0–0.1)
Basophils Relative: 0 %
EOS ABS: 0.2 10*3/uL (ref 0.0–0.7)
EOS PCT: 2 %
HCT: 40.8 % (ref 39.0–52.0)
Hemoglobin: 14.2 g/dL (ref 13.0–17.0)
LYMPHS ABS: 3.1 10*3/uL (ref 0.7–4.0)
Lymphocytes Relative: 37 %
MCH: 31.2 pg (ref 26.0–34.0)
MCHC: 34.8 g/dL (ref 30.0–36.0)
MCV: 89.7 fL (ref 78.0–100.0)
MONOS PCT: 6 %
Monocytes Absolute: 0.5 10*3/uL (ref 0.1–1.0)
Neutro Abs: 4.5 10*3/uL (ref 1.7–7.7)
Neutrophils Relative %: 55 %
PLATELETS: 256 10*3/uL (ref 150–400)
RBC: 4.55 MIL/uL (ref 4.22–5.81)
RDW: 14.1 % (ref 11.5–15.5)
WBC: 8.2 10*3/uL (ref 4.0–10.5)

## 2017-02-23 LAB — TROPONIN I: Troponin I: 0.03 ng/mL (ref <0.03)

## 2017-02-23 MED ORDER — ACETAMINOPHEN 325 MG PO TABS
650.0000 mg | ORAL_TABLET | Freq: Once | ORAL | Status: AC
  Administered 2017-02-23: 650 mg via ORAL
  Filled 2017-02-23: qty 2

## 2017-02-23 NOTE — Discharge Instructions (Signed)
Use heat on the sore area, 3 or 4 times a day for 30 minutes.  For pain, take Tylenol every 4 hours.  Follow-up with your doctor next week, for a checkup.  Also ask your doctor about your MRI test results of the cervical spine, and get recommendations for treatment.

## 2017-02-23 NOTE — Telephone Encounter (Signed)
Nephew [name] Sholtz called stating that his uncle has been having chest pains off and on since yesterday. States no shortness of breath.  Checked schedules to offer an appointment with nothing available. Will have Someone triage patient to see if it is ok to schedule out.

## 2017-02-23 NOTE — ED Provider Notes (Addendum)
[facility]-EMERGENCY DEPT Provider Note   CSN: [id number] Arrival date & time: 02/23/17  1915     History   Chief Complaint Chief Complaint  Patient presents with  . Chest Pain    HPI Thomas [name] is a 66 y.o. male.  He presents for evaluation of left chest pain, intermittent, lasting about a minute, which started yesterday, but did not bother him during the night, while he was sleeping.  He denies associated shortness of breath, diaphoresis, blurred vision, dizziness, or weakness.  He does have occasional nausea with it.  He has previously had a similar problem for which he was evaluated in the emergency department.  Also he saw his cardiologist, 2 weeks ago, and at that time his blood pressure medicine was decreased.  He was seen by neurology about 1 month ago, and was quickly evaluated, for neck pain with an MRI.  The MRI showed diffuse cervical spondylosis, and foraminal narrowing, and some spinal stenosis.  He is using his usual medications without relief.  There are no other known modifying factors.  HPI  Past Medical History:  Diagnosis Date  . Altered mental status   . Confusion   . Hypertension   . Memory loss   . Seizures (HCC)    years ago    Patient Active Problem List   Diagnosis Date Noted  . Dementia 11/04/2015  . Altered mental status     Past Surgical History:  Procedure Laterality Date  . ABDOMINAL SURGERY     abdominal aneurysm        Home Medications    Prior to Admission medications   Medication Sig Start Date End Date Taking? Authorizing Provider  acetaminophen-codeine (TYLENOL #3) 300-30 MG tablet Take 1-2 tablets by mouth every 6 (six) hours as needed for moderate pain. 01/22/17  Yes Donnetta Hutching, MD  amLODipine (NORVASC) 10 MG tablet Take 10 mg by mouth daily.   Yes [provider]  aspirin 81 MG tablet Take 81 mg by mouth daily.   Yes [provider]  bisoprolol (ZEBETA) 10 MG tablet Take 1 tablet (10 mg total) by mouth daily.  12/24/16  Yes Antoine Poche, MD  topiramate (TOPAMAX) 25 MG tablet Take 1 tablet (25 mg total) by mouth 2 (two) times daily. 01/24/17  Yes Micki Riley, MD  meclizine (ANTIVERT) 25 MG tablet Take 1 tablet (25 mg total) by mouth 3 (three) times daily as needed for dizziness. Patient not taking: Reported on 02/23/2017 01/14/17   Antoine Poche, MD    Family History Family History  Problem Relation Age of Onset  . Stroke Mother   . Hypertension Sister   . Diabetes Brother        x 2    Social History Social History  Substance Use Topics  . Smoking status: Never Smoker  . Smokeless tobacco: Never Used  . Alcohol use No     Comment: quit 8 months ago (12/29/2016)     Allergies   Patient has no known allergies.   Review of Systems Review of Systems  All other systems reviewed and are negative.    Physical Exam Updated Vital Signs BP (!) 155/93   Pulse (!) 43   Temp 98.1 F (36.7 C)   Resp (!) 22   Ht 5\' 8"  (1.727 m)   Wt 85.3 kg (188 lb)   SpO2 97%   BMI 28.59 kg/m   Physical Exam  Constitutional: He is oriented to person, place, and  time. He appears well-developed. No distress.  Elderly, frail  HENT:  Head: Normocephalic and atraumatic.  Right Ear: External ear normal.  Left Ear: External ear normal.  Eyes: Conjunctivae and EOM are normal. Pupils are equal, round, and reactive to light.  Neck: Normal range of motion and phonation normal. Neck supple.  Cardiovascular: Normal rate, regular rhythm and normal heart sounds.   Pulmonary/Chest: Effort normal and breath sounds normal. No respiratory distress. He has no wheezes. He exhibits tenderness (Left anterior chest wall, mild). He exhibits no bony tenderness.  Abdominal: Soft. There is no tenderness.  Musculoskeletal: Normal range of motion.  Neurological: He is alert and oriented to person, place, and time. No cranial nerve deficit or sensory deficit. He exhibits normal muscle tone. Coordination normal.   Skin: Skin is warm, dry and intact.  Psychiatric: He has a normal mood and affect. His behavior is normal. Judgment and thought content normal.  Nursing note and vitals reviewed.    ED Treatments / Results  Labs (all labs ordered are listed, but only abnormal results are displayed) Labs Reviewed  TROPONIN I - Abnormal; Notable for the following:       Result Value   Troponin I 0.03 (*)    All other components within normal limits  BASIC METABOLIC PANEL - Abnormal; Notable for the following:    Potassium 3.3 (*)    Glucose, Bld 105 (*)    All other components within normal limits  CBC WITH DIFFERENTIAL/PLATELET    EKG  EKG Interpretation  Date/Time:  Wednesday February 23 2017 19:22:17 EDT Ventricular Rate:  49 PR Interval:    QRS Duration: 93 QT Interval:  431 QTC Calculation: 389 R Axis:   -9 Text Interpretation:  Sinus bradycardia Left atrial enlargement Borderline T abnormalities, diffuse leads Since last tracing rate slower Confirmed by Mancel Bale [phone]) on 02/24/2017 9:53:37 PM           Radiology Dg Chest 2 View  Result Date: 02/23/2017 CLINICAL DATA:  left sided chest pain that radiates across the chest since yesterday. Denies sob or cough. History of HTN, Seizures. EXAM: CHEST - 2 VIEW COMPARISON:  01/07/2017 FINDINGS: Lungs are clear. Heart size and mediastinal contours are within normal limits. Tortuous thoracic aorta. No effusion.  No pneumothorax. Visualized bones unremarkable. IMPRESSION: No acute cardiopulmonary disease. Electronically Signed   By: Corlis Leak M.D.   On: 02/23/2017 20:18    Procedures Procedures (including critical care time)  Medications Ordered in ED Medications  acetaminophen (TYLENOL) tablet 650 mg (650 mg Oral Given 02/23/17 1952)     Initial Impression / Assessment and Plan / ED Course  I have reviewed the triage vital signs and the nursing notes.  Pertinent labs & imaging results that were available during my care of the  patient were reviewed by me and considered in my medical decision making (see chart for details).      Patient Vitals for the past 24 hrs:  BP Temp Pulse Resp SpO2 Height Weight  02/23/17 2130 (!) 155/93 - (!) 43 (!) 22 97 % - -  02/23/17 2115 - - (!) 42 (!) 22 96 % - -  02/23/17 2100 (!) 150/89 - (!) 45 (!) 21 97 % - -  02/23/17 2030 (!) 141/95 - (!) 46 18 97 % - -  02/23/17 1945 - - (!) 47 18 97 % - -  02/23/17 1930 (!) 154/98 - (!) 48 (!) 22 97 % - -  02/23/17 1928 - - Marland Kitchen)  49 (!) 21 97 % - -  02/23/17 1920 (!) 161/96 98.1 F (36.7 C) (!) 49 18 97 % - -  02/23/17 1918 - - - - - 5\' 8"  (1.727 m) 85.3 kg (188 lb)    9:39 PM Reevaluation with update and discussion. After initial assessment and treatment, an updated evaluation reveals no change in clinical status.  Findings discussed with the patient and his nephew, all questions were answered. Kagan Hietpas L    Final Clinical Impressions(s) / ED Diagnoses   Final diagnoses:  Nonspecific chest pain    Patient with noncardiac chest pain presents for evaluation of intermittent chest pain for 2 days, with reassuring evaluation.  He has mild chest wall tenderness.  Doubt ACS, PE or pneumonia.  Mild hypertension is present.  Patient was recently evaluated by neurology for neck pain and was found to have significant cervical spine disease.    Nursing Notes Reviewed/ Care Coordinated Applicable Imaging Reviewed Interpretation of Laboratory Data incorporated into ED treatment  The patient appears reasonably screened and/or stabilized for discharge and I doubt any other medical condition or other [facility] requiring further screening, evaluation, or treatment in the ED at this time prior to discharge.  Plan: Home Medications-APAP, as needed.  Continue usual medications; Home Treatments-heat to affected area; return here if the recommended treatment, does not improve the symptoms; Recommended follow up-PCP and neurology (for cervical  spondylosis/spinal stenosis), for follow-up care.    New Prescriptions New Prescriptions   No medications on file     Mancel BaleWentz, Arsenia Goracke, MD 02/23/17 2154    Mancel BaleWentz, Nashley Cordoba, MD 02/24/17 2154

## 2017-02-23 NOTE — Telephone Encounter (Signed)
Patient notified and will call with BP when he gets batteries in machine. Apt scheduled on 03/04/17 at 4:00 with Dr. Lyman BishopLawrence. All medications have been taken today

## 2017-02-23 NOTE — Telephone Encounter (Signed)
Patients nephew states patient is not having any active chest pain at this time. When he does have chest pain he would rate it as a 5/10. Patient has had chest pain off and on since yesterday. No chest pain within the last hour. No shortness of breath. No other symptoms.

## 2017-02-23 NOTE — ED Notes (Signed)
Date and time results received: 02/23/17 2036 (use smartphrase ".now" to insert current time)  Test: troponin Critical Value: 0.04  Name of Provider Notified: Dr. Effie ShyWentz  Orders Received? Or Actions Taken?: no orders at this time

## 2017-02-23 NOTE — ED Triage Notes (Signed)
Pt c/o left sided chest pain that radiates across the chest since yesterday.

## 2017-02-23 NOTE — Telephone Encounter (Signed)
Please have him check BP to make sure not elevated causing chest discomfort. Make sure he has taken his BP medications. If he continues to have chest pain with normal BP, or worsening symptoms, should be seen in ER. Will need to have appointment in the next week for further evaluation.

## 2017-02-24 NOTE — Telephone Encounter (Signed)
Thx for update, he has a PA appt who will f/u at that time   Dominga FerryJ Branch MD

## 2017-02-24 NOTE — Telephone Encounter (Signed)
Can we touch base with pt today and see how he is doing?  Dominga FerryJ Rinnah Peppel MD

## 2017-02-24 NOTE — Telephone Encounter (Signed)
Patient was seen in [facility] ER yesterday. Patient stated he was told he had a lot of arthritis and was told to take tylenol. Patient stated he is feeling much better today.

## 2017-03-04 ENCOUNTER — Encounter: Payer: Self-pay | Admitting: Adult Health

## 2017-03-04 ENCOUNTER — Ambulatory Visit (INDEPENDENT_AMBULATORY_CARE_PROVIDER_SITE_OTHER): Payer: Medicare Other | Admitting: Adult Health

## 2017-03-04 VITALS — BP 180/106 | HR 74 | Ht 68.0 in | Wt 194.0 lb

## 2017-03-04 DIAGNOSIS — I10 Essential (primary) hypertension: Secondary | ICD-10-CM | POA: Diagnosis not present

## 2017-03-04 DIAGNOSIS — M4712 Other spondylosis with myelopathy, cervical region: Secondary | ICD-10-CM | POA: Diagnosis not present

## 2017-03-04 DIAGNOSIS — R0602 Shortness of breath: Secondary | ICD-10-CM | POA: Diagnosis not present

## 2017-03-04 DIAGNOSIS — R0789 Other chest pain: Secondary | ICD-10-CM | POA: Diagnosis not present

## 2017-03-04 DIAGNOSIS — M47812 Spondylosis without myelopathy or radiculopathy, cervical region: Secondary | ICD-10-CM | POA: Diagnosis not present

## 2017-03-04 MED ORDER — AMLODIPINE BESYLATE 10 MG PO TABS: 10.0000 mg | ORAL_TABLET | Freq: Every day | ORAL | 3 refills | Status: DC

## 2017-03-04 NOTE — Progress Notes (Signed)
Cardiology Office Note   Date:  03/04/2017   ID:  Thomas [name], Thomas [name] October 15, 1950, MRN [id number]  PCP:  Elfredia Nevins, MD  Cardiologist: Asher Muir chief complaint on file.     History of Present Illness: Thomas [name] is a 66 y.o. male who presents for ongoing assessment and management of atypical chest pain, hypertension, with history of dementia and chronic headaches and dizziness. Was last seen by Dr. Wyline Mood in April 2018 no further testing was planned. Was seen in the emergency room on 02/23/2017 with nonspecific chest pain.  He comes today post ER visit. In ER his cervical spine was filmed, and he was found have spondylosis, moderately large central disc protrusion at C2-3 with posterior ligament hypertrophy and moderate severe spinal stenosis. He has spinal stenosis of C3-C4 due to spurring, spinal stenosis and moderate foraminal encroachment bilaterally at C4-C5 due to spurring, mild spell stenosis C5-C6 with moderate foraminal encroachment bilaterally due to spurring. Foraminal encroachment bilaterally C7-T1 and T1-T2, due to spurring.  He was advised to follow-up with his primary care physician with possible referral to orthopedic surgeon. He has not done so yet. He comes today with elevated blood pressure and continued pressure in his chest. He requests pain medication.  Past Medical History:  Diagnosis Date  . Altered mental status   . Confusion   . Hypertension   . Memory loss   . Seizures (HCC)    years ago    Past Surgical History:  Procedure Laterality Date  . ABDOMINAL SURGERY     abdominal aneurysm      Current Outpatient Prescriptions  Medication Sig Dispense Refill  . amLODipine (NORVASC) 10 MG tablet Take 1 tablet (10 mg total) by mouth daily. 90 tablet 3  . aspirin 81 MG tablet Take 81 mg by mouth daily.    . bisoprolol (ZEBETA) 10 MG tablet Take 1 tablet (10 mg total) by mouth daily. 90 tablet 1  . topiramate (TOPAMAX) 25 MG tablet Take 1 tablet (25 mg  total) by mouth 2 (two) times daily. 120 tablet 3   No current facility-administered medications for this visit.     Allergies:   Patient has no known allergies.    Social History:  The patient  reports that he has never smoked. He has never used smokeless tobacco. He reports that he does not drink alcohol or use drugs.   Family History:  The patient's family history includes Diabetes in his brother; Hypertension in his sister; Stroke in his mother.    ROS: All other systems are reviewed and negative. Unless otherwise mentioned in H&P    PHYSICAL EXAM: VS:  BP (!) 180/106   Pulse 74   Ht 5\' 8"  (1.727 m)   Wt 194 lb (88 kg)   SpO2 97%   BMI 29.50 kg/m  , BMI Body mass index is 29.5 kg/m. GEN: Well nourished, well developed, in no acute distress  HEENT: normal  Neck: no JVD, carotid bruits, or masses Cardiac: RRR; no murmurs, rubs, or gallops,no edema  Respiratory:  clear to auscultation bilaterally, normal work of breathing GI: soft, nontender, nondistended, + BS MS: no deformity or atrophy  Skin: warm and dry, no rash Neuro:  Strength and sensation are intact Psych: euthymic mood, full affect  Recent Labs: 01/07/2017: ALT 14 02/23/2017: BUN 10; Creatinine, Ser 1.13; Hemoglobin 14.2; Platelets 256; Potassium 3.3; Sodium 140    Lipid Panel No results found for: CHOL, TRIG, HDL, CHOLHDL, VLDL, LDLCALC, LDLDIRECT  Wt Readings from Last 3 Encounters:  03/04/17 194 lb (88 kg)  02/23/17 188 lb (85.3 kg)  01/24/17 194 lb 3.2 oz (88.1 kg)      ASSESSMENT AND PLAN:  1. Chronic chest pain: Noncardiac in etiology, likely musculoskeletal cervical and thoracic spine spurring and foraminal encroachment. The patient will follow up with primary care for pain management and possible referral for orthopedic surgery. I have advised in the cardiology will not provide pain medication. He is to avoid NSAIDs in the setting of hypertension, but Tylenol can be used over-the-counter until  being seen by primary.  2. Hypertension: The patient is not taking amlodipine as directed. He had been prescribed 10 mg daily, but is only on 5 mg daily, with elevated blood pressure on this office visit. I have given him some water and asking to take his 5 mg is afternoon for a total 10 for today. I have then marked the the 5 mg amlodipine bottle with the word "stop", asked him to put away the spinal and fill a new prescription for amlodipine 10 mg daily. He is to pick that up on the way home and begin this in the morning. He will continue his other medications as directed. This may help with some chest pressure. He will continue bisoprolol 10 mg daily.  I will have echocardiogram completed to evaluate for LV systolic function with long-standing hypertension. This will help us to adjust medications for blood pressure control should this be necessary.  3. Abnormal MRI of the cervical spine: To be seen by primary care for ongoing management and referrals at their discretion.   Current medicines are reviewed at length with the patient today.    Labs/ tests ordered today include: Echocardiogram  Bettey MareKathryn M. Liborio NixonLawrence DNP, ANP, AACC   03/04/2017 4:43 PM    [facility] [address], [place], [place] [address] Phone: [phone]; Fax: [phone]

## 2017-03-04 NOTE — Patient Instructions (Signed)
Medication Instructions:   Your physician recommends that you continue on your current medications as directed. Please refer to the Current Medication list given to you today.  Please pick up your new prescription for amlodipine 10 mg daily. Get rid of your 5 mg prescription.  Labwork:  NONE  Testing/Procedures: Your physician has requested that you have an echocardiogram. Echocardiography is a painless test that uses sound waves to create images of your heart. It provides your doctor with information about the size and shape of your heart and how well your heart's chambers and valves are working. This procedure takes approximately one hour. There are no restrictions for this procedure.  Follow-Up:  Your physician recommends that you schedule a follow-up appointment in: 1 month.  Any Other Special Instructions Will Be Listed Below (If Applicable).  If you need a refill on your cardiac medications before your next appointment, please call your pharmacy.

## 2017-03-09 DIAGNOSIS — H40013 Open angle with borderline findings, low risk, bilateral: Secondary | ICD-10-CM | POA: Diagnosis not present

## 2017-03-09 DIAGNOSIS — H5211 Myopia, right eye: Secondary | ICD-10-CM | POA: Diagnosis not present

## 2017-03-09 DIAGNOSIS — H2513 Age-related nuclear cataract, bilateral: Secondary | ICD-10-CM | POA: Diagnosis not present

## 2017-03-09 DIAGNOSIS — H50111 Monocular exotropia, right eye: Secondary | ICD-10-CM | POA: Diagnosis not present

## 2017-03-11 ENCOUNTER — Other Ambulatory Visit (HOSPITAL_COMMUNITY): Payer: Medicare Other

## 2017-03-11 DIAGNOSIS — M47812 Spondylosis without myelopathy or radiculopathy, cervical region: Secondary | ICD-10-CM | POA: Diagnosis not present

## 2017-03-11 DIAGNOSIS — Z6831 Body mass index (BMI) 31.0-31.9, adult: Secondary | ICD-10-CM | POA: Diagnosis not present

## 2017-03-11 DIAGNOSIS — E6609 Other obesity due to excess calories: Secondary | ICD-10-CM | POA: Diagnosis not present

## 2017-03-14 ENCOUNTER — Ambulatory Visit (HOSPITAL_COMMUNITY)
Admission: RE | Admit: 2017-03-14 | Discharge: 2017-03-14 | Disposition: A | Discharge status: Home / Self Care | Payer: Medicare Other | Source: Ambulatory Visit | Attending: Adult Health | Admitting: Adult Health

## 2017-03-14 DIAGNOSIS — I5030 Unspecified diastolic (congestive) heart failure: Secondary | ICD-10-CM | POA: Diagnosis not present

## 2017-03-14 DIAGNOSIS — I10 Essential (primary) hypertension: Secondary | ICD-10-CM

## 2017-03-14 DIAGNOSIS — R0602 Shortness of breath: Secondary | ICD-10-CM

## 2017-03-14 LAB — ECHOCARDIOGRAM COMPLETE
CHL CUP MV DEC (S): 264
CHL CUP STROKE VOLUME: 31 mL
E decel time: 264 msec
EERAT: 10.07
FS: 40 % (ref 28–44)
IVS/LV PW RATIO, ED: 1.02
LA ID, A-P, ES: 37 mm
LA diam end sys: 37 mm
LA diam index: 1.78 cm/m2
LA vol index: 26.3 mL/m2
LA vol: 54.7 mL
LAVOLA4C: 44.5 mL
LDCA: 4.91 cm2
LV E/e'average: 10.07
LV PW d: 10.8 mm — AB (ref 0.6–1.1)
LV TDI E'LATERAL: 7.62
LV dias vol: 55 mL — AB (ref 62–150)
LV sys vol index: 12 mL/m2
LV sys vol: 24 mL
LVDIAVOLIN: 27 mL/m2
LVEEMED: 10.07
LVELAT: 7.62 cm/s
LVOT SV: 118 mL
LVOT VTI: 24.1 cm
LVOT peak grad rest: 4 mmHg
LVOTD: 25 mm
LVOTPV: 98.2 cm/s
MV Peak grad: 2 mmHg
MV pk A vel: 97.1 m/s
MV pk E vel: 76.7 m/s
RV LATERAL S' VELOCITY: 14.8 cm/s
RV TAPSE: 26.1 mm
Simpson's disk: 56
TDI e' medial: 5.87

## 2017-03-14 NOTE — Progress Notes (Signed)
*  PRELIMINARY RESULTS* Echocardiogram 2D Echocardiogram has been performed.  Stacey DrainWhite, Melvern Ramone J 03/14/2017, 12:02 PM

## 2017-03-16 DIAGNOSIS — Z1389 Encounter for screening for other disorder: Secondary | ICD-10-CM | POA: Diagnosis not present

## 2017-03-16 DIAGNOSIS — Z6828 Body mass index (BMI) 28.0-28.9, adult: Secondary | ICD-10-CM | POA: Diagnosis not present

## 2017-03-16 DIAGNOSIS — Z0000 Encounter for general adult medical examination without abnormal findings: Secondary | ICD-10-CM | POA: Diagnosis not present

## 2017-03-22 ENCOUNTER — Ambulatory Visit: Payer: Medicare Other | Admitting: Neurology

## 2017-03-29 ENCOUNTER — Other Ambulatory Visit: Payer: Self-pay | Admitting: Neurology

## 2017-03-29 DIAGNOSIS — M4802 Spinal stenosis, cervical region: Secondary | ICD-10-CM

## 2017-03-30 ENCOUNTER — Telehealth: Payer: Self-pay | Admitting: Neurology

## 2017-03-30 NOTE — Telephone Encounter (Signed)
Message sent to Dr. Sethi. 

## 2017-03-30 NOTE — Telephone Encounter (Signed)
Please Advise If Newell Coraludelman is ok to see Patient ?

## 2017-03-30 NOTE — Telephone Encounter (Signed)
Dr. Newell CoralNudelman has reviewed chart and Relayed Patient will be scheduled with him 04/12/2017 at 1:00 pm. Per Dr. Newell CoralNudelman .  Patient is aware I have talked to him.

## 2017-03-30 NOTE — Telephone Encounter (Signed)
Due to urgent referral if Dr. Venetia MaxonStern is out of the office. Pt can see the next MD who is available.

## 2017-03-30 NOTE — Telephone Encounter (Signed)
Thanks that is fine 

## 2017-03-30 NOTE — Telephone Encounter (Signed)
Referral was sent as Urgent. Dr. Fredrich BirksStern's office called and Relayed that Dr. Venetia MaxonStern is out all week and Dr. Newell CoralNudelman is on call this week week for Urgent referral's . Please advise if Dr. Newell CoralNudelman can take care of the patient or wait for Dr. Venetia MaxonStern to return ?

## 2017-03-30 NOTE — Telephone Encounter (Signed)
Pt nephew calling stating that Dr Pearlean BrownieSethi called him on yesterday to inform that pt(his uncle) has spinal stenosis, pt nephew wanted this explained to his uncle and is asking for a call back, he will be with his uncle today anytime after 3pm, please call

## 2017-03-31 NOTE — Telephone Encounter (Signed)
I spoke to the patient and his nephew and explained the MRI scan of the cervicall spine showing significant spinal stenosis and need to see neurosurgery   to discuss surgical treatment options. They have made a appointment with [facility]  He was advised to go to the hospital some weakness in his arms, legs or gait difficulties. They voiced understanding.

## 2017-04-11 ENCOUNTER — Ambulatory Visit (INDEPENDENT_AMBULATORY_CARE_PROVIDER_SITE_OTHER): Payer: Medicare Other | Admitting: Adult Health

## 2017-04-11 ENCOUNTER — Encounter: Payer: Self-pay | Admitting: Adult Health

## 2017-04-11 VITALS — BP 170/100 | HR 65 | Ht 68.0 in | Wt 194.0 lb

## 2017-04-11 DIAGNOSIS — I10 Essential (primary) hypertension: Secondary | ICD-10-CM | POA: Diagnosis not present

## 2017-04-11 DIAGNOSIS — Z79899 Other long term (current) drug therapy: Secondary | ICD-10-CM

## 2017-04-11 MED ORDER — POTASSIUM CHLORIDE CRYS ER 20 MEQ PO TBCR: 20.0000 meq | EXTENDED_RELEASE_TABLET | Freq: Every day | ORAL | 3 refills | Status: DC

## 2017-04-11 MED ORDER — CHLORTHALIDONE 25 MG PO TABS: 25.0000 mg | ORAL_TABLET | Freq: Every day | ORAL | 3 refills | Status: DC

## 2017-04-11 NOTE — Progress Notes (Signed)
Cardiology Office Note   Date:  04/11/2017   ID:  Thomas [name], Thomas [name] Thomas [name], Thomas [name], MRN [id number]  PCP:  Elfredia Nevins, MD  Cardiologist:  [facility] Chief Complaint  Patient presents with  . Hypertension      History of Present Illness: Thomas [name] is a 66 y.o. male who presents for ongoing assessment and management of hypertension, history of atypical chest pain, dementia and chronic headaches. The patient was last in the office on 03/04/2017 post ER visit, where he had a cervical spine series revealing a large central disc protrusion at C2-C3, with hypertrophy and moderate severe spinal stenosis. He was planned for orthopedic surgery evaluation. The patient was not provided any pain medication.  He was found to be hypertensive and had not been taking amlodipine as directed, he was only on 5 mg daily and was advised to be taking 10 mg daily as prescribed. Echocardiogram was ordered to evaluate for LV systolic function.  Echocardiogram 03/14/2017 Left ventricle: The cavity size was normal. Wall thickness was   increased in a pattern of mild LVH. Systolic function was normal.   The estimated ejection fraction was in the range of 60% to 65%.   Wall motion was normal; there were no regional wall motion   abnormalities. Doppler parameters are consistent with abnormal   left ventricular relaxation (grade 1 diastolic dysfunction). - Aortic valve: Valve area (VTI): 5.11 cm^2. Valve area (Vmax):   4.27 cm^2. Valve area (Vmean): 4.22 cm^2. - Technically adequate study.  He comes today hypertensive. He states that he is taking his medications as directed. He has recently been placed on meloxicam by PCP, 25 mg BID.  He is also still eating a lot of salted foods.    Past Medical History:  Diagnosis Date  . Altered mental status   . Confusion   . Hypertension   . Memory loss   . Seizures (HCC)    years ago    Past Surgical History:  Procedure Laterality Date  . ABDOMINAL SURGERY     abdominal aneurysm      Current Outpatient Prescriptions  Medication Sig Dispense Refill  . amLODipine (NORVASC) 10 MG tablet Take 1 tablet (10 mg total) by mouth daily. 90 tablet 3  . aspirin 81 MG tablet Take 81 mg by mouth daily.    . bisoprolol (ZEBETA) 10 MG tablet Take 1 tablet (10 mg total) by mouth daily. 90 tablet 1  . topiramate (TOPAMAX) 25 MG tablet Take 1 tablet (25 mg total) by mouth 2 (two) times daily. 120 tablet 3  . chlorthalidone (HYGROTON) 25 MG tablet Take 1 tablet (25 mg total) by mouth daily. 90 tablet 3  . potassium chloride SA (K-DUR,KLOR-CON) 20 MEQ tablet Take 1 tablet (20 mEq total) by mouth daily. 90 tablet 3   No current facility-administered medications for this visit.     Allergies:   Patient has no known allergies.    Social History:  The patient  reports that he has never smoked. He has never used smokeless tobacco. He reports that he does not drink alcohol or use drugs.   Family History:  The patient's family history includes Diabetes in his brother; Hypertension in his sister; Stroke in his mother.    ROS: All other systems are reviewed and negative. Unless otherwise mentioned in H&P    PHYSICAL EXAM: VS:  BP (!) 170/100   Pulse 65   Ht 5\' 8"  (1.727 m)   Wt 194 lb (88 kg)  SpO2 98%   BMI 29.50 kg/m  , BMI Body mass index is 29.5 kg/m. GEN: Well nourished, well developed, in no acute distress  HEENT: normal  Neck: no JVD, carotid bruits, or masses Cardiac: RRR; no murmurs, rubs, or gallops,no edema  Respiratory:  clear to auscultation bilaterally, normal work of breathing GI: soft, nontender, nondistended, + BS MS: no deformity or atrophy  Skin: warm and dry, no rash Neuro:  Strength and sensation are intact Psych: euthymic mood, full affect   Recent Labs: 01/07/2017: ALT 14 02/23/2017: BUN 10; Creatinine, Ser 1.[name]; Hemoglobin 14.2; Platelets 256; Potassium 3.3; Sodium 140    Lipid Panel No results found for: CHOL, TRIG, HDL,  CHOLHDL, VLDL, LDLCALC, LDLDIRECT    Wt Readings from Last 3 Encounters:  04/11/17 194 lb (88 kg)  03/04/17 194 lb (88 kg)  02/23/17 188 lb (85.3 kg)    ASSESSMENT AND PLAN:  1. Uncontrolled hypertension: Even with increased dose of amlodipine, BP remains elevated. He admits to dietary non-compliance. I will add chlorthidone 25 mg daily with potassium 20 mEq daily. Check BMET in one week and see him back for a BP check in one week. If he has renal insufficiency on diuretic, will consider adding hydralazine instead.   2. Chest Pain: He has chronic arthritic pain. I have reinforced that cardiology will not be managing pain control. He will need to see PCP for ongoing management and possible referral to pain clinic if clinically indicated.    Current medicines are reviewed at length with the patient today.    Labs/ tests ordered today include: BMET Bettey MareKathryn M. Liborio NixonLawrence DNP, ANP, AACC   04/11/2017 2:46 PM    [facility] [address], [place], [place] [address] Phone: [phone]; Fax: [phone]

## 2017-04-11 NOTE — Patient Instructions (Signed)
Medication Instructions:  START CHLORTHALIDONE 25 MG DAILY IN THE MORNING  START POTASSIUM 20 MEQ DAILY   Labwork: 1 WEEK  BMET   Testing/Procedures: NONE  Follow-Up: Your physician recommends that you schedule a follow-up appointment in: 1 WEEK BLOOD PRESSURE CHECK  Your physician recommends that you schedule a follow-up appointment in: 1 MONTH     Any Other Special Instructions Will Be Listed Below (If Applicable).     If you need a refill on your cardiac medications before your next appointment, please call your pharmacy.

## 2017-04-15 ENCOUNTER — Other Ambulatory Visit: Payer: Self-pay | Admitting: Neurosurgery

## 2017-04-15 DIAGNOSIS — M4802 Spinal stenosis, cervical region: Secondary | ICD-10-CM | POA: Diagnosis not present

## 2017-04-15 DIAGNOSIS — M5030 Other cervical disc degeneration, unspecified cervical region: Secondary | ICD-10-CM | POA: Diagnosis not present

## 2017-04-15 DIAGNOSIS — Z6828 Body mass index (BMI) 28.0-28.9, adult: Secondary | ICD-10-CM | POA: Diagnosis not present

## 2017-04-15 DIAGNOSIS — M5020 Other cervical disc displacement, unspecified cervical region: Secondary | ICD-10-CM | POA: Diagnosis not present

## 2017-04-15 DIAGNOSIS — I10 Essential (primary) hypertension: Secondary | ICD-10-CM | POA: Diagnosis not present

## 2017-04-15 DIAGNOSIS — M542 Cervicalgia: Secondary | ICD-10-CM | POA: Diagnosis not present

## 2017-04-15 DIAGNOSIS — M47812 Spondylosis without myelopathy or radiculopathy, cervical region: Secondary | ICD-10-CM | POA: Diagnosis not present

## 2017-04-18 ENCOUNTER — Ambulatory Visit (INDEPENDENT_AMBULATORY_CARE_PROVIDER_SITE_OTHER): Payer: Medicare Other

## 2017-04-18 VITALS — BP 118/84 | HR 59 | Wt 189.0 lb

## 2017-04-18 DIAGNOSIS — Z0130 Encounter for examination of blood pressure without abnormal findings: Secondary | ICD-10-CM

## 2017-04-18 NOTE — Patient Instructions (Signed)
Continue your medications as directed.We will call you if there are any changes       Thank you for choosing [facility] !

## 2017-04-18 NOTE — Progress Notes (Signed)
BP check today, started on chlorthalidone 25 mg daily and potassium 20 meq on 04/11/17    Wt loss of 5 lbs, pt states he feels good     Will forward to Dr Lyman BishopLawrence

## 2017-04-19 NOTE — Progress Notes (Signed)
Thank you :)

## 2017-04-29 ENCOUNTER — Emergency Department (HOSPITAL_COMMUNITY): Payer: Medicare Other

## 2017-04-29 ENCOUNTER — Other Ambulatory Visit: Payer: Self-pay

## 2017-04-29 ENCOUNTER — Observation Stay (HOSPITAL_COMMUNITY)
Admission: EM | Admit: 2017-04-29 | Discharge: 2017-04-30 | Disposition: A | Discharge status: Home / Self Care | Payer: Medicare Other | Attending: Internal Medicine | Admitting: Internal Medicine

## 2017-04-29 ENCOUNTER — Encounter (HOSPITAL_COMMUNITY): Payer: Self-pay | Admitting: Emergency Medicine

## 2017-04-29 DIAGNOSIS — R079 Chest pain, unspecified: Secondary | ICD-10-CM | POA: Diagnosis present

## 2017-04-29 DIAGNOSIS — Z7982 Long term (current) use of aspirin: Secondary | ICD-10-CM | POA: Diagnosis not present

## 2017-04-29 DIAGNOSIS — R0789 Other chest pain: Secondary | ICD-10-CM | POA: Diagnosis not present

## 2017-04-29 DIAGNOSIS — E876 Hypokalemia: Secondary | ICD-10-CM | POA: Diagnosis present

## 2017-04-29 DIAGNOSIS — I10 Essential (primary) hypertension: Secondary | ICD-10-CM | POA: Insufficient documentation

## 2017-04-29 DIAGNOSIS — Z79899 Other long term (current) drug therapy: Secondary | ICD-10-CM | POA: Diagnosis not present

## 2017-04-29 DIAGNOSIS — M4802 Spinal stenosis, cervical region: Secondary | ICD-10-CM | POA: Diagnosis present

## 2017-04-29 DIAGNOSIS — N179 Acute kidney failure, unspecified: Secondary | ICD-10-CM | POA: Diagnosis present

## 2017-04-29 HISTORY — DX: Spinal stenosis, site unspecified: M48.00

## 2017-04-29 LAB — BASIC METABOLIC PANEL
ANION GAP: 12 (ref 5–15)
BUN: 30 mg/dL — ABNORMAL HIGH (ref 6–20)
CALCIUM: 9.8 mg/dL (ref 8.9–10.3)
CO2: 28 mmol/L (ref 22–32)
CREATININE: 2.06 mg/dL — AB (ref 0.61–1.24)
Chloride: 99 mmol/L — ABNORMAL LOW (ref 101–111)
GFR calc non Af Amer: 31 mL/min — ABNORMAL LOW (ref >60)
GFR, EST AFRICAN AMERICAN: 36 mL/min — AB (ref >60)
Glucose, Bld: 86 mg/dL (ref 65–99)
Potassium: 3.1 mmol/L — ABNORMAL LOW (ref 3.5–5.1)
Sodium: 139 mmol/L (ref 135–145)

## 2017-04-29 LAB — TROPONIN I
TROPONIN I: 0.04 ng/mL — AB (ref <0.03)
Troponin I: 0.04 ng/mL (ref <0.03)

## 2017-04-29 LAB — CBC
HCT: 41.2 % (ref 39.0–52.0)
Hemoglobin: 14.4 g/dL (ref 13.0–17.0)
MCH: 31.7 pg (ref 26.0–34.0)
MCHC: 35 g/dL (ref 30.0–36.0)
MCV: 90.7 fL (ref 78.0–100.0)
PLATELETS: 340 10*3/uL (ref 150–400)
RBC: 4.54 MIL/uL (ref 4.22–5.81)
RDW: 13.4 % (ref 11.5–15.5)
WBC: 10.4 10*3/uL (ref 4.0–10.5)

## 2017-04-29 MED ORDER — HYDROMORPHONE HCL 1 MG/ML IJ SOLN
1.0000 mg | Freq: Once | INTRAMUSCULAR | Status: AC
  Administered 2017-04-29: 1 mg via INTRAMUSCULAR
  Filled 2017-04-29: qty 1

## 2017-04-29 MED ORDER — ONDANSETRON HCL 4 MG/2ML IJ SOLN: 4.0000 mg | Freq: Four times a day (QID) | INTRAMUSCULAR | Status: DC | PRN

## 2017-04-29 MED ORDER — POTASSIUM CHLORIDE IN NACL 20-0.9 MEQ/L-% IV SOLN
INTRAVENOUS | Status: AC
  Administered 2017-04-29: 23:00:00 via INTRAVENOUS

## 2017-04-29 MED ORDER — SODIUM CHLORIDE 0.9 % IV BOLUS (SEPSIS)
1000.0000 mL | Freq: Once | INTRAVENOUS | Status: AC
  Administered 2017-04-29: 1000 mL via INTRAVENOUS

## 2017-04-29 MED ORDER — TOPIRAMATE 25 MG PO TABS
25.0000 mg | ORAL_TABLET | Freq: Two times a day (BID) | ORAL | Status: DC
  Administered 2017-04-29 – 2017-04-30 (×2): 25 mg via ORAL
  Filled 2017-04-29: qty 1

## 2017-04-29 MED ORDER — AMLODIPINE BESYLATE 5 MG PO TABS
10.0000 mg | ORAL_TABLET | Freq: Every day | ORAL | Status: DC
  Administered 2017-04-30: 10 mg via ORAL
  Filled 2017-04-29: qty 2

## 2017-04-29 MED ORDER — MORPHINE SULFATE (PF) 2 MG/ML IV SOLN: 1.0000 mg | INTRAVENOUS | Status: DC | PRN

## 2017-04-29 MED ORDER — HYDRALAZINE HCL 20 MG/ML IJ SOLN: 10.0000 mg | INTRAMUSCULAR | Status: DC | PRN

## 2017-04-29 MED ORDER — POTASSIUM CHLORIDE CRYS ER 20 MEQ PO TBCR
20.0000 meq | EXTENDED_RELEASE_TABLET | Freq: Two times a day (BID) | ORAL | Status: DC
  Administered 2017-04-29: 20 meq via ORAL
  Filled 2017-04-29: qty 1

## 2017-04-29 MED ORDER — ACETAMINOPHEN 325 MG PO TABS
650.0000 mg | ORAL_TABLET | ORAL | Status: DC | PRN
Start: 2017-04-29 — End: 2017-04-30

## 2017-04-29 MED ORDER — HEPARIN SODIUM (PORCINE) 5000 UNIT/ML IJ SOLN
INTRAMUSCULAR | Status: AC
  Filled 2017-04-29: qty 1

## 2017-04-29 MED ORDER — BISOPROLOL FUMARATE 5 MG PO TABS
10.0000 mg | ORAL_TABLET | Freq: Every day | ORAL | Status: DC
Start: 2017-04-30 — End: 2017-04-30
  Administered 2017-04-30: 10 mg via ORAL
  Filled 2017-04-29: qty 1
  Filled 2017-04-29: qty 2
  Filled 2017-04-29 (×2): qty 1

## 2017-04-29 MED ORDER — ASPIRIN 81 MG PO CHEW
81.0000 mg | CHEWABLE_TABLET | Freq: Every day | ORAL | Status: DC
  Administered 2017-04-30: 81 mg via ORAL
  Filled 2017-04-29: qty 1

## 2017-04-29 MED ORDER — HEPARIN SODIUM (PORCINE) 5000 UNIT/ML IJ SOLN
5000.0000 [IU] | Freq: Three times a day (TID) | INTRAMUSCULAR | Status: DC
  Administered 2017-04-29: 5000 [IU] via SUBCUTANEOUS
  Filled 2017-04-29: qty 1

## 2017-04-29 MED ORDER — NITROGLYCERIN 0.4 MG SL SUBL: 0.4000 mg | SUBLINGUAL_TABLET | SUBLINGUAL | Status: DC | PRN

## 2017-04-29 NOTE — H&P (Signed)
History and Physical    MUBASHIR MALLEK ZOX:[id number] DOB: October 26, 1950 DOA: 04/29/2017  PCP: Elfredia Nevins, MD   Patient coming from: Home   Chief Complaint: Chest pain   HPI: Thomas [name] is a 66 year-old male with medical history significant for dementia, hypertension, and cervical spinal stenosis, now presenting to the emergency department for evaluation chest pain. Patient reports that he had been in his usual state until developing dull pain in his chest 2 days ago. He reports multiple episodes since that time, lasting just a few minutes each, not radiating, and not associated with shortness of breath, nausea, or diaphoresis. He is unable to identify any exacerbating factors, but reports that his symptoms are sometimes relieved by aspirin or Advil. Denies recent fevers or chills and denies any significant cough or dyspnea.  ED Course: Upon arrival to the ED, patient is found to be afebrile, saturating well on room air, slightly bradycardic, and with vitals otherwise stable. EKG features a sinus rhythm with PVCs and nonspecific T-wave abnormalities. Chest x-ray is negative for acute cardiopulmonary disease. Chemistry panel reveals a potassium of 3.1, BUN of 30, and creatinine of 2.06, up from 1.1 in June 2018. CBC is unremarkable. Troponin is slightly elevated to 0.04. Patient was treated with 1 L of normal saline and Dilaudid in the emergency department. Cardiology was consulted by the ED physician, has evaluated patient in the emergency department, and recommends observing the patient in the hospital with serial cardiac enzyme measurements. Patient remained minimally stable in the ED and will be observed on the telemetry unit for ongoing evaluation and management of intermittent chest discomfort with slight elevation in troponin and acute kidney injury.  Review of Systems:  All other systems reviewed and apart from HPI, are negative.  Past Medical History:  Diagnosis Date  . Altered mental  status   . Confusion   . Hypertension   . Memory loss   . Seizures (HCC)    years ago  . Spinal stenosis     Past Surgical History:  Procedure Laterality Date  . ABDOMINAL SURGERY     abdominal aneurysm      reports that he has never smoked. He has never used smokeless tobacco. He reports that he does not drink alcohol or use drugs.  No Known Allergies  Family History  Problem Relation Age of Onset  . Stroke Mother   . Hypertension Sister   . Diabetes Brother        x 2     Prior to Admission medications   Medication Sig Start Date End Date Taking? Authorizing Provider  aspirin 81 MG tablet Take 81 mg by mouth daily.   Yes [provider]  bisoprolol (ZEBETA) 10 MG tablet Take 1 tablet (10 mg total) by mouth daily. 12/24/16  Yes BranchDorothe Pea, MD  cetirizine (ZYRTEC) 10 MG tablet Take 10 mg by mouth daily as needed for allergies.  04/11/17  Yes [provider]  meloxicam (MOBIC) 7.5 MG tablet Take 7.5 mg by mouth 2 (two) times daily. 03/11/17  Yes [provider]  potassium chloride SA (K-DUR,KLOR-CON) 20 MEQ tablet Take 1 tablet (20 mEq total) by mouth daily. 04/11/17  Yes Jodelle Gross, NP  amLODipine (NORVASC) 10 MG tablet Take 1 tablet (10 mg total) by mouth daily. 03/04/17   Jodelle Gross, NP  chlorthalidone (HYGROTON) 25 MG tablet Take 1 tablet (25 mg total) by mouth daily. 04/11/17 07/10/17  Jodelle Gross, NP  topiramate (TOPAMAX) 25 MG tablet Take 1 tablet (25 mg total) by mouth 2 (two) times daily. 01/24/17   Micki Riley, MD    Physical Exam: Vitals:   04/29/17 1326 04/29/17 1328 04/29/17 1628  BP:  112/78 122/87  Pulse:  65 (!) 53  Resp:  20 19  Temp:  98.4 F (36.9 C)   TempSrc:  Oral   SpO2:  94% 96%  Weight: 85.7 kg (189 lb)    Height: 5\' 8"  (1.727 m)        Constitutional: NAD, calm, comfortable Eyes: PERTLA, lids and conjunctivae normal. Dysconjugate gaze.  ENMT: Mucous membranes are moist. Posterior  pharynx clear of any exudate or lesions.   Neck: normal, supple, no masses, no thyromegaly Respiratory: clear to auscultation bilaterally, no wheezing, no crackles. Normal respiratory effort.   Cardiovascular: S1 & S2 heard, regular rate and rhythm. No extremity edema. No significant JVD. Abdomen: No distension, no tenderness, no masses palpated. Bowel sounds normal.  Musculoskeletal: no clubbing / cyanosis. No joint deformity upper and lower extremities.    Skin: no significant rashes, lesions, ulcers. Warm, dry, well-perfused. Neurologic: No gross facial asymmetry. Sensation intact, DTR normal. Strength 5/5 in all 4 limbs.  Psychiatric: Alert and oriented x 3. Pleasant and cooperative.     Labs on Admission: I have personally reviewed following labs and imaging studies  CBC:  Recent Labs Lab 04/29/17 1430  WBC 10.4  HGB 14.4  HCT 41.2  MCV 90.7  PLT 340   Basic Metabolic Panel:  Recent Labs Lab 04/29/17 1430  NA 139  K 3.1*  CL 99*  CO2 28  GLUCOSE 86  BUN 30*  CREATININE 2.06*  CALCIUM 9.8   GFR: Estimated Creatinine Clearance: 36.6 mL/min (A) (by C-G formula based on SCr of 2.06 mg/dL (H)). Liver Function Tests: No results for input(s): AST, ALT, ALKPHOS, BILITOT, PROT, ALBUMIN in the last 168 hours. No results for input(s): LIPASE, AMYLASE in the last 168 hours. No results for input(s): AMMONIA in the last 168 hours. Coagulation Profile: No results for input(s): INR, PROTIME in the last 168 hours. Cardiac Enzymes:  Recent Labs Lab 04/29/17 1430  TROPONINI 0.04*   BNP (last 3 results) No results for input(s): PROBNP in the last 8760 hours. HbA1C: No results for input(s): HGBA1C in the last 72 hours. CBG: No results for input(s): GLUCAP in the last 168 hours. Lipid Profile: No results for input(s): CHOL, HDL, LDLCALC, TRIG, CHOLHDL, LDLDIRECT in the last 72 hours. Thyroid Function Tests: No results for input(s): TSH, T4TOTAL, FREET4, T3FREE, THYROIDAB  in the last 72 hours. Anemia Panel: No results for input(s): VITAMINB12, FOLATE, FERRITIN, TIBC, IRON, RETICCTPCT in the last 72 hours. Urine analysis:    Component Value Date/Time   COLORURINE ORANGE BIOCHEMICALS MAY BE AFFECTED BY COLOR (A) 05/22/2010 1919   APPEARANCEUR CLEAR 05/22/2010 1919   LABSPEC >1.030 (H) 05/22/2010 1919   PHURINE 5.5 05/22/2010 1919   GLUCOSEU 100 (A) 05/22/2010 1919   HGBUR TRACE (A) 05/22/2010 1919   BILIRUBINUR SMALL (A) 05/22/2010 1919   KETONESUR NEGATIVE 05/22/2010 1919   PROTEINUR 30 (A) 05/22/2010 1919   UROBILINOGEN 4.0 (H) 05/22/2010 1919   NITRITE NEGATIVE 05/22/2010 1919   LEUKOCYTESUR NEGATIVE 05/22/2010 1919   Sepsis Labs: @LABRCNTIP (procalcitonin:4,lacticidven:4) )No results found for this or any previous visit (from the past 240 hour(s)).   Radiological Exams on Admission: Dg Chest 2 View  Result Date: 04/29/2017 CLINICAL DATA:  Chest pain. EXAM: CHEST  2  VIEW COMPARISON:  Chest x-ray dated February 23, 2017. FINDINGS: The cardiomediastinal silhouette is normal in size. Normal pulmonary vascularity. No focal consolidation, pleural effusion, or pneumothorax. No acute osseous abnormality. IMPRESSION: No active cardiopulmonary disease. Electronically Signed   By: Obie DredgeWilliam T Derry M.D.   On: 04/29/2017 13:54    EKG: Independently reviewed. Sinus rhythm, PVC's, non-specific T-wave abnormalities.   Assessment/Plan  1. Chest pain, elevated troponin - Pt presents with 2 days of intermittent chest pain - EKG with non-specific T-wave abnormality, CXR unremarkable, initial troponin 0.04 - Cardiology is consulting and much appreciated, recommending cardiac monitoring and serial troponin measurements  - Continue cardiac monitoring, check troponin levels q6h, continue ASA and beta-blocker  2. Acute kidney injury  - SCr is 2.06 on admission, up from 1.1 in June '18  - Suspected secondary to thiazide diuretic and NSAID's for his neck pain  - He was  treated with 1 liter NS in ED   - Plan to stop chlorthalidone and NSAID, continue IVF hydration, check renal US, and repeat chemistries in am   3. Hypokalemia  - Serum potassium is 3.1 in ED, possibly from chlorthalidone  - Chlorthalidone stopped, his daily potassium supplement increased to BID, and KCl added to IVF  - Monitor on telemetry, repeat chem panel in am    4. Hypertension  - BP is at goal  - Chlorthalidone stopped per cardiology recommendation  - Continue Norvasc and bisoprolol  - Add hydralazine prn     DVT prophylaxis: sq heparin Code Status: Full  Family Communication: Discussed with patient Disposition Plan: Observe on telemetry Consults called: Cardiology Admission status: Observation    Briscoe Deutscherimothy S Infantof Villagomez, MD [facility] Pager [phone]  If 7PM-7AM, please contact night-coverage [email or web address] Password [email or web address]  04/29/2017, 5:35 PM

## 2017-04-29 NOTE — ED Provider Notes (Signed)
[facility]-EMERGENCY DEPT Provider Note   CSN: [id number] Arrival date & time: 04/29/17  1320     History   Chief Complaint Chief Complaint  Patient presents with  . Chest Pain    HPI Thomas [name] is a 66 y.o. male.  Patient states he's been having chest pain off and on the last 3 days. He has a history of atypical chest pain   The history is provided by the patient.  Chest Pain   This is a recurrent problem. The current episode started more than 2 days ago. The problem occurs constantly. The problem has been resolved. The pain is associated with walking. The pain is present in the lateral region. The pain is at a severity of 6/10. The pain is moderate. The quality of the pain is described as brief. The pain does not radiate. Pertinent negatives include no abdominal pain, no back pain, no cough and no headaches.  Pertinent negatives for past medical history include no seizures.    Past Medical History:  Diagnosis Date  . Altered mental status   . Confusion   . Hypertension   . Memory loss   . Seizures (HCC)    years ago  . Spinal stenosis     Patient Active Problem List   Diagnosis Date Noted  . AKI (acute kidney injury) (HCC) 04/29/2017  . Hypokalemia 04/29/2017  . Cervical spinal stenosis 04/29/2017  . Chest pain 04/29/2017  . Essential hypertension 03/04/2017  . Spondylosis of cervical spine with myelopathy 03/04/2017  . Dementia 11/04/2015  . Altered mental status     Past Surgical History:  Procedure Laterality Date  . ABDOMINAL SURGERY     abdominal aneurysm        Home Medications    Prior to Admission medications   Medication Sig Start Date End Date Taking? Authorizing Provider  aspirin 81 MG tablet Take 81 mg by mouth daily.   Yes [provider]  bisoprolol (ZEBETA) 10 MG tablet Take 1 tablet (10 mg total) by mouth daily. 12/24/16  Yes BranchDorothe Pea, Jonathan F, MD  cetirizine (ZYRTEC) 10 MG tablet Take 10 mg by mouth daily as needed for  allergies.  04/11/17  Yes [provider]  meloxicam (MOBIC) 7.5 MG tablet Take 7.5 mg by mouth 2 (two) times daily. 03/11/17  Yes [provider]  potassium chloride SA (K-DUR,KLOR-CON) 20 MEQ tablet Take 1 tablet (20 mEq total) by mouth daily. 04/11/17  Yes Jodelle GrossLawrence, Kathryn M, NP  amLODipine (NORVASC) 10 MG tablet Take 1 tablet (10 mg total) by mouth daily. 03/04/17   Jodelle GrossLawrence, Kathryn M, NP  chlorthalidone (HYGROTON) 25 MG tablet Take 1 tablet (25 mg total) by mouth daily. 04/11/17 07/10/17  Jodelle GrossLawrence, Kathryn M, NP  topiramate (TOPAMAX) 25 MG tablet Take 1 tablet (25 mg total) by mouth 2 (two) times daily. 01/24/17   Micki RileySethi, Pramod S, MD    Family History Family History  Problem Relation Age of Onset  . Stroke Mother   . Hypertension Sister   . Diabetes Brother        x 2    Social History Social History  Substance Use Topics  . Smoking status: Never Smoker  . Smokeless tobacco: Never Used  . Alcohol use No     Comment: quit 8 months ago (12/29/2016)     Allergies   Patient has no known allergies.   Review of Systems Review of Systems  Constitutional: Negative for appetite change and fatigue.  HENT: Negative  for congestion, ear discharge and sinus pressure.   Eyes: Negative for discharge.  Respiratory: Negative for cough.   Cardiovascular: Positive for chest pain.  Gastrointestinal: Negative for abdominal pain and diarrhea.  Genitourinary: Negative for frequency and hematuria.  Musculoskeletal: Negative for back pain.  Skin: Negative for rash.  Neurological: Negative for seizures and headaches.  Psychiatric/Behavioral: Negative for hallucinations.     Physical Exam Updated Vital Signs BP 122/87 (BP Location: Left Arm)   Pulse (!) 53   Temp 98.4 F (36.9 C) (Oral)   Resp 19   Ht 5\' 8"  (1.727 m)   Wt 85.7 kg (189 lb)   SpO2 96%   BMI 28.74 kg/m   Physical Exam  Constitutional: He is oriented to person, place, and time. He appears well-developed.   HENT:  Head: Normocephalic.  Eyes: Conjunctivae and EOM are normal. No scleral icterus.  Neck: Neck supple. No thyromegaly present.  Cardiovascular: Normal rate and regular rhythm.  Exam reveals no gallop and no friction rub.   No murmur heard. Pulmonary/Chest: No stridor. He has no wheezes. He has no rales. He exhibits no tenderness.  Abdominal: He exhibits no distension. There is no tenderness. There is no rebound.  Musculoskeletal: Normal range of motion. He exhibits no edema.  Lymphadenopathy:    He has no cervical adenopathy.  Neurological: He is oriented to person, place, and time. He exhibits normal muscle tone. Coordination normal.  Skin: No rash noted. No erythema.  Psychiatric: He has a normal mood and affect. His behavior is normal.     ED Treatments / Results  Labs (all labs ordered are listed, but only abnormal results are displayed) Labs Reviewed  BASIC METABOLIC PANEL - Abnormal; Notable for the following:       Result Value   Potassium 3.1 (*)    Chloride 99 (*)    BUN 30 (*)    Creatinine, Ser 2.06 (*)    GFR calc non Af Amer 31 (*)    GFR calc Af Amer 36 (*)    All other components within normal limits  TROPONIN I - Abnormal; Notable for the following:    Troponin I 0.04 (*)    All other components within normal limits  CBC  TROPONIN I  TROPONIN I  BASIC METABOLIC PANEL  CBC    EKG  EKG Interpretation  Date/Time:  Friday April 29 2017 13:24:17 EDT Ventricular Rate:  63 PR Interval:  196 QRS Duration: 100 QT Interval:  426 QTC Calculation: 435 R Axis:   -14 Text Interpretation:  Sinus rhythm with occasional Premature ventricular complexes Otherwise normal ECG Since last tracing rate faster and PVC is new Confirmed by Mancel Bale [phone]) on 04/29/2017 1:46:18 PM Also confirmed by Bethann Berkshire [phone])  on 04/29/2017 3:11:56 PM Also confirmed by Bethann Berkshire [phone])  on 04/29/2017 3:52:57 PM       Radiology Dg Chest 2 View  Result Date:  04/29/2017 CLINICAL DATA:  Chest pain. EXAM: CHEST  2 VIEW COMPARISON:  Chest x-ray dated February 23, 2017. FINDINGS: The cardiomediastinal silhouette is normal in size. Normal pulmonary vascularity. No focal consolidation, pleural effusion, or pneumothorax. No acute osseous abnormality. IMPRESSION: No active cardiopulmonary disease. Electronically Signed   By: Obie Dredge M.D.   On: 04/29/2017 13:54    Procedures Procedures (including critical care time)  Medications Ordered in ED Medications  potassium chloride SA (K-DUR,KLOR-CON) CR tablet 20 mEq (not administered)  amLODipine (NORVASC) tablet 10 mg (not administered)  topiramate (  TOPAMAX) tablet 25 mg (not administered)  bisoprolol (ZEBETA) tablet 10 mg (not administered)  aspirin tablet 81 mg (not administered)  nitroGLYCERIN (NITROSTAT) SL tablet 0.4 mg (not administered)  acetaminophen (TYLENOL) tablet 650 mg (not administered)  ondansetron (ZOFRAN) injection 4 mg (not administered)  heparin injection 5,000 Units (not administered)  hydrALAZINE (APRESOLINE) injection 10 mg (not administered)  0.9 % NaCl with KCl 20 mEq/ L  infusion (not administered)  HYDROmorphone (DILAUDID) injection 1 mg (1 mg Intramuscular Given 04/29/17 1627)  sodium chloride 0.9 % bolus 1,000 mL (1,000 mLs Intravenous New Bag/Given 04/29/17 1628)     Initial Impression / Assessment and Plan / ED Course  I have reviewed the triage vital signs and the nursing notes.  Pertinent labs & imaging results that were available during my care of the patient were reviewed by me and considered in my medical decision making (see chart for details).     Patient seen by cardiology because he had an elevated troponin. cardiology recommends admission to medicine.  Final Clinical Impressions(s) / ED Diagnoses   Final diagnoses:  Atypical chest pain    New Prescriptions New Prescriptions   No medications on file     Bethann Berkshire, MD 04/29/17 1756

## 2017-04-29 NOTE — ED Triage Notes (Signed)
Chest pain for 2 days.  Pt denies any other symptoms.

## 2017-04-29 NOTE — Consult Note (Signed)
Primary cardiologist: Dr Dina RichJonathan Jamorris Ndiaye MD Consulting cardiologist: Dr Dina RichJonathan Mazin Emma MD Requesting physician: Dr Estell HarpinZammit Indication: chest pain  Clinical Summary Mr. Genao is a 66 y.o.male history of chronic chest pain, dementia, HTN , cervical stenosis, presents with chest pain. He had prior chest pain that we had been following in clinic, and with increased bp control symptoms had resolved and we did not have to pursue ischemic testing.   Reports chest pain on and off since Wednesday. [place] pain left chest, last for about 3-4 minutes. Multiple episodes at a time. Similar to prior chest pain, Can be better with aspirin or advil. No significant other symptoms. Not positional   CXR no acute process K 3.1, Cr 2.06, Hgb 14.4, Plt 340 Trop 0.04--> EKG SR, chronic nonspecific ST/T changes    No Known Allergies  Medications Scheduled Medications: .  HYDROmorphone (DILAUDID) injection  1 mg Intramuscular Once     Infusions: . sodium chloride       PRN Medications:     Past Medical History:  Diagnosis Date  . Altered mental status   . Confusion   . Hypertension   . Memory loss   . Seizures (HCC)    years ago  . Spinal stenosis     Past Surgical History:  Procedure Laterality Date  . ABDOMINAL SURGERY     abdominal aneurysm     Family History  Problem Relation Age of Onset  . Stroke Mother   . Hypertension Sister   . Diabetes Brother        x 2    Social History Mr. Mura reports that he has never smoked. He has never used smokeless tobacco. [name] reports that he does not drink alcohol.  Review of Systems CONSTITUTIONAL: No weight loss, fever, chills, weakness or fatigue.  HEENT: Eyes: No visual loss, blurred vision, double vision or yellow sclerae. No hearing loss, sneezing, congestion, runny nose or sore throat.  SKIN: No rash or itching.  CARDIOVASCULAR: per hpi RESPIRATORY: No shortness of breath, cough or sputum.  GASTROINTESTINAL: No  anorexia, nausea, vomiting or diarrhea. No abdominal pain or blood.  GENITOURINARY: no polyuria, no dysuria NEUROLOGICAL: No headache, dizziness, syncope, paralysis, ataxia, numbness or tingling in the extremities. No change in bowel or bladder control.  MUSCULOSKELETAL: No muscle, back pain, joint pain or stiffness.  HEMATOLOGIC: No anemia, bleeding or bruising.  LYMPHATICS: No enlarged nodes. No history of splenectomy.  PSYCHIATRIC: No history of depression or anxiety.      Physical Examination Blood pressure 112/78, pulse 65, temperature 98.4 F (36.9 C), temperature source Oral, resp. rate 20, height 5\' 8"  (1.727 m), weight 189 lb (85.7 kg), SpO2 94 %. No intake or output data in the 24 hours ending 04/29/17 1604  HEENT: sclera clear, throat clear  Cardiovascular: RRR, no m/r/g, no jvd  Respiratory: CTAB  GI: abdomen soft, NT, ND  MSK: no LE edema  Neuro: no focal deficits  Psych: appropriate affect   Lab Results  Basic Metabolic Panel:  Recent Labs Lab 04/29/17 1430  NA 139  K 3.1*  CL 99*  CO2 28  GLUCOSE 86  BUN 30*  CREATININE 2.06*  CALCIUM 9.8    Liver Function Tests: No results for input(s): AST, ALT, ALKPHOS, BILITOT, PROT, ALBUMIN in the last 168 hours.  CBC:  Recent Labs Lab 04/29/17 1430  WBC 10.4  HGB 14.4  HCT 41.2  MCV 90.7  PLT 340    Cardiac Enzymes:  Recent  Labs Lab 04/29/17 1430  TROPONINI 0.04*    BNP: Invalid input(s): POCBNP   ECG   Imaging   Impression/Recommendations 1. Chest pain - prior history of chest pain, symptoms had resolved with bp control and thus ischemic testing was not pursude - recurrence of pain despite adequate bp control - symptoms not overally consistent with cardiac ischemia - looking back he has a chronic mildly elevated troponin around 0.03.  - from cardiac stanpdoint would cycle enzymes and EKG overnight, if no progression of troponin would be ok for discharge and plans for  outpatient lexiscan, which he will also need prior to upcoming neck surgery.   2. AKI - recently started on chlorthalidone, also heavy NSAID use with combined advil, aspirin, and meloxicam - stop chlorthalidone, will need IVFs and monitoring of renal function overnight  3. Hypokalemia - likely due to chlorthalidone combined with NSAID use  4. HTN - stop chlorthalidone, if additional agent needed start hydralzine.    Given his chest pain, mild troponin, and significant AKI would recommend observation overnight.  Dina Rich, M.D

## 2017-04-29 NOTE — ED Notes (Signed)
CRITICAL VALUE ALERT Critical Value:  Troponin 0.04  Date & Time Notied:  1545  Provider Notified:zammitt  Orders Received/Actions taken: none

## 2017-04-30 ENCOUNTER — Observation Stay (HOSPITAL_COMMUNITY): Payer: Medicare Other

## 2017-04-30 DIAGNOSIS — R079 Chest pain, unspecified: Secondary | ICD-10-CM

## 2017-04-30 DIAGNOSIS — M4802 Spinal stenosis, cervical region: Secondary | ICD-10-CM

## 2017-04-30 DIAGNOSIS — R0789 Other chest pain: Secondary | ICD-10-CM | POA: Diagnosis not present

## 2017-04-30 DIAGNOSIS — N179 Acute kidney failure, unspecified: Secondary | ICD-10-CM

## 2017-04-30 DIAGNOSIS — E876 Hypokalemia: Secondary | ICD-10-CM

## 2017-04-30 DIAGNOSIS — I10 Essential (primary) hypertension: Secondary | ICD-10-CM | POA: Diagnosis not present

## 2017-04-30 LAB — CBC
HCT: 36.3 % — ABNORMAL LOW (ref 39.0–52.0)
Hemoglobin: 12.9 g/dL — ABNORMAL LOW (ref 13.0–17.0)
MCH: 31.9 pg (ref 26.0–34.0)
MCHC: 35.5 g/dL (ref 30.0–36.0)
MCV: 89.9 fL (ref 78.0–100.0)
PLATELETS: 294 10*3/uL (ref 150–400)
RBC: 4.04 MIL/uL — ABNORMAL LOW (ref 4.22–5.81)
RDW: 13.3 % (ref 11.5–15.5)
WBC: 10.2 10*3/uL (ref 4.0–10.5)

## 2017-04-30 LAB — TROPONIN I
TROPONIN I: 0.03 ng/mL — AB (ref <0.03)
Troponin I: 0.03 ng/mL (ref <0.03)

## 2017-04-30 LAB — BASIC METABOLIC PANEL
ANION GAP: 9 (ref 5–15)
BUN: 24 mg/dL — AB (ref 6–20)
CALCIUM: 8.8 mg/dL — AB (ref 8.9–10.3)
CHLORIDE: 103 mmol/L (ref 101–111)
CO2: 26 mmol/L (ref 22–32)
CREATININE: 1.37 mg/dL — AB (ref 0.61–1.24)
GFR calc Af Amer: 60 mL/min — ABNORMAL LOW (ref >60)
GFR, EST NON AFRICAN AMERICAN: 51 mL/min — AB (ref >60)
GLUCOSE: 117 mg/dL — AB (ref 65–99)
POTASSIUM: 2.7 mmol/L — AB (ref 3.5–5.1)
SODIUM: 138 mmol/L (ref 135–145)

## 2017-04-30 LAB — POTASSIUM: Potassium: 3.8 mmol/L (ref 3.5–5.1)

## 2017-04-30 LAB — MAGNESIUM: Magnesium: 1.9 mg/dL (ref 1.7–2.4)

## 2017-04-30 MED ORDER — POTASSIUM CHLORIDE CRYS ER 20 MEQ PO TBCR
40.0000 meq | EXTENDED_RELEASE_TABLET | Freq: Once | ORAL | Status: AC
  Administered 2017-04-30: 40 meq via ORAL
  Filled 2017-04-30: qty 2

## 2017-04-30 MED ORDER — POTASSIUM CHLORIDE 10 MEQ/100ML IV SOLN
10.0000 meq | INTRAVENOUS | Status: AC
  Administered 2017-04-30 (×4): 10 meq via INTRAVENOUS
  Filled 2017-04-30: qty 100

## 2017-04-30 MED ORDER — POTASSIUM CHLORIDE CRYS ER 20 MEQ PO TBCR
40.0000 meq | EXTENDED_RELEASE_TABLET | Freq: Two times a day (BID) | ORAL | Status: DC
  Administered 2017-04-30 (×2): 40 meq via ORAL
  Filled 2017-04-30 (×2): qty 2

## 2017-04-30 NOTE — Discharge Summary (Signed)
Physician Discharge Summary  Thomas FootLouis M [name] RUE:[id number] DOB: 11/14/1950 DOA: 04/29/2017  PCP: Elfredia NevinsFusco, Lawrence, MD  Admit date: 04/29/2017 Discharge date: 04/30/2017  Admitted From: home Disposition:  home  Recommendations for Outpatient Follow-up:  1. Follow up with PCP in 1-2 weeks 2. Please obtain BMP/CBC in one week 3. Follow-up with cardiology as an outpatient for stress testing  Home Health: Equipment/Devices:  Discharge Condition: stable CODE STATUS:full code Diet recommendation: Heart Healthy   Brief/Interim Summary: 66 year old male with history of hypertension, cervical spinal stenosis, presents to the hospital with chest pain. He was seen by cardiology in the emergency room who felt that his symptoms were atypical. Recommendations were for overnight observation and cycling of cardiac markers. Patient ruled out for ACS with no significant rise in cardiac markers. EKG did not have any specific changes. He was noted to have elevated creatinine at 2.06 and hypokalemia. It was noted that he was on chlorthalidone. This was held on admission and he was started on IV hydration. His follow-up creatinine was improved. Potassium was corrected. Per cardiology note, since he is ruled out for ACS and does not have any further symptoms, he can be discharged and follow-up with cardiology as an outpatient. He will need a stress test to be performed prior to his upcoming surgery on his neck. We'll continue to chlorthalidone and discontinue NSAIDs due to recent renal failure. Patient is otherwise stable for discharge home today.  Discharge Diagnoses:  Principal Problem:   Chest pain Active Problems:   Essential hypertension   AKI (acute kidney injury) (HCC)   Hypokalemia   Cervical spinal stenosis    Discharge Instructions  Discharge Instructions    Diet - low sodium heart healthy    Complete by:  As directed    Increase activity slowly    Complete by:  As directed      Allergies as  of 04/30/2017   No Known Allergies     Medication List    STOP taking these medications   chlorthalidone 25 MG tablet Commonly known as:  HYGROTON   meloxicam 7.5 MG tablet Commonly known as:  MOBIC   potassium chloride SA 20 MEQ tablet Commonly known as:  K-DUR,KLOR-CON     TAKE these medications   amLODipine 10 MG tablet Commonly known as:  NORVASC Take 1 tablet (10 mg total) by mouth daily.   aspirin 81 MG tablet Take 81 mg by mouth daily.   bisoprolol 10 MG tablet Commonly known as:  ZEBETA Take 1 tablet (10 mg total) by mouth daily.   cetirizine 10 MG tablet Commonly known as:  ZYRTEC Take 10 mg by mouth daily as needed for allergies.   topiramate 25 MG tablet Commonly known as:  TOPAMAX Take 1 tablet (25 mg total) by mouth 2 (two) times daily.      Follow-up Information    Branch, Dorothe PeaJonathan F, MD Follow up.   Specialty:  Cardiology Why:  office will call you with appointment for stress test Contact information: [address] [place] [address] [phone]          No Known Allergies  Consultations:  Cardiology   Procedures/Studies: Dg Chest 2 View  Result Date: 04/29/2017 CLINICAL DATA:  Chest pain. EXAM: CHEST  2 VIEW COMPARISON:  Chest x-ray dated February 23, 2017. FINDINGS: The cardiomediastinal silhouette is normal in size. Normal pulmonary vascularity. No focal consolidation, pleural effusion, or pneumothorax. No acute osseous abnormality. IMPRESSION: No active cardiopulmonary disease. Electronically Signed  By: Obie Dredge M.D.   On: 04/29/2017 13:54   US Renal  Result Date: 04/30/2017 CLINICAL DATA:  Acute kidney injury, hypertension, history of abdominal aortic aneurysm EXAM: RENAL / URINARY TRACT ULTRASOUND COMPLETE COMPARISON:  Abdominal ultrasound 12/07/2005 FINDINGS: Right Kidney: Length: 11.6 cm. Normal cortical thickness and echogenicity. No mass, hydronephrosis or shadowing calcification. Left Kidney: Length: 10.8 cm. Normal  cortical thickness and echogenicity. No mass, hydronephrosis or shadowing calcification. Bladder: Partially distended, no focal abnormalities. Ureteral jets were not visualized. Incidentally noted increased hepatic parenchymal echogenicity, likely representing fatty infiltration though this can be seen with cirrhosis and certain infiltrative disorders. IMPRESSION: Unremarkable renal ultrasound. Question fatty infiltration of liver as above. Electronically Signed   By: Ulyses Southward M.D.   On: 04/30/2017 10:33        Subjective: No further chest discomfort. No shortness of breath.  Discharge Exam: Vitals:   04/30/17 0641 04/30/17 1515  BP: 102/63 108/73  Pulse: (!) 58 64  Resp: 18 18  Temp: 98.3 F (36.8 C) 98.6 F (37 C)  SpO2: 100% 99%   Vitals:   04/29/17 2159 04/30/17 0042 04/30/17 0641 04/30/17 1515  BP: 106/67  102/63 108/73  Pulse: (!) 50 66 (!) 58 64  Resp: 18  18 18   Temp: 98.1 F (36.7 C) 98.6 F (37 C) 98.3 F (36.8 C) 98.6 F (37 C)  TempSrc: Oral Oral Oral   SpO2: 97% 100% 100% 99%  Weight:      Height:        General: Pt is alert, awake, not in acute distress Cardiovascular: RRR, S1/S2 +, no rubs, no gallops Respiratory: CTA bilaterally, no wheezing, no rhonchi Abdominal: Soft, NT, ND, bowel sounds + Extremities: no edema, no cyanosis    The results of significant diagnostics from this hospitalization (including imaging, microbiology, ancillary and laboratory) are listed below for reference.     Microbiology: No results found for this or any previous visit (from the past 240 hour(s)).   Labs: BNP (last 3 results) No results for input(s): BNP in the last 8760 hours. Basic Metabolic Panel:  Recent Labs Lab 04/29/17 1430 04/30/17 0213 04/30/17 0659 04/30/17 1615  NA 139 138  --   --   K 3.1* 2.7*  --  3.8  CL 99* 103  --   --   CO2 28 26  --   --   GLUCOSE 86 117*  --   --   BUN 30* 24*  --   --   CREATININE 2.06* 1.37*  --   --   CALCIUM  9.8 8.8*  --   --   MG  --   --  1.9  --    Liver Function Tests: No results for input(s): AST, ALT, ALKPHOS, BILITOT, PROT, ALBUMIN in the last 168 hours. No results for input(s): LIPASE, AMYLASE in the last 168 hours. No results for input(s): AMMONIA in the last 168 hours. CBC:  Recent Labs Lab 04/29/17 1430 04/30/17 0213  WBC 10.4 10.2  HGB 14.4 12.9*  HCT 41.2 36.3*  MCV 90.7 89.9  PLT 340 294   Cardiac Enzymes:  Recent Labs Lab 04/29/17 1430 04/29/17 1859 04/30/17 0213 04/30/17 0659  TROPONINI 0.04* 0.04* 0.03* 0.03*   BNP: Invalid input(s): POCBNP CBG: No results for input(s): GLUCAP in the last 168 hours. D-Dimer No results for input(s): DDIMER in the last 72 hours. Hgb A1c No results for input(s): HGBA1C in the last 72 hours. Lipid Profile No results  for input(s): CHOL, HDL, LDLCALC, TRIG, CHOLHDL, LDLDIRECT in the last 72 hours. Thyroid function studies No results for input(s): TSH, T4TOTAL, T3FREE, THYROIDAB in the last 72 hours.  Invalid input(s): FREET3 Anemia work up No results for input(s): VITAMINB12, FOLATE, FERRITIN, TIBC, IRON, RETICCTPCT in the last 72 hours. Urinalysis    Component Value Date/Time   COLORURINE ORANGE BIOCHEMICALS MAY BE AFFECTED BY COLOR (A) 05/22/2010 1919   APPEARANCEUR CLEAR 05/22/2010 1919   LABSPEC >1.030 (H) 05/22/2010 1919   PHURINE 5.5 05/22/2010 1919   GLUCOSEU 100 (A) 05/22/2010 1919   HGBUR TRACE (A) 05/22/2010 1919   BILIRUBINUR SMALL (A) 05/22/2010 1919   KETONESUR NEGATIVE 05/22/2010 1919   PROTEINUR 30 (A) 05/22/2010 1919   UROBILINOGEN 4.0 (H) 05/22/2010 1919   NITRITE NEGATIVE 05/22/2010 1919   LEUKOCYTESUR NEGATIVE 05/22/2010 1919   Sepsis Labs Invalid input(s): PROCALCITONIN,  WBC,  LACTICIDVEN Microbiology No results found for this or any previous visit (from the past 240 hour(s)).   Time coordinating discharge: Over 30 minutes  SIGNED:   Erick Blinks, MD  [facility] 04/30/2017,  5:15 PM Pager   If 7PM-7AM, please contact night-coverage [email or web address] Password [email or web address]

## 2017-04-30 NOTE — Progress Notes (Signed)
Pt potasium reported to be 2.1. Levels paged to Dr. Onalee Huaavid

## 2017-04-30 NOTE — Progress Notes (Signed)
Patient discharged with instructions, prescription, and care notes.  Verbalized understanding via teach back.  IV was removed and the site was WNL. Patient voiced no further complaints or concerns at the time of discharge.  Appointments scheduled per instructions.  Patient left the floor via w/c family  And staff in stable condition. 

## 2017-05-02 ENCOUNTER — Ambulatory Visit: Payer: Medicare Other | Admitting: Neurology

## 2017-05-03 ENCOUNTER — Telehealth: Payer: Self-pay

## 2017-05-03 DIAGNOSIS — R079 Chest pain, unspecified: Secondary | ICD-10-CM

## 2017-05-03 NOTE — Telephone Encounter (Signed)
Spoke with pt. Advised him th at we were going to order a stress test. I told him on the morning of the  test, not to take his norvasc. He voiced understanding. Order placed.

## 2017-05-03 NOTE — Telephone Encounter (Signed)
-----   Message from Antoine PocheJonathan F Branch, MD sent at 05/02/2017  2:32 PM EDT ----- Can we order a lexiscan for this patient for chest pain, hold norvasc  J BrancH MD

## 2017-05-10 ENCOUNTER — Encounter (HOSPITAL_COMMUNITY): Payer: Self-pay

## 2017-05-10 ENCOUNTER — Encounter (HOSPITAL_BASED_OUTPATIENT_CLINIC_OR_DEPARTMENT_OTHER)
Admission: RE | Admit: 2017-05-10 | Discharge: 2017-05-10 | Disposition: A | Discharge status: Home / Self Care | Payer: Medicare Other | Source: Ambulatory Visit | Attending: Cardiology | Admitting: Cardiology

## 2017-05-10 ENCOUNTER — Encounter (HOSPITAL_COMMUNITY)
Admission: RE | Admit: 2017-05-10 | Discharge: 2017-05-10 | Disposition: A | Discharge status: Home / Self Care | Payer: Medicare Other | Source: Ambulatory Visit | Attending: Cardiology | Admitting: Cardiology

## 2017-05-10 DIAGNOSIS — R079 Chest pain, unspecified: Secondary | ICD-10-CM | POA: Diagnosis not present

## 2017-05-10 LAB — NM MYOCAR MULTI W/SPECT W/WALL MOTION / EF
CHL CUP NUCLEAR SDS: 2
CHL CUP NUCLEAR SRS: 2
CHL CUP NUCLEAR SSS: 4
CSEPPHR: 99 {beats}/min
LV dias vol: 92 mL (ref 62–150)
LV sys vol: 28 mL
RATE: 0.36
Rest HR: 77 {beats}/min
TID: 0.95

## 2017-05-10 MED ORDER — SODIUM CHLORIDE 0.9% FLUSH
INTRAVENOUS | Status: AC
  Administered 2017-05-10: 10 mL via INTRAVENOUS
  Filled 2017-05-10: qty 10

## 2017-05-10 MED ORDER — TECHNETIUM TC 99M TETROFOSMIN IV KIT
10.0000 | PACK | Freq: Once | INTRAVENOUS | Status: AC | PRN
  Administered 2017-05-10: 10 via INTRAVENOUS

## 2017-05-10 MED ORDER — TECHNETIUM TC 99M TETROFOSMIN IV KIT
30.0000 | PACK | Freq: Once | INTRAVENOUS | Status: AC | PRN
  Administered 2017-05-10: 30 via INTRAVENOUS

## 2017-05-10 MED ORDER — REGADENOSON 0.4 MG/5ML IV SOLN
INTRAVENOUS | Status: AC
  Administered 2017-05-10: 0.4 mg via INTRAVENOUS
  Filled 2017-05-10: qty 5

## 2017-05-10 NOTE — Pre-Procedure Instructions (Signed)
Thomas [name]  05/10/2017      [id number] - [place], [name] - [address] Thomas [name] [address] [place] [address] Phone: [phone] Fax: [phone]    Your procedure is scheduled on May 12, 2017  Report to [facility] Admitting at 530 AM.  Call this number if you have problems the morning of surgery:  [phone]   Remember:  Do not eat food or drink liquids after midnight.  Take these medicines the morning of surgery with A SIP OF WATER amlodipine (norvasc), bisoprolol (zebeta), cetirizine (zyrtec), topiramate (topimax).  7 days prior to surgery STOP taking any Aspirin, Aleve, Naproxen, Ibuprofen, Motrin, Advil, Goody's, BC's, all herbal medications, fish oil, and all vitamins   Do not wear jewelry, make-up or nail polish.  Do not wear lotions, powders, or perfumes, or deoderant.  Men may shave face and neck.  Do not bring valuables to the hospital.  [facility] is not responsible for any belongings or valuables.  Contacts, dentures or bridgework may not be worn into surgery.  Leave your suitcase in the car.  After surgery it may be brought to your room.  For patients admitted to the hospital, discharge time will be determined by your treatment team.  Patients discharged the day of surgery will not be allowed to drive home.   Special instructions:   [facility]- Preparing For Surgery  Before surgery, you can play an important role. Because skin is not sterile, your skin needs to be as free of germs as possible. You can reduce the number of germs on your skin by washing with CHG (chlorahexidine gluconate) Soap before surgery.  CHG is an antiseptic cleaner which kills germs and bonds with the skin to continue killing germs even after washing.  Please do not use if you have an allergy to CHG or antibacterial soaps. If your skin becomes reddened/irritated stop using the CHG.  Do not shave (including  legs and underarms) for at least 48 hours prior to first CHG shower. It is OK to shave your face.  Please follow these instructions carefully.   1. Shower the NIGHT BEFORE SURGERY and the MORNING OF SURGERY with CHG.   2. If you chose to wash your hair, wash your hair first as usual with your normal shampoo.  3. After you shampoo, rinse your hair and body thoroughly to remove the shampoo.  4. Use CHG as you would any other liquid soap. You can apply CHG directly to the skin and wash gently with a scrungie or a clean washcloth.   5. Apply the CHG Soap to your body ONLY FROM THE NECK DOWN.  Do not use on open wounds or open sores. Avoid contact with your eyes, ears, mouth and genitals (private parts). Wash genitals (private parts) with your normal soap.  6. Wash thoroughly, paying special attention to the area where your surgery will be performed.  7. Thoroughly rinse your body with warm water from the neck down.  8. DO NOT shower/wash with your normal soap after using and rinsing off the CHG Soap.  9. Pat yourself dry with a CLEAN TOWEL.   10. Wear CLEAN PAJAMAS   11. Place CLEAN SHEETS on your bed the night of your first shower and DO NOT SLEEP WITH PETS.    Day of Surgery: Do not apply any deodorants/lotions. Please wear clean clothes to the hospital/surgery center.  Please read over the following fact sheets that you were given. Pain Booklet, Coughing and Deep Breathing, MRSA Information and Surgical Site Infection Prevention

## 2017-05-11 ENCOUNTER — Encounter (HOSPITAL_COMMUNITY): Payer: Self-pay

## 2017-05-11 ENCOUNTER — Encounter (HOSPITAL_COMMUNITY)
Admission: RE | Admit: 2017-05-11 | Discharge: 2017-05-11 | Disposition: A | Discharge status: Home / Self Care | Payer: Medicare Other | Source: Ambulatory Visit | Attending: Neurosurgery | Admitting: Neurosurgery

## 2017-05-11 HISTORY — DX: Unspecified osteoarthritis, unspecified site: M19.90

## 2017-05-11 LAB — SURGICAL PCR SCREEN
MRSA, PCR: NEGATIVE
Staphylococcus aureus: NEGATIVE

## 2017-05-11 LAB — BASIC METABOLIC PANEL
ANION GAP: 8 (ref 5–15)
BUN: 5 mg/dL — ABNORMAL LOW (ref 6–20)
CHLORIDE: 106 mmol/L (ref 101–111)
CO2: 26 mmol/L (ref 22–32)
CREATININE: 1.1 mg/dL (ref 0.61–1.24)
Calcium: 9.5 mg/dL (ref 8.9–10.3)
GFR calc non Af Amer: >60 mL/min (ref >60)
Glucose, Bld: 148 mg/dL — ABNORMAL HIGH (ref 65–99)
POTASSIUM: 2.8 mmol/L — AB (ref 3.5–5.1)
Sodium: 140 mmol/L (ref 135–145)

## 2017-05-11 LAB — CBC
HEMATOCRIT: 38.5 % — AB (ref 39.0–52.0)
HEMOGLOBIN: 13.3 g/dL (ref 13.0–17.0)
MCH: 31 pg (ref 26.0–34.0)
MCHC: 34.5 g/dL (ref 30.0–36.0)
MCV: 89.7 fL (ref 78.0–100.0)
Platelets: 302 10*3/uL (ref 150–400)
RBC: 4.29 MIL/uL (ref 4.22–5.81)
RDW: 14.1 % (ref 11.5–15.5)
WBC: 7.6 10*3/uL (ref 4.0–10.5)

## 2017-05-11 LAB — ABO/RH: ABO/RH(D): O POS

## 2017-05-11 MED ORDER — CEFAZOLIN SODIUM-DEXTROSE 2-4 GM/100ML-% IV SOLN
2.0000 g | INTRAVENOUS | Status: AC
  Administered 2017-05-12 (×2): 2 g via INTRAVENOUS
  Filled 2017-05-11: qty 100

## 2017-05-11 NOTE — Pre-Procedure Instructions (Addendum)
Thomas [name]  05/11/2017      [id number] - [place], [place] - [address] [name] [address] [place] [address] Phone: [phone] Fax: [phone]    Your procedure is scheduled on May 12, 2017  Report to [facility] Admitting at 530 AM.  Call this number if you have problems the morning of surgery:  [phone]   Remember:  Do not eat food or drink liquids after midnight.  Take these medicines the morning of surgery with A SIP OF WATER amlodipine (norvasc), bisoprolol (zebeta), cetirizine (zyrtec), topiramate (topimax).  7 days prior to surgery STOP Today  taking any Aspirin, Aleve, Naproxen, Ibuprofen, Motrin, Advil, Goody's, BC's, all herbal medications, fish oil, and all vitamins   Do not wear jewelry, make-up or nail polish.  Do not wear lotions, powders, or perfumes, or deoderant.  Men may shave face and neck.  Do not bring valuables to the hospital.  [facility] is not responsible for any belongings or valuables.  Contacts, dentures or bridgework may not be worn into surgery.  Leave your suitcase in the car.  After surgery it may be brought to your room.  For patients admitted to the hospital, discharge time will be determined by your treatment team.  Patients discharged the day of surgery will not be allowed to drive home.   Special instructions:   [facility]- Preparing For Surgery  Before surgery, you can play an important role. Because skin is not sterile, your skin needs to be as free of germs as possible. You can reduce the number of germs on your skin by washing with CHG (chlorahexidine gluconate) Soap before surgery.  CHG is an antiseptic cleaner which kills germs and bonds with the skin to continue killing germs even after washing.  Please do not use if you have an allergy to CHG or antibacterial soaps. If your skin becomes reddened/irritated stop using the CHG.  Do not shave  (including legs and underarms) for at least 48 hours prior to first CHG shower. It is OK to shave your face.  Please follow these instructions carefully.   1. Shower the NIGHT BEFORE SURGERY and the MORNING OF SURGERY with CHG.   2. If you chose to wash your hair, wash your hair first as usual with your normal shampoo.  3. After you shampoo, rinse your hair and body thoroughly to remove the shampoo.  4. Use CHG as you would any other liquid soap. You can apply CHG directly to the skin and wash gently with a scrungie or a clean washcloth.   5. Apply the CHG Soap to your body ONLY FROM THE NECK DOWN.  Do not use on open wounds or open sores. Avoid contact with your eyes, ears, mouth and genitals (private parts). Wash genitals (private parts) with your normal soap.  6. Wash thoroughly, paying special attention to the area where your surgery will be performed.  7. Thoroughly rinse your body with warm water from the neck down.  8. DO NOT shower/wash with your normal soap after using and rinsing off the CHG Soap.  9. Pat yourself dry with a CLEAN TOWEL.   10. Wear CLEAN PAJAMAS   11. Place CLEAN SHEETS on your bed the night of your first shower and DO NOT SLEEP WITH PETS.    Day of Surgery: Do not apply any deodorants/lotions. Please wear clean clothes to the  hospital/surgery center.     Please read over the following fact sheets that you were given. Pain Booklet, Coughing and Deep Breathing, MRSA Information and Surgical Site Infection Prevention
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
## 2017-05-12 ENCOUNTER — Inpatient Hospital Stay (HOSPITAL_COMMUNITY)
Admission: RE | Admit: 2017-05-12 | Discharge: 2017-05-14 | DRG: 472 | Disposition: A | Discharge status: Home / Self Care | Payer: Medicare Other | Source: Ambulatory Visit | Attending: Neurosurgery | Admitting: Neurosurgery

## 2017-05-12 ENCOUNTER — Inpatient Hospital Stay (HOSPITAL_COMMUNITY)
Admission: RE | Disposition: A | Discharge status: Home / Self Care | Payer: Self-pay | Source: Ambulatory Visit | Attending: Neurosurgery

## 2017-05-12 ENCOUNTER — Inpatient Hospital Stay (HOSPITAL_COMMUNITY): Payer: Medicare Other

## 2017-05-12 ENCOUNTER — Ambulatory Visit: Payer: Medicare Other | Admitting: Adult Health

## 2017-05-12 ENCOUNTER — Inpatient Hospital Stay (HOSPITAL_COMMUNITY): Payer: Medicare Other | Admitting: Vascular Surgery

## 2017-05-12 ENCOUNTER — Inpatient Hospital Stay (HOSPITAL_COMMUNITY): Payer: Medicare Other | Admitting: Certified Registered"

## 2017-05-12 ENCOUNTER — Encounter (HOSPITAL_COMMUNITY): Payer: Self-pay | Admitting: *Deleted

## 2017-05-12 DIAGNOSIS — M5011 Cervical disc disorder with radiculopathy,  high cervical region: Secondary | ICD-10-CM | POA: Diagnosis not present

## 2017-05-12 DIAGNOSIS — M5030 Other cervical disc degeneration, unspecified cervical region: Secondary | ICD-10-CM | POA: Diagnosis present

## 2017-05-12 DIAGNOSIS — I10 Essential (primary) hypertension: Secondary | ICD-10-CM | POA: Diagnosis present

## 2017-05-12 DIAGNOSIS — M4322 Fusion of spine, cervical region: Secondary | ICD-10-CM | POA: Diagnosis not present

## 2017-05-12 DIAGNOSIS — Z7982 Long term (current) use of aspirin: Secondary | ICD-10-CM | POA: Diagnosis not present

## 2017-05-12 DIAGNOSIS — M4712 Other spondylosis with myelopathy, cervical region: Secondary | ICD-10-CM | POA: Diagnosis present

## 2017-05-12 DIAGNOSIS — R079 Chest pain, unspecified: Secondary | ICD-10-CM | POA: Diagnosis not present

## 2017-05-12 DIAGNOSIS — M5000 Cervical disc disorder with myelopathy, unspecified cervical region: Secondary | ICD-10-CM | POA: Diagnosis not present

## 2017-05-12 DIAGNOSIS — M4802 Spinal stenosis, cervical region: Secondary | ICD-10-CM | POA: Diagnosis present

## 2017-05-12 DIAGNOSIS — M542 Cervicalgia: Secondary | ICD-10-CM | POA: Diagnosis not present

## 2017-05-12 DIAGNOSIS — Z419 Encounter for procedure for purposes other than remedying health state, unspecified: Secondary | ICD-10-CM

## 2017-05-12 DIAGNOSIS — F0390 Unspecified dementia without behavioral disturbance: Secondary | ICD-10-CM | POA: Diagnosis present

## 2017-05-12 DIAGNOSIS — Z79899 Other long term (current) drug therapy: Secondary | ICD-10-CM | POA: Diagnosis not present

## 2017-05-12 DIAGNOSIS — M47812 Spondylosis without myelopathy or radiculopathy, cervical region: Secondary | ICD-10-CM | POA: Diagnosis not present

## 2017-05-12 HISTORY — PX: POSTERIOR CERVICAL FUSION/FORAMINOTOMY: SHX5038

## 2017-05-12 LAB — POCT I-STAT 7, (LYTES, BLD GAS, ICA,H+H)
Acid-base deficit: 2 mmol/L (ref 0.0–2.0)
Bicarbonate: 22.1 mmol/L (ref 20.0–28.0)
Calcium, Ion: 1.18 mmol/L (ref 1.15–1.40)
HCT: 32 % — ABNORMAL LOW (ref 39.0–52.0)
Hemoglobin: 10.9 g/dL — ABNORMAL LOW (ref 13.0–17.0)
O2 Saturation: 100 %
Patient temperature: 35.3
Potassium: 3.2 mmol/L — ABNORMAL LOW (ref 3.5–5.1)
Sodium: 142 mmol/L (ref 135–145)
TCO2: 23 mmol/L (ref 0–100)
pCO2 arterial: 33.6 mmHg (ref 32.0–48.0)
pH, Arterial: 7.418 (ref 7.350–7.450)
pO2, Arterial: 296 mmHg — ABNORMAL HIGH (ref 83.0–108.0)

## 2017-05-12 LAB — TYPE AND SCREEN
ABO/RH(D): O POS
Antibody Screen: NEGATIVE

## 2017-05-12 SURGERY — POSTERIOR CERVICAL FUSION/FORAMINOTOMY LEVEL 5
Anesthesia: General

## 2017-05-12 MED ORDER — SODIUM CHLORIDE 0.9% FLUSH: 3.0000 mL | INTRAVENOUS | Status: DC | PRN

## 2017-05-12 MED ORDER — BACITRACIN 50000 UNITS IM SOLR
INTRAMUSCULAR | Status: DC | PRN
  Administered 2017-05-12: 09:00:00

## 2017-05-12 MED ORDER — FENTANYL CITRATE (PF) 250 MCG/5ML IJ SOLN
INTRAMUSCULAR | Status: AC
  Filled 2017-05-12: qty 5

## 2017-05-12 MED ORDER — SUGAMMADEX SODIUM 200 MG/2ML IV SOLN
INTRAVENOUS | Status: DC | PRN
  Administered 2017-05-12: 200 mg via INTRAVENOUS

## 2017-05-12 MED ORDER — MEPERIDINE HCL 25 MG/ML IJ SOLN: 6.2500 mg | INTRAMUSCULAR | Status: DC | PRN

## 2017-05-12 MED ORDER — HYDROCODONE-ACETAMINOPHEN 5-325 MG PO TABS
ORAL_TABLET | ORAL | Status: AC
  Filled 2017-05-12: qty 2

## 2017-05-12 MED ORDER — FLEET ENEMA 7-19 GM/118ML RE ENEM: 1.0000 | ENEMA | Freq: Once | RECTAL | Status: DC | PRN

## 2017-05-12 MED ORDER — PHENOL 1.4 % MT LIQD: 1.0000 | OROMUCOSAL | Status: DC | PRN

## 2017-05-12 MED ORDER — DEXAMETHASONE SODIUM PHOSPHATE 10 MG/ML IJ SOLN
INTRAMUSCULAR | Status: DC | PRN
  Administered 2017-05-12: 10 mg via INTRAVENOUS

## 2017-05-12 MED ORDER — THROMBIN 20000 UNITS EX KIT
PACK | CUTANEOUS | Status: DC | PRN
  Administered 2017-05-12: 5000 [IU] via TOPICAL
  Administered 2017-05-12: 20000 [IU] via TOPICAL

## 2017-05-12 MED ORDER — TOPIRAMATE 25 MG PO TABS
25.0000 mg | ORAL_TABLET | Freq: Two times a day (BID) | ORAL | Status: DC
  Administered 2017-05-12 – 2017-05-13 (×3): 25 mg via ORAL
  Filled 2017-05-12 (×4): qty 1

## 2017-05-12 MED ORDER — ONDANSETRON HCL 4 MG/2ML IJ SOLN: 4.0000 mg | Freq: Once | INTRAMUSCULAR | Status: DC | PRN

## 2017-05-12 MED ORDER — KETOROLAC TROMETHAMINE 30 MG/ML IJ SOLN: 15.0000 mg | Freq: Once | INTRAMUSCULAR | Status: DC

## 2017-05-12 MED ORDER — HYDROXYZINE HCL 50 MG/ML IM SOLN: 50.0000 mg | INTRAMUSCULAR | Status: DC | PRN

## 2017-05-12 MED ORDER — SODIUM CHLORIDE 0.9% FLUSH
3.0000 mL | Freq: Two times a day (BID) | INTRAVENOUS | Status: DC
  Administered 2017-05-12 – 2017-05-13 (×3): 3 mL via INTRAVENOUS

## 2017-05-12 MED ORDER — CHLORHEXIDINE GLUCONATE CLOTH 2 % EX PADS: 6.0000 | MEDICATED_PAD | Freq: Once | CUTANEOUS | Status: DC

## 2017-05-12 MED ORDER — BACITRACIN ZINC 500 UNIT/GM EX OINT
TOPICAL_OINTMENT | CUTANEOUS | Status: DC | PRN
  Administered 2017-05-12: 1 via TOPICAL

## 2017-05-12 MED ORDER — POTASSIUM CHLORIDE 10 MEQ/100ML IV SOLN
10.0000 meq | INTRAVENOUS | Status: AC
  Administered 2017-05-12 (×3): 10 meq via INTRAVENOUS
  Filled 2017-05-12: qty 300

## 2017-05-12 MED ORDER — CYCLOBENZAPRINE HCL 5 MG PO TABS
5.0000 mg | ORAL_TABLET | Freq: Three times a day (TID) | ORAL | Status: DC | PRN
  Administered 2017-05-12: 10 mg via ORAL
  Administered 2017-05-12 – 2017-05-14 (×3): 5 mg via ORAL
  Filled 2017-05-12 (×2): qty 1
  Filled 2017-05-12: qty 2
  Filled 2017-05-12: qty 1

## 2017-05-12 MED ORDER — 0.9 % SODIUM CHLORIDE (POUR BTL) OPTIME
TOPICAL | Status: DC | PRN
  Administered 2017-05-12 (×2): 1000 mL

## 2017-05-12 MED ORDER — PHENYLEPHRINE 40 MCG/ML (10ML) SYRINGE FOR IV PUSH (FOR BLOOD PRESSURE SUPPORT)
PREFILLED_SYRINGE | INTRAVENOUS | Status: DC | PRN
  Administered 2017-05-12: 80 ug via INTRAVENOUS

## 2017-05-12 MED ORDER — MIDAZOLAM HCL 2 MG/2ML IJ SOLN
INTRAMUSCULAR | Status: AC
  Filled 2017-05-12: qty 2

## 2017-05-12 MED ORDER — KCL IN DEXTROSE-NACL 40-5-0.45 MEQ/L-%-% IV SOLN
INTRAVENOUS | Status: DC
  Filled 2017-05-12: qty 1000

## 2017-05-12 MED ORDER — HYDROMORPHONE HCL 1 MG/ML IJ SOLN
INTRAMUSCULAR | Status: AC
  Administered 2017-05-12: 0.5 mg via INTRAVENOUS
  Filled 2017-05-12: qty 1

## 2017-05-12 MED ORDER — ACETAMINOPHEN 10 MG/ML IV SOLN
INTRAVENOUS | Status: DC | PRN
  Administered 2017-05-12: 1000 mg via INTRAVENOUS

## 2017-05-12 MED ORDER — AMLODIPINE BESYLATE 10 MG PO TABS
10.0000 mg | ORAL_TABLET | Freq: Every day | ORAL | Status: DC
  Administered 2017-05-13: 10 mg via ORAL
  Filled 2017-05-12 (×2): qty 1

## 2017-05-12 MED ORDER — ONDANSETRON HCL 4 MG/2ML IJ SOLN
INTRAMUSCULAR | Status: DC | PRN
  Administered 2017-05-12: 4 mg via INTRAVENOUS

## 2017-05-12 MED ORDER — KETOROLAC TROMETHAMINE 30 MG/ML IJ SOLN
15.0000 mg | Freq: Four times a day (QID) | INTRAMUSCULAR | Status: DC
Start: 2017-05-12 — End: 2017-05-14
  Administered 2017-05-12 – 2017-05-14 (×7): 15 mg via INTRAVENOUS
  Filled 2017-05-12 (×7): qty 1

## 2017-05-12 MED ORDER — BISACODYL 10 MG RE SUPP: 10.0000 mg | Freq: Every day | RECTAL | Status: DC | PRN

## 2017-05-12 MED ORDER — MAGNESIUM HYDROXIDE 400 MG/5ML PO SUSP: 30.0000 mL | Freq: Every day | ORAL | Status: DC | PRN

## 2017-05-12 MED ORDER — BISOPROLOL FUMARATE 10 MG PO TABS
10.0000 mg | ORAL_TABLET | Freq: Every day | ORAL | Status: DC
  Administered 2017-05-13: 10 mg via ORAL
  Filled 2017-05-12 (×2): qty 1

## 2017-05-12 MED ORDER — BACITRACIN ZINC 500 UNIT/GM EX OINT
TOPICAL_OINTMENT | CUTANEOUS | Status: AC
  Filled 2017-05-12: qty 28.35

## 2017-05-12 MED ORDER — BUPIVACAINE HCL (PF) 0.5 % IJ SOLN
INTRAMUSCULAR | Status: AC
  Filled 2017-05-12: qty 30

## 2017-05-12 MED ORDER — BUPIVACAINE HCL (PF) 0.5 % IJ SOLN
INTRAMUSCULAR | Status: DC | PRN
Start: 2017-05-12 — End: 2017-05-12
  Administered 2017-05-12: 15 mL

## 2017-05-12 MED ORDER — HYDROXYZINE HCL 25 MG PO TABS: 50.0000 mg | ORAL_TABLET | ORAL | Status: DC | PRN

## 2017-05-12 MED ORDER — THROMBIN 20000 UNITS EX SOLR
CUTANEOUS | Status: AC
  Filled 2017-05-12: qty 20000

## 2017-05-12 MED ORDER — ONDANSETRON HCL 4 MG PO TABS: 4.0000 mg | ORAL_TABLET | Freq: Four times a day (QID) | ORAL | Status: DC | PRN

## 2017-05-12 MED ORDER — ONDANSETRON HCL 4 MG/2ML IJ SOLN: 4.0000 mg | Freq: Four times a day (QID) | INTRAMUSCULAR | Status: DC | PRN

## 2017-05-12 MED ORDER — LACTATED RINGERS IV SOLN
INTRAVENOUS | Status: DC | PRN
  Administered 2017-05-12 (×3): via INTRAVENOUS

## 2017-05-12 MED ORDER — ACETAMINOPHEN 10 MG/ML IV SOLN
INTRAVENOUS | Status: AC
  Filled 2017-05-12: qty 100

## 2017-05-12 MED ORDER — LIDOCAINE HCL (CARDIAC) 20 MG/ML IV SOLN
INTRAVENOUS | Status: DC | PRN
  Administered 2017-05-12: 100 mg via INTRAVENOUS

## 2017-05-12 MED ORDER — MORPHINE SULFATE (PF) 4 MG/ML IV SOLN: 4.0000 mg | INTRAVENOUS | Status: DC | PRN

## 2017-05-12 MED ORDER — KETOROLAC TROMETHAMINE 15 MG/ML IJ SOLN
INTRAMUSCULAR | Status: AC
  Administered 2017-05-12: 15 mg
  Filled 2017-05-12: qty 1

## 2017-05-12 MED ORDER — THROMBIN 5000 UNITS EX SOLR
CUTANEOUS | Status: AC
  Filled 2017-05-12: qty 5000

## 2017-05-12 MED ORDER — EPHEDRINE SULFATE 50 MG/ML IJ SOLN
INTRAMUSCULAR | Status: DC | PRN
  Administered 2017-05-12: 5 mg via INTRAVENOUS

## 2017-05-12 MED ORDER — HYDROMORPHONE HCL 1 MG/ML IJ SOLN: 0.2500 mg | INTRAMUSCULAR | Status: DC | PRN

## 2017-05-12 MED ORDER — LIDOCAINE-EPINEPHRINE 2 %-1:100000 IJ SOLN
INTRAMUSCULAR | Status: AC
  Filled 2017-05-12: qty 1

## 2017-05-12 MED ORDER — HYDROCODONE-ACETAMINOPHEN 5-325 MG PO TABS
1.0000 | ORAL_TABLET | ORAL | Status: DC | PRN
  Administered 2017-05-12 – 2017-05-14 (×9): 2 via ORAL
  Filled 2017-05-12 (×8): qty 2

## 2017-05-12 MED ORDER — ALUM & MAG HYDROXIDE-SIMETH 200-200-20 MG/5ML PO SUSP: 30.0000 mL | Freq: Four times a day (QID) | ORAL | Status: DC | PRN

## 2017-05-12 MED ORDER — PHENYLEPHRINE HCL 10 MG/ML IJ SOLN
INTRAMUSCULAR | Status: DC | PRN
  Administered 2017-05-12: 15 ug/min via INTRAVENOUS

## 2017-05-12 MED ORDER — PROPOFOL 10 MG/ML IV BOLUS
INTRAVENOUS | Status: AC
  Filled 2017-05-12: qty 20

## 2017-05-12 MED ORDER — PROPOFOL 10 MG/ML IV BOLUS
INTRAVENOUS | Status: DC | PRN
  Administered 2017-05-12: 100 mg via INTRAVENOUS

## 2017-05-12 MED ORDER — MENTHOL 3 MG MT LOZG: 1.0000 | LOZENGE | OROMUCOSAL | Status: DC | PRN

## 2017-05-12 MED ORDER — ACETAMINOPHEN 325 MG PO TABS: 650.0000 mg | ORAL_TABLET | ORAL | Status: DC | PRN

## 2017-05-12 MED ORDER — HYDROMORPHONE HCL 1 MG/ML IJ SOLN
0.2500 mg | INTRAMUSCULAR | Status: DC | PRN
  Administered 2017-05-12 (×2): 0.5 mg via INTRAVENOUS

## 2017-05-12 MED ORDER — ACETAMINOPHEN 650 MG RE SUPP: 650.0000 mg | RECTAL | Status: DC | PRN

## 2017-05-12 MED ORDER — FENTANYL CITRATE (PF) 100 MCG/2ML IJ SOLN
INTRAMUSCULAR | Status: DC | PRN
  Administered 2017-05-12: 50 ug via INTRAVENOUS
  Administered 2017-05-12: 250 ug via INTRAVENOUS

## 2017-05-12 MED ORDER — ROCURONIUM BROMIDE 100 MG/10ML IV SOLN
INTRAVENOUS | Status: DC | PRN
  Administered 2017-05-12: 50 mg via INTRAVENOUS
  Administered 2017-05-12 (×3): 10 mg via INTRAVENOUS
  Administered 2017-05-12: 20 mg via INTRAVENOUS
  Administered 2017-05-12 (×2): 10 mg via INTRAVENOUS

## 2017-05-12 MED ORDER — LIDOCAINE-EPINEPHRINE 1 %-1:100000 IJ SOLN
INTRAMUSCULAR | Status: DC | PRN
  Administered 2017-05-12: 15 mL via INTRADERMAL

## 2017-05-12 MED ORDER — MIDAZOLAM HCL 5 MG/5ML IJ SOLN
INTRAMUSCULAR | Status: DC | PRN
  Administered 2017-05-12: 2 mg via INTRAVENOUS

## 2017-05-12 SURGICAL SUPPLY — 80 items
ADH SKN CLS APL DERMABOND .7 (GAUZE/BANDAGES/DRESSINGS) ×2
BAG DECANTER FOR FLEXI CONT (MISCELLANEOUS) ×2 IMPLANT
BIT DRILL NEURO 2X3.1 SFT TUCH (MISCELLANEOUS) ×1 IMPLANT
BIT DRILL WIRE PASS 1.3MM (BIT) ×1 IMPLANT
BLADE CLIPPER SURG (BLADE) ×2 IMPLANT
BLADE SURG 11 STRL SS (BLADE) ×2 IMPLANT
BLOCKER OASYS (Neuro Prosthesis/Implant) ×11 IMPLANT
BUR ACRON 5.0MM COATED (BURR) ×1 IMPLANT
CANISTER SUCT 3000ML PPV (MISCELLANEOUS) ×2 IMPLANT
CARTRIDGE OIL MAESTRO DRILL (MISCELLANEOUS) ×1 IMPLANT
COVER BACK TABLE 60X90IN (DRAPES) ×2 IMPLANT
DECANTER SPIKE VIAL GLASS SM (MISCELLANEOUS) ×3 IMPLANT
DERMABOND ADVANCED (GAUZE/BANDAGES/DRESSINGS) ×2
DERMABOND ADVANCED .7 DNX12 (GAUZE/BANDAGES/DRESSINGS) ×1 IMPLANT
DIFFUSER DRILL AIR PNEUMATIC (MISCELLANEOUS) ×2 IMPLANT
DRAPE C-ARM 42X72 X-RAY (DRAPES) ×5 IMPLANT
DRAPE HALF SHEET 40X57 (DRAPES) IMPLANT
DRAPE LAPAROTOMY 100X72 PEDS (DRAPES) ×2 IMPLANT
DRAPE POUCH INSTRU U-SHP 10X18 (DRAPES) ×2 IMPLANT
DRILL NEURO 2X3.1 SOFT TOUCH (MISCELLANEOUS) ×2
DRILL OASYS 2.5MM (BIT) IMPLANT
DRILL WIRE PASS 1.3MM (BIT) ×2
DRIUS OASYS 2.5MM (BIT) ×4
ELECT CAUTERY BLADE 6.4 (BLADE) ×1 IMPLANT
ELECT REM PT RETURN 9FT ADLT (ELECTROSURGICAL) ×2
ELECTRODE REM PT RTRN 9FT ADLT (ELECTROSURGICAL) ×1 IMPLANT
EVACUATOR 1/8 PVC DRAIN (DRAIN) IMPLANT
GAUZE SPONGE 4X4 12PLY STRL (GAUZE/BANDAGES/DRESSINGS) IMPLANT
GAUZE SPONGE 4X4 16PLY XRAY LF (GAUZE/BANDAGES/DRESSINGS) IMPLANT
GLOVE BIO SURGEON STRL SZ8 (GLOVE) ×1 IMPLANT
GLOVE BIOGEL PI IND STRL 8 (GLOVE) ×1 IMPLANT
GLOVE BIOGEL PI INDICATOR 8 (GLOVE) ×2
GLOVE ECLIPSE 7.5 STRL STRAW (GLOVE) ×3 IMPLANT
GLOVE EXAM NITRILE LRG STRL (GLOVE) IMPLANT
GLOVE EXAM NITRILE XL STR (GLOVE) IMPLANT
GLOVE EXAM NITRILE XS STR PU (GLOVE) IMPLANT
GLOVE INDICATOR 7.5 STRL GRN (GLOVE) ×1 IMPLANT
GLOVE INDICATOR 8.5 STRL (GLOVE) ×1 IMPLANT
GLOVE SS BIOGEL STRL SZ 7.5 (GLOVE) IMPLANT
GLOVE SUPERSENSE BIOGEL SZ 7.5 (GLOVE) ×1
GOWN STRL REUS W/ TWL LRG LVL3 (GOWN DISPOSABLE) IMPLANT
GOWN STRL REUS W/ TWL XL LVL3 (GOWN DISPOSABLE) ×1 IMPLANT
GOWN STRL REUS W/TWL 2XL LVL3 (GOWN DISPOSABLE) IMPLANT
GOWN STRL REUS W/TWL LRG LVL3 (GOWN DISPOSABLE) ×6
GOWN STRL REUS W/TWL XL LVL3 (GOWN DISPOSABLE) ×4
HEMOSTAT POWDER KIT SURGIFOAM (HEMOSTASIS) ×1 IMPLANT
HEMOSTAT SURGICEL 2X14 (HEMOSTASIS) IMPLANT
KIT BASIN OR (CUSTOM PROCEDURE TRAY) ×2 IMPLANT
KIT INFUSE X SMALL 1.4CC (Orthopedic Implant) ×1 IMPLANT
KIT ROOM TURNOVER OR (KITS) ×2 IMPLANT
NDL SPNL 18GX3.5 QUINCKE PK (NEEDLE) ×1 IMPLANT
NDL SPNL 22GX3.5 QUINCKE BK (NEEDLE) ×2 IMPLANT
NEEDLE SPNL 18GX3.5 QUINCKE PK (NEEDLE) ×2 IMPLANT
NEEDLE SPNL 22GX3.5 QUINCKE BK (NEEDLE) ×2 IMPLANT
NS IRRIG 1000ML POUR BTL (IV SOLUTION) ×2 IMPLANT
OIL CARTRIDGE MAESTRO DRILL (MISCELLANEOUS) ×2
PACK LAMINECTOMY NEURO (CUSTOM PROCEDURE TRAY) ×2 IMPLANT
PAD ARMBOARD 7.5X6 YLW CONV (MISCELLANEOUS) ×6 IMPLANT
PATTIES SURGICAL 1X1 (DISPOSABLE) ×1 IMPLANT
PIN MAYFIELD SKULL DISP (PIN) ×2 IMPLANT
ROD OASYS 3.5X120MM (Rod) IMPLANT
ROD OASYS 3.5X80MM (Orthopedic Implant) ×2 IMPLANT
SCREW BIASED ANGLE 3.5X14 (Screw) ×10 IMPLANT
SCREW BIASED ANGLE 3.5X16 (Screw) ×4 IMPLANT
SCREW BIASED ANGLE 3.5X24 (Screw) ×1 IMPLANT
SPONGE LAP 4X18 X RAY DECT (DISPOSABLE) IMPLANT
SPONGE SURGIFOAM ABS GEL 100 (HEMOSTASIS) ×2 IMPLANT
STAPLER SKIN PROX WIDE 3.9 (STAPLE) ×2 IMPLANT
STRIP BIOACTIVE VITOSS 25X52X4 (Orthopedic Implant) ×1 IMPLANT
SUT ETHILON 3 0 FSL (SUTURE) ×1 IMPLANT
SUT VIC AB 0 CT1 18XCR BRD8 (SUTURE) ×1 IMPLANT
SUT VIC AB 0 CT1 8-18 (SUTURE) ×6
SUT VIC AB 2-0 CP2 18 (SUTURE) ×3 IMPLANT
SUT VIC AB 3-0 SH 8-18 (SUTURE) ×1 IMPLANT
TAP 3.5MM (TAP) ×1 IMPLANT
TOWEL GREEN STERILE (TOWEL DISPOSABLE) ×2 IMPLANT
TOWEL GREEN STERILE FF (TOWEL DISPOSABLE) ×2 IMPLANT
TRAY FOLEY W/METER SILVER 16FR (SET/KITS/TRAYS/PACK) IMPLANT
UNDERPAD 30X30 (UNDERPADS AND DIAPERS) ×2 IMPLANT
WATER STERILE IRR 1000ML POUR (IV SOLUTION) ×2 IMPLANT

## 2017-05-12 NOTE — Anesthesia Procedure Notes (Addendum)
Arterial Line Insertion Performed by: Dorie Rank, CRNA  Preanesthetic checklist: patient identified, IV checked, site marked, risks and benefits discussed, surgical consent, monitors and equipment checked, pre-op evaluation and timeout performed Left, radial was placed Catheter size: 20 G  Attempts: 1 Procedure performed without using ultrasound guided technique. Ultrasound Notes:anatomy identified Following insertion, Biopatch and dressing applied. Post procedure assessment: normal  Patient tolerated the procedure well with no immediate complications.
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
## 2017-05-12 NOTE — Anesthesia Preprocedure Evaluation (Addendum)
Anesthesia Evaluation  Patient identified by MRN, date of birth, ID band Patient awake    Reviewed: Allergy & Precautions, NPO status , Patient's Chart, lab work & pertinent test results  Airway Mallampati: I  TM Distance: >3 FB Neck ROM: Full    Dental   Pulmonary    Pulmonary exam normal        Cardiovascular hypertension, Pt. on medications Normal cardiovascular exam     Neuro/Psych    GI/Hepatic   Endo/Other    Renal/GU      Musculoskeletal   Abdominal   Peds  Hematology   Anesthesia Other Findings   Reproductive/Obstetrics                            Anesthesia Physical Anesthesia Plan  ASA: III  Anesthesia Plan: General   Post-op Pain Management:    Induction: Intravenous  PONV Risk Score and Plan: 2 and Ondansetron, Dexamethasone and Treatment may vary due to age or medical condition  Airway Management Planned: Oral ETT  Additional Equipment:   Intra-op Plan:   Post-operative Plan: Extubation in OR  Informed Consent: I have reviewed the patients History and Physical, chart, labs and discussed the procedure including the risks, benefits and alternatives for the proposed anesthesia with the patient or authorized representative who has indicated his/her understanding and acceptance.     Plan Discussed with: CRNA and Surgeon  Anesthesia Plan Comments:         Anesthesia Quick Evaluation

## 2017-05-12 NOTE — Transfer of Care (Signed)
Immediate Anesthesia Transfer of Care Note  Patient: Thomas [name]  Procedure(s) Performed: Procedure(s) with comments: Posterior Cervical Two through Cervical Six Cervical Laminectomies Cervical Two through Cervical Seven Posterior cervical arthrodesis (N/A) - Posterior Cervical Two through Cervical Six Cervical Laminectomies Cervical Two through Cervical Seven Posterior cervical arthrodesis  Patient Location: PACU  Anesthesia Type:General  Level of Consciousness: awake, alert  and oriented  Airway & Oxygen Therapy: Patient Spontanous Breathing and Patient connected to nasal cannula oxygen  Post-op Assessment: Report given to RN, Post -op Vital signs reviewed and stable and Patient moving all extremities X 4  Post vital signs: Reviewed and stable  Last Vitals:  Vitals:   05/12/17 0550  BP: (!) 159/103  Pulse: 72  Resp: 20  Temp: 37.2 C  SpO2: 100%    Last Pain:  Vitals:   05/12/17 0550  TempSrc: Oral         Complications: No apparent anesthesia complications

## 2017-05-12 NOTE — Anesthesia Postprocedure Evaluation (Signed)
Anesthesia Post Note  Patient: Thomas [name]  Procedure(s) Performed: Procedure(s) (LRB): Posterior Cervical Two through Cervical Six Cervical Laminectomies Cervical Two through Cervical Seven Posterior cervical arthrodesis (N/A)     Patient location during evaluation: PACU Anesthesia Type: General Level of consciousness: awake and alert Pain management: pain level controlled Vital Signs Assessment: post-procedure vital signs reviewed and stable Respiratory status: spontaneous breathing, nonlabored ventilation, respiratory function stable and patient connected to nasal cannula oxygen Cardiovascular status: blood pressure returned to baseline and stable Postop Assessment: no signs of nausea or vomiting Anesthetic complications: no    Last Vitals:  Vitals:   05/12/17 1345 05/12/17 1400  BP: 118/77 116/81  Pulse: 70 72  Resp: 19 20  Temp:    SpO2: 100% 97%    Last Pain:  Vitals:   05/12/17 1345  TempSrc:   PainSc: 2                  Thomas [name]

## 2017-05-12 NOTE — Progress Notes (Signed)
Vitals:   05/12/17 1330 05/12/17 1345 05/12/17 1400 05/12/17 1531  BP: 118/77 118/77 116/81 121/83  Pulse: 70 70 72 68  Resp: (!) 22 19 20 20   Temp:    97.7 F (36.5 C)  TempSrc:      SpO2: 100% 100% 97% 98%    CBC  Recent Labs  05/11/17 1022 05/12/17 0916  WBC 7.6  --   HGB 13.3 10.9*  HCT 38.5* 32.0*  PLT 302  --    BMET  Recent Labs  05/11/17 1022 05/12/17 0916  NA 140 142  K 2.8* 3.2*  CL 106  --   CO2 26  --   GLUCOSE 148*  --   BUN 5*  --   CREATININE 1.10  --   CALCIUM 9.5  --     Patient sitting up in chair, has ambulated in the hallways. Dressing clean and dry. Foley to straight drainage. We'll plan on D seeing in early morning, and having nursing staff monitor voiding function.  Plan: Encouraged to ambulate again this evening in the halls. We'll continue to progress through postoperative recovery.  Hewitt Shorts, MD 05/12/2017, 7:50 PM

## 2017-05-12 NOTE — Op Note (Signed)
05/12/2017  12:36 PM  PATIENT:  Thomas [name]  66 y.o. male  PRE-OPERATIVE DIAGNOSIS:  Cervical spinal stenosis, cervical spondylosis, cervical degenerative disc disease  POST-OPERATIVE DIAGNOSIS:  Cervical spinal stenosis, cervical spondylosis, cervical degenerative disc disease  PROCEDURE:  Procedure(s):  Posterior Cervical Two through Cervical Six Cervical Laminectomy, Cervical Two through Cervical Seven Posterior cervical arthrodesis with Oasys posterior instrumentation, locally harvested morcellized autograft, Vitoss BA, and infuse  SURGEON:  Shirlean Kelly, M.D.  ASSISTANTS: Lisbeth Renshaw, M.D., Donalee Citrin, M.D.  ANESTHESIA:   general  EBL:  Total I/O In: 2000 [I.V.:2000] Out: 560 [Urine:410; Blood:150]  BLOOD ADMINISTERED:none  COUNT: Correct per nursing staff  DICTATION: Patient was brought to the operating room, placed under general endotracheal anesthesia.  Radiolucent 3 pin Mayfield head holderwas applied, and the patient was turned to a prone position. The posterior neck, occiput, and upper back were prepped with Betadine soap and solution and draped in a sterile fashion. The midline was infiltrated with local anesthetic with epinephrine.  The C-arm fluoroscope was used throughout the procedure to guide localization and screw placement.  A midline incision was made over the C2-C7 levels, and carried down through the subcutaneous tissue. Bipolar cautery and electrocautery used to maintain hemostasis.  Posterior cervical fascia was incised bilaterally, and the paracervical musculature was dissected from the spinous processes and lamina in a subperiosteal fashion. Self-retaining retractor was placed. Using the C-arm fluoroscope we identified the spinous processes and lamina of C2-C7. Dissection was carried out laterally exposing the lateral masses of C2, C3, C4, C5, C6, and C7. We first placed the lateral mass screws from C3-C7. Pilot holes were made using the high-speed drill  with the wire passer bur. Each screw hole was then drilled using the hand drill and a drill guide set to a depth of 14 mm. Each screw hole was examined with the ball probe. Good bony surfaces were noted. C-arm fluoroscopy confirmed good screw hole positioning, and the posterior cortex of each screw hole was tapped, and a  3.5 x 14 mm mm screw placed. We then placed a laminar screws at C2. A pilot hole was made, the screw hole was hand drilled, the posterior cortex tapped, and the screw placed. We then proceeded with the decompressive cervical laminectomy. An inferior C2 and complete C3, C4, C5, and C6 laminectomy was performed using double-action rongeurs, the high-speed drill, and Kerrison punches with thin footplates. Laminectomy was extended laterally at each level, and the thickened ligament of flavum was carefully removed. Good decompression of the spinal canal and thecal sac was achieved from C2 through the superior aspect of C6. C2 was undercut. Unfortunately the laminar screws were exposed during the C2 laminectomy, and therefore they had to be removed and we therefore elected to transition to bilateral C2 pars screws. The medial border of the pedicle of C2 was identified, and entry points were identified bilaterally. Pilot holes were made with the high-speed drill with the wire passer bur. With C-arm fluoroscopic guidance and continued monitoring of the medial aspect of the C2 pedicle we drilled into the C2 pars to a depth of 16 mm, the posterior cortex was tapped, and placed 3.5 x 14 mm crews bilaterally. We then contoured 80 mm rods to a lordotic shape to be used on each side. They were placed within the screw heads on each side, and locking caps were placed. Unfortunately we were not able to position the left C5 screw head to be able to secure locking. We  did have to change out the right C3 lateral mass screw to a 3.5 x 16 mm screw, which was left slightly proud, so that it would engage the rod. Once all  11 locking caps were placed, final tightening was performed against a counter torque. We then packed a combination of locally harvested morcellized autograft, Vitoss BA, and infuse over the decorticated lateral masses. Surgifoam was placed along the margins of the laminectomy, and good hemostasis was established and confirmed. The wound had been irrigated numerous times to the procedure with saline solution and subsequently bacitracin solution. He was states was confirmed we then proceeded with closure. The paraspinal musculature was closed with interrupted undyed 0 Vicryl sutures. Deep fascia was closed with interrupted undyed 0 Vicryl sutures. Scarpa's fascia was closed with interrupted undyed 0 Vicryl sutures. The subcutaneous and subcuticular closed with interrupted inverted 2-0 and 3-0 undyed Vicryl sutures.  Skin edges were closed with Dermabond. The wound was dressed with sterile gauze and Hypafix.  Following surgery the patient was turned back to a supine position, the 3 pin Mayfield head holder was removed, and the patient is to be reversed and the anesthetic, extubated, and transferred to the recovery room for further care.  PLAN OF CARE: Admit to inpatient   PATIENT DISPOSITION:  PACU - hemodynamically stable.   Delay start of Pharmacological VTE agent (>24hrs) due to surgical blood loss or risk of bleeding:  yes

## 2017-05-12 NOTE — Anesthesia Procedure Notes (Signed)
Procedure Name: Intubation Date/Time: 05/12/2017 7:43 AM Performed by: Lanell Matar Pre-anesthesia Checklist: Patient identified, Emergency Drugs available, Suction available and Patient being monitored Patient Re-evaluated:Patient Re-evaluated prior to induction Oxygen Delivery Method: Circle System Utilized Preoxygenation: Pre-oxygenation with 100% oxygen Induction Type: IV induction Ventilation: Mask ventilation without difficulty Laryngoscope Size: Miller and 2 Grade View: Grade II Tube type: Oral Tube size: 7.5 mm Number of attempts: 1 Airway Equipment and Method: Stylet and Oral airway Placement Confirmation: ETT inserted through vocal cords under direct vision,  positive ETCO2 and breath sounds checked- equal and bilateral Secured at: 22 cm Tube secured with: Tape Dental Injury: Teeth and Oropharynx as per pre-operative assessment

## 2017-05-13 ENCOUNTER — Encounter (HOSPITAL_COMMUNITY): Payer: Self-pay | Admitting: Neurosurgery

## 2017-05-13 MED FILL — Thrombin For Soln 20000 Unit: CUTANEOUS | Qty: 1 | Status: AC

## 2017-05-13 MED FILL — Thrombin For Soln 5000 Unit: CUTANEOUS | Qty: 5000 | Status: AC

## 2017-05-13 NOTE — Progress Notes (Signed)
Vitals:   05/12/17 2000 05/12/17 2332 05/13/17 0400 05/13/17 0737  BP: 119/82 115/80 112/78 100/73  Pulse: 68 80 67 68  Resp: 20 18 18 18   Temp: 97.9 F (36.6 C) 98.6 F (37 C) 98.4 F (36.9 C) 98.4 F (36.9 C)  TempSrc: Oral Oral Oral Oral  SpO2: 98% 98% 100% 97%    CBC  Recent Labs  05/11/17 1022 05/12/17 0916  WBC 7.6  --   HGB 13.3 10.9*  HCT 38.5* 32.0*  PLT 302  --    BMET  Recent Labs  05/11/17 1022 05/12/17 0916  NA 140 142  K 2.8* 3.2*  CL 106  --   CO2 26  --   GLUCOSE 148*  --   BUN 5*  --   CREATININE 1.10  --   CALCIUM 9.5  --     Patient resting in bed, has been up and ambulating in the halls. Foley DC'd at 0400. No void yet. Dressing clean and dry.  Plan: Encouraged to ambulate in the halls. Continue to progress through postoperative recovery.  Hewitt Shorts, MD 05/13/2017, 7:45 AM

## 2017-05-14 MED ORDER — HYDROCODONE-ACETAMINOPHEN 5-325 MG PO TABS: 1.0000 | ORAL_TABLET | ORAL | 0 refills | Status: DC | PRN

## 2017-05-14 NOTE — Progress Notes (Signed)
Patient alert and oriented, mae's well, voiding adequate amount of urine, swallowing without difficulty, no c/o pain at time of discharge. Patient discharged home with family. Script and discharged instructions given to patient. Patient and family stated understanding of instructions given. Patient has an appointment with Dr. Nudelman 

## 2017-05-14 NOTE — Care Management Note (Signed)
Case Management Note  Patient Details  Name: Thomas [name] MRN: [id number] Date of Birth: 1951-05-07  Subjective/Objective:                 Patient with order to DC to home today. Chart reviewed. No Home Health or Equipment needs, no unacknowledged Case Management consults or medication needs identified at the time of this note. Plan for DC to home. If needs arise today prior to discharge, please call Lawerance Sabal RN CM at [phone].    Action/Plan:   Expected Discharge Date:  05/14/17               Expected Discharge Plan:  Home/Self Care  In-House Referral:     Discharge planning Services  CM Consult  Post Acute Care Choice:    Choice offered to:     DME Arranged:    DME Agency:     HH Arranged:    HH Agency:     Status of Service:  Completed, signed off  If discussed at [employer] of Stay Meetings, dates discussed:    Additional Comments:  Lawerance Sabal, RN 05/14/2017, 8:00 AM

## 2017-05-14 NOTE — Discharge Summary (Signed)
Physician Discharge Summary  Patient ID: Thomas [name] MRN: [id number] DOB/AGE: [birth year] 68 y.o.  Admit date: 05/12/2017 Discharge date: 05/14/2017  Admission Diagnoses:   Cervical spinal stenosis, cervical spondylosis, cervical degenerative disc disease  Discharge Diagnoses:   Cervical spinal stenosis, cervical spondylosis, cervical degenerative disc disease Active Problems:   Cervical stenosis of spinal canal   Discharged Condition: good  Hospital Course: Patient was admitted, underwent a C2-C6 cervical laminectomy, and a C2-C7 posterior cervical arthrodesis. Postoperatively he has done well. He is up and ambulating actively. His incision is healing nicely. It is clean and dry. There is no erythema, swelling, or drainage. He is being discharged home with instructions regarding wound care and activities. He is scheduled to follow-up with me in a couple of weeks.  Discharge Exam: Blood pressure 116/87, pulse 70, temperature 98.3 F (36.8 C), temperature source Oral, resp. rate 18, SpO2 99 %.  Disposition:  Home  Discharge Instructions    Discharge wound care:    Complete by:  As directed    Leave the wound open to air. Shower daily with the wound uncovered. Water and soapy water should run over the incision area. Do not wash directly on the incision for 2 weeks. Remove the glue after 2 weeks.   Driving Restrictions    Complete by:  As directed    No driving for 2 weeks. May ride in the car locally now. May begin to drive locally in 2 weeks.   Other Restrictions    Complete by:  As directed    Walk gradually increasing distances out in the fresh air at least twice a day. Walking additional 6 times inside the house, gradually increasing distances, daily. No bending, lifting, or twisting. Perform activities between shoulder and waist height (that is at counter height when standing or table height when sitting).     Allergies as of 05/14/2017   No Known Allergies     Medication  List    TAKE these medications   amLODipine 10 MG tablet Commonly known as:  NORVASC Take 1 tablet (10 mg total) by mouth daily.   aspirin 81 MG tablet Take 81 mg by mouth daily.   bisoprolol 10 MG tablet Commonly known as:  ZEBETA Take 1 tablet (10 mg total) by mouth daily.   cetirizine 10 MG tablet Commonly known as:  ZYRTEC Take 10 mg by mouth daily as needed for allergies.   HYDROcodone-acetaminophen 5-325 MG tablet Commonly known as:  NORCO/VICODIN Take 1-2 tablets by mouth every 4 (four) hours as needed (pain).   topiramate 25 MG tablet Commonly known as:  TOPAMAX Take 1 tablet (25 mg total) by mouth 2 (two) times daily.            Discharge Care Instructions        Start     Ordered   05/14/17 0000  HYDROcodone-acetaminophen (NORCO/VICODIN) 5-325 MG tablet  Every 4 hours PRN     05/14/17 0756   05/14/17 0000  Driving Restrictions    Comments:  No driving for 2 weeks. May ride in the car locally now. May begin to drive locally in 2 weeks.   05/14/17 0756   05/14/17 0000  Other Restrictions    Comments:  Walk gradually increasing distances out in the fresh air at least twice a day. Walking additional 6 times inside the house, gradually increasing distances, daily. No bending, lifting, or twisting. Perform activities between shoulder and waist height (that is at counter height when  standing or table height when sitting).   05/14/17 0756   05/14/17 0000  Discharge wound care:    Comments:  Leave the wound open to air. Shower daily with the wound uncovered. Water and soapy water should run over the incision area. Do not wash directly on the incision for 2 weeks. Remove the glue after 2 weeks.   05/14/17 0756       Signed: Hewitt Shorts, MD 05/14/2017, 7:56 AM

## 2017-05-14 NOTE — Discharge Instructions (Signed)

## 2017-05-18 ENCOUNTER — Ambulatory Visit: Payer: Medicare Other | Admitting: Neurology

## 2017-06-02 DIAGNOSIS — Z981 Arthrodesis status: Secondary | ICD-10-CM | POA: Diagnosis not present

## 2017-06-02 DIAGNOSIS — M47812 Spondylosis without myelopathy or radiculopathy, cervical region: Secondary | ICD-10-CM | POA: Diagnosis not present

## 2017-06-02 DIAGNOSIS — M4802 Spinal stenosis, cervical region: Secondary | ICD-10-CM | POA: Diagnosis not present

## 2017-06-02 DIAGNOSIS — M5030 Other cervical disc degeneration, unspecified cervical region: Secondary | ICD-10-CM | POA: Diagnosis not present
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
## 2017-06-06 ENCOUNTER — Encounter: Payer: Self-pay | Admitting: Adult Health

## 2017-06-06 ENCOUNTER — Ambulatory Visit (INDEPENDENT_AMBULATORY_CARE_PROVIDER_SITE_OTHER): Payer: Medicare Other | Admitting: Adult Health

## 2017-06-06 VITALS — BP 138/88 | HR 63 | Ht 68.0 in | Wt 183.0 lb

## 2017-06-06 DIAGNOSIS — I10 Essential (primary) hypertension: Secondary | ICD-10-CM | POA: Diagnosis not present

## 2017-06-06 MED ORDER — AMLODIPINE BESYLATE 10 MG PO TABS: 10.0000 mg | ORAL_TABLET | Freq: Every day | ORAL | 3 refills | Status: DC

## 2017-06-06 MED ORDER — BISOPROLOL FUMARATE 10 MG PO TABS: 10.0000 mg | ORAL_TABLET | Freq: Every day | ORAL | 3 refills | Status: DC

## 2017-06-06 NOTE — Patient Instructions (Signed)
Medication Instructions:  Your physician recommends that you continue on your current medications as directed. Please refer to the Current Medication list given to you today.   Labwork: NONE   Testing/Procedures: NONE  Follow-Up: Your physician wants you to follow-up in: 6 Months with Dr. Branch. You will receive a reminder letter in the mail two months in advance. If you don't receive a letter, please call our office to schedule the follow-up appointment.   Any Other Special Instructions Will Be Listed Below (If Applicable).     If you need a refill on your cardiac medications before your next appointment, please call your pharmacy. Thank you for choosing [facility]!    

## 2017-07-23 ENCOUNTER — Other Ambulatory Visit: Payer: Self-pay | Admitting: Neurology

## 2017-07-23 DIAGNOSIS — G44209 Tension-type headache, unspecified, not intractable: Secondary | ICD-10-CM

## 2017-07-25 ENCOUNTER — Other Ambulatory Visit: Payer: Self-pay

## 2017-07-25 ENCOUNTER — Telehealth: Payer: Self-pay | Admitting: Neurology

## 2017-07-25 DIAGNOSIS — G44209 Tension-type headache, unspecified, not intractable: Secondary | ICD-10-CM

## 2017-07-25 MED ORDER — TOPIRAMATE 25 MG PO TABS: 25.0000 mg | ORAL_TABLET | Freq: Two times a day (BID) | ORAL | 0 refills | Status: DC

## 2017-07-25 NOTE — Telephone Encounter (Signed)
[facility] re: their new program Save a Trip Refills,having to do with medication alignment of pt's topiramate (TOPAMAX) 25 MG tablet she is asking for a new prescription, please call Carollee HerterShannon [phone] (Phone)

## 2017-07-25 NOTE — Telephone Encounter (Signed)
Refill done for 3 months.Pt has appt this month with NP.
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
## 2017-07-27 ENCOUNTER — Ambulatory Visit (INDEPENDENT_AMBULATORY_CARE_PROVIDER_SITE_OTHER): Payer: Medicare Other | Admitting: Nurse Practitioner

## 2017-07-27 ENCOUNTER — Encounter: Payer: Self-pay | Admitting: Nurse Practitioner

## 2017-07-27 VITALS — BP 135/82 | HR 59 | Wt 189.2 lb

## 2017-07-27 DIAGNOSIS — M4802 Spinal stenosis, cervical region: Secondary | ICD-10-CM

## 2017-07-27 DIAGNOSIS — F0390 Unspecified dementia without behavioral disturbance: Secondary | ICD-10-CM

## 2017-07-27 MED ORDER — DONEPEZIL HCL 5 MG PO TABS: 5.0000 mg | ORAL_TABLET | Freq: Every day | ORAL | 3 refills | Status: DC

## 2017-07-27 NOTE — Progress Notes (Signed)
I agree with the above plan 

## 2017-07-27 NOTE — Patient Instructions (Signed)
Patient has stopped Topamax no further headaches since cervical laminectomy Restart Aricept at 5 mg daily Follow-up in 3 months

## 2017-08-03 DIAGNOSIS — Z1389 Encounter for screening for other disorder: Secondary | ICD-10-CM | POA: Diagnosis not present

## 2017-08-03 DIAGNOSIS — G4700 Insomnia, unspecified: Secondary | ICD-10-CM | POA: Diagnosis not present

## 2017-08-03 DIAGNOSIS — E663 Overweight: Secondary | ICD-10-CM | POA: Diagnosis not present

## 2017-08-03 DIAGNOSIS — R110 Nausea: Secondary | ICD-10-CM | POA: Diagnosis not present

## 2017-08-03 DIAGNOSIS — Z6827 Body mass index (BMI) 27.0-27.9, adult: Secondary | ICD-10-CM | POA: Diagnosis not present

## 2017-08-03 DIAGNOSIS — R42 Dizziness and giddiness: Secondary | ICD-10-CM | POA: Diagnosis not present

## 2017-08-06 ENCOUNTER — Observation Stay (HOSPITAL_COMMUNITY)
Admission: EM | Admit: 2017-08-06 | Discharge: 2017-08-08 | Disposition: A | Discharge status: Home / Self Care | Payer: Medicare Other | Attending: Internal Medicine | Admitting: Internal Medicine

## 2017-08-06 ENCOUNTER — Other Ambulatory Visit: Payer: Self-pay

## 2017-08-06 ENCOUNTER — Encounter (HOSPITAL_COMMUNITY): Payer: Self-pay | Admitting: Emergency Medicine

## 2017-08-06 ENCOUNTER — Emergency Department (HOSPITAL_COMMUNITY): Payer: Medicare Other

## 2017-08-06 DIAGNOSIS — R197 Diarrhea, unspecified: Secondary | ICD-10-CM

## 2017-08-06 DIAGNOSIS — I952 Hypotension due to drugs: Secondary | ICD-10-CM

## 2017-08-06 DIAGNOSIS — R2689 Other abnormalities of gait and mobility: Secondary | ICD-10-CM | POA: Diagnosis not present

## 2017-08-06 DIAGNOSIS — N179 Acute kidney failure, unspecified: Secondary | ICD-10-CM | POA: Diagnosis present

## 2017-08-06 DIAGNOSIS — Z7982 Long term (current) use of aspirin: Secondary | ICD-10-CM | POA: Insufficient documentation

## 2017-08-06 DIAGNOSIS — R2681 Unsteadiness on feet: Secondary | ICD-10-CM | POA: Insufficient documentation

## 2017-08-06 DIAGNOSIS — Z79899 Other long term (current) drug therapy: Secondary | ICD-10-CM | POA: Insufficient documentation

## 2017-08-06 DIAGNOSIS — I10 Essential (primary) hypertension: Secondary | ICD-10-CM | POA: Diagnosis present

## 2017-08-06 DIAGNOSIS — F0390 Unspecified dementia without behavioral disturbance: Secondary | ICD-10-CM | POA: Diagnosis present

## 2017-08-06 DIAGNOSIS — R531 Weakness: Secondary | ICD-10-CM | POA: Diagnosis not present

## 2017-08-06 DIAGNOSIS — E876 Hypokalemia: Principal | ICD-10-CM | POA: Diagnosis present

## 2017-08-06 LAB — COMPREHENSIVE METABOLIC PANEL
ALT: 17 U/L (ref 17–63)
ANION GAP: 11 (ref 5–15)
AST: 29 U/L (ref 15–41)
Albumin: 4.5 g/dL (ref 3.5–5.0)
Alkaline Phosphatase: 123 U/L (ref 38–126)
BILIRUBIN TOTAL: 0.7 mg/dL (ref 0.3–1.2)
BUN: 19 mg/dL (ref 6–20)
CHLORIDE: 95 mmol/L — AB (ref 101–111)
CO2: 32 mmol/L (ref 22–32)
Calcium: 9.6 mg/dL (ref 8.9–10.3)
Creatinine, Ser: 1.48 mg/dL — ABNORMAL HIGH (ref 0.61–1.24)
GFR, EST AFRICAN AMERICAN: 54 mL/min — AB (ref >60)
GFR, EST NON AFRICAN AMERICAN: 47 mL/min — AB (ref >60)
Glucose, Bld: 123 mg/dL — ABNORMAL HIGH (ref 65–99)
POTASSIUM: 2.1 mmol/L — AB (ref 3.5–5.1)
Sodium: 138 mmol/L (ref 135–145)
TOTAL PROTEIN: 8.8 g/dL — AB (ref 6.5–8.1)

## 2017-08-06 LAB — CBC WITH DIFFERENTIAL/PLATELET
BASOS ABS: 0.1 10*3/uL (ref 0.0–0.1)
BASOS PCT: 1 %
Eosinophils Absolute: 0.2 10*3/uL (ref 0.0–0.7)
Eosinophils Relative: 2 %
HEMATOCRIT: 43.7 % (ref 39.0–52.0)
Hemoglobin: 14.9 g/dL (ref 13.0–17.0)
LYMPHS PCT: 27 %
Lymphs Abs: 2.7 10*3/uL (ref 0.7–4.0)
MCH: 31.5 pg (ref 26.0–34.0)
MCHC: 34.1 g/dL (ref 30.0–36.0)
MCV: 92.4 fL (ref 78.0–100.0)
Monocytes Absolute: 0.6 10*3/uL (ref 0.1–1.0)
Monocytes Relative: 6 %
NEUTROS ABS: 6.4 10*3/uL (ref 1.7–7.7)
Neutrophils Relative %: 64 %
PLATELETS: 359 10*3/uL (ref 150–400)
RBC: 4.73 MIL/uL (ref 4.22–5.81)
RDW: 13.1 % (ref 11.5–15.5)
WBC: 10 10*3/uL (ref 4.0–10.5)

## 2017-08-06 LAB — MAGNESIUM: Magnesium: 2.1 mg/dL (ref 1.7–2.4)

## 2017-08-06 LAB — TSH: TSH: 0.893 u[IU]/mL (ref 0.350–4.500)

## 2017-08-06 LAB — TROPONIN I: Troponin I: 0.03 ng/mL (ref <0.03)

## 2017-08-06 LAB — LIPASE, BLOOD: LIPASE: 29 U/L (ref 11–51)

## 2017-08-06 MED ORDER — POTASSIUM CHLORIDE CRYS ER 20 MEQ PO TBCR
40.0000 meq | EXTENDED_RELEASE_TABLET | Freq: Once | ORAL | Status: AC
  Administered 2017-08-06: 40 meq via ORAL
  Filled 2017-08-06: qty 2

## 2017-08-06 MED ORDER — ONDANSETRON HCL 4 MG/2ML IJ SOLN: 4.0000 mg | Freq: Four times a day (QID) | INTRAMUSCULAR | Status: DC | PRN

## 2017-08-06 MED ORDER — ONDANSETRON HCL 4 MG PO TABS
4.0000 mg | ORAL_TABLET | Freq: Four times a day (QID) | ORAL | Status: DC | PRN
  Administered 2017-08-07: 4 mg via ORAL
  Filled 2017-08-06: qty 1

## 2017-08-06 MED ORDER — POTASSIUM CHLORIDE CRYS ER 20 MEQ PO TBCR
20.0000 meq | EXTENDED_RELEASE_TABLET | Freq: Once | ORAL | Status: AC
  Administered 2017-08-06: 20 meq via ORAL
  Filled 2017-08-06: qty 1

## 2017-08-06 MED ORDER — SODIUM CHLORIDE 0.9 % IV SOLN
INTRAVENOUS | Status: DC
Start: 2017-08-06 — End: 2017-08-06

## 2017-08-06 MED ORDER — ACETAMINOPHEN 650 MG RE SUPP: 650.0000 mg | Freq: Four times a day (QID) | RECTAL | Status: DC | PRN

## 2017-08-06 MED ORDER — POTASSIUM CHLORIDE IN NACL 20-0.9 MEQ/L-% IV SOLN
INTRAVENOUS | Status: DC
  Administered 2017-08-06 – 2017-08-07 (×3): via INTRAVENOUS

## 2017-08-06 MED ORDER — ACETAMINOPHEN 325 MG PO TABS
650.0000 mg | ORAL_TABLET | Freq: Four times a day (QID) | ORAL | Status: DC | PRN
  Administered 2017-08-07: 650 mg via ORAL
  Filled 2017-08-06: qty 2

## 2017-08-06 MED ORDER — POTASSIUM CHLORIDE 10 MEQ/100ML IV SOLN
10.0000 meq | Freq: Once | INTRAVENOUS | Status: AC
  Administered 2017-08-06: 10 meq via INTRAVENOUS
  Filled 2017-08-06: qty 100

## 2017-08-06 MED ORDER — SODIUM CHLORIDE 0.9 % IV BOLUS (SEPSIS)
500.0000 mL | Freq: Once | INTRAVENOUS | Status: AC
Start: 2017-08-06 — End: 2017-08-06
  Administered 2017-08-06: 500 mL via INTRAVENOUS

## 2017-08-06 MED ORDER — ENOXAPARIN SODIUM 40 MG/0.4ML ~~LOC~~ SOLN
40.0000 mg | SUBCUTANEOUS | Status: DC
  Administered 2017-08-06 – 2017-08-07 (×2): 40 mg via SUBCUTANEOUS
  Filled 2017-08-06 (×2): qty 0.4
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
## 2017-08-06 NOTE — ED Triage Notes (Signed)
Pt c/o generalized weakness x 1 week. Pt seen by pcp on WED. Reports not feeling any better.
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
## 2017-08-06 NOTE — ED Notes (Addendum)
Pts nephew [name] [phone] would like to be called with any updates, if he cannot be reached to contact Larry [phone]

## 2017-08-06 NOTE — ED Notes (Signed)
Dr zammit  Notified of K 2.1 and troponin 0.03

## 2017-08-06 NOTE — ED Notes (Signed)
Pt states he does not need to urinate at this time, aware of DO  

## 2017-08-07 DIAGNOSIS — E876 Hypokalemia: Secondary | ICD-10-CM | POA: Diagnosis not present

## 2017-08-07 DIAGNOSIS — I10 Essential (primary) hypertension: Secondary | ICD-10-CM | POA: Diagnosis not present

## 2017-08-07 DIAGNOSIS — N179 Acute kidney failure, unspecified: Secondary | ICD-10-CM | POA: Diagnosis not present

## 2017-08-07 DIAGNOSIS — R197 Diarrhea, unspecified: Secondary | ICD-10-CM | POA: Diagnosis not present

## 2017-08-07 DIAGNOSIS — F0390 Unspecified dementia without behavioral disturbance: Secondary | ICD-10-CM | POA: Diagnosis not present

## 2017-08-07 LAB — BASIC METABOLIC PANEL
ANION GAP: 7 (ref 5–15)
ANION GAP: 7 (ref 5–15)
BUN: 16 mg/dL (ref 6–20)
BUN: 17 mg/dL (ref 6–20)
CALCIUM: 9.4 mg/dL (ref 8.9–10.3)
CALCIUM: 9.3 mg/dL (ref 8.9–10.3)
CHLORIDE: 107 mmol/L (ref 101–111)
CO2: 30 mmol/L (ref 22–32)
CO2: 27 mmol/L (ref 22–32)
Chloride: 106 mmol/L (ref 101–111)
Creatinine, Ser: 1.13 mg/dL (ref 0.61–1.24)
Creatinine, Ser: 1.11 mg/dL (ref 0.61–1.24)
GFR calc non Af Amer: >60 mL/min (ref >60)
GFR calc non Af Amer: >60 mL/min (ref >60)
GLUCOSE: 105 mg/dL — AB (ref 65–99)
GLUCOSE: 114 mg/dL — AB (ref 65–99)
Potassium: 3.1 mmol/L — ABNORMAL LOW (ref 3.5–5.1)
Potassium: 3.2 mmol/L — ABNORMAL LOW (ref 3.5–5.1)
Sodium: 143 mmol/L (ref 135–145)
Sodium: 141 mmol/L (ref 135–145)

## 2017-08-07 LAB — URINALYSIS, ROUTINE W REFLEX MICROSCOPIC
Bilirubin Urine: NEGATIVE
GLUCOSE, UA: NEGATIVE mg/dL
HGB URINE DIPSTICK: NEGATIVE
Ketones, ur: NEGATIVE mg/dL
Leukocytes, UA: NEGATIVE
Nitrite: NEGATIVE
PH: 7 (ref 5.0–8.0)
Protein, ur: NEGATIVE mg/dL
SPECIFIC GRAVITY, URINE: 1.015 (ref 1.005–1.030)

## 2017-08-07 MED ORDER — SODIUM CHLORIDE 0.9 % IV BOLUS (SEPSIS)
500.0000 mL | Freq: Once | INTRAVENOUS | Status: AC
  Administered 2017-08-07: 500 mL via INTRAVENOUS

## 2017-08-07 MED ORDER — POTASSIUM CHLORIDE CRYS ER 20 MEQ PO TBCR
40.0000 meq | EXTENDED_RELEASE_TABLET | ORAL | Status: AC
  Administered 2017-08-07 (×2): 40 meq via ORAL
  Filled 2017-08-07 (×2): qty 2

## 2017-08-07 MED ORDER — POTASSIUM CHLORIDE CRYS ER 20 MEQ PO TBCR
40.0000 meq | EXTENDED_RELEASE_TABLET | Freq: Once | ORAL | Status: AC
  Administered 2017-08-07: 40 meq via ORAL
  Filled 2017-08-07: qty 2

## 2017-08-07 NOTE — Evaluation (Signed)
Physical Therapy Evaluation Patient Details Name: Thomas [name] MRN: [id number] DOB: Feb 23, 1951 Today's Date: 08/07/2017   History of Present Illness  Thomas [name] is a 66 y.o. male with medical history of dementia, HTN Migraines,  C spine surgery for spinal stenosis (8/18) who comes in for 3-4 days of weakness and is a found to have hypokalemia. He admits to having loose stools for the past few days, last episode was yesterday. No vomiting but his appetite has been poor and thus he has not eaten or drank much. He continued to take his medications. He is not completely aware of what he takes. His nephew whom he lives with recommended he be evaluated in the ER for his weakness.      Clinical Impression  Thomas [name] is a 66 y.o. presenting for PT evaluation with recent decrease in functional mobility secondary to above diagnosis. He currently feels he is functioning at his baseline of modified independent with no device and requiring extra time. Upon completion of objective exam patient is functioning at Mod Independent level and anticipate he will be able to return home with family support with no further PT needs when medically ready. Aute PT will discharge to RN staff for mobilizing.     Follow Up Recommendations No PT follow up    Equipment Recommendations  None recommended by PT       Precautions / Restrictions Precautions Precautions: Fall Restrictions Weight Bearing Restrictions: No      Mobility  Bed Mobility Overal bed mobility: Modified Independent    General bed mobility comments: requires extra time  Transfers Overall transfer level: Modified independent Equipment used: None    General transfer comment: requires extra time  Ambulation/Gait Ambulation/Gait assistance: Modified independent (Device/Increase time) Ambulation Distance (Feet): 15 Feet Assistive device: None Gait Pattern/deviations: WFL(Within Functional Limits);Step-through pattern   Gait velocity  interpretation: Below normal speed for age/gender General Gait Details: patient requires increased time and assist to manage lines     Balance Overall balance assessment: Modified Independent Sitting-balance support: No upper extremity supported;Feet unsupported Sitting balance-Leahy Scale: Good     Standing balance support: No upper extremity supported;During functional activity Standing balance-Leahy Scale: Good Standing balance comment: requires exterrnal support during higher balance activities Single Leg Stance - Right Leg: 2 Single Leg Stance - Left Leg: 2        Pertinent Vitals/Pain Pain Assessment: No/denies pain    Home Living Family/patient expects to be discharged to:: Private residence Living Arrangements: Other relatives(nephew) Available Help at Discharge: Family Type of Home: House Home Access: Level entry     Home Layout: One level Home Equipment: None      Prior Function Level of Independence: Independent     Comments: independent with driving, shopping and mobilizing without AD        Extremity/Trunk Assessment   Upper Extremity Assessment Upper Extremity Assessment: Overall WFL for tasks assessed    Lower Extremity Assessment Lower Extremity Assessment: Overall WFL for tasks assessed    Cervical / Trunk Assessment Cervical / Trunk Assessment: Normal  Communication   Communication: No difficulties  Cognition Arousal/Alertness: Awake/alert Behavior During Therapy: WFL for tasks assessed/performed Overall Cognitive Status: Within Functional Limits for tasks assessed            Assessment/Plan    PT Assessment Patent does not need any further PT services         PT Goals (Current goals can be found in the Care Plan section)  Acute Rehab PT Goals Patient Stated Goal: get back home and tend to teh chickens and watch my nephews son play basketball PT Goal Formulation: With patient Time For Goal Achievement: 08/12/17 Potential to  Achieve Goals: Good        AM-PAC PT "6 Clicks" Daily Activity  Outcome Measure Difficulty turning over in bed (including adjusting bedclothes, sheets and blankets)?: None Difficulty moving from lying on back to sitting on the side of the bed? : A Little Difficulty sitting down on and standing up from a chair with arms (e.g., wheelchair, bedside commode, etc,.)?: A Little Help needed moving to and from a bed to chair (including a wheelchair)?: A Little Help needed walking in hospital room?: A Little Help needed climbing 3-5 steps with a railing? : A Little 6 Click Score: 19    End of Session   Activity Tolerance: Patient tolerated treatment well Patient left: in chair;with chair alarm set;with call bell/phone within reach Nurse Communication: Mobility status PT Visit Diagnosis: Unsteadiness on feet (R26.81);Other abnormalities of gait and mobility (R26.89);Muscle weakness (generalized) (M62.81)    Time: 1000-1024 PT Time Calculation (min) (ACUTE ONLY): 24 min   Charges:   PT Evaluation $PT Eval Low Complexity: 1 Low     PT G Codes:   PT G-Codes **NOT FOR INPATIENT CLASS** Functional Assessment Tool Used: AM-PAC 6 Clicks Basic Mobility Functional Limitation: Mobility: Walking and moving around Mobility: Walking and Moving Around Current Status ([id number](G8978): At least 20 percent but less than 40 percent impaired, limited or restricted Mobility: Walking and Moving Around Goal Status [phone](G8979): At least 20 percent but less than 40 percent impaired, limited or restricted Mobility: Walking and Moving Around Discharge Status [phone](G8980): At least 20 percent but less than 40 percent impaired, limited or restricted    Glyn Adeachel Quinn, PT, DPT Physical Therapist with [facility]  08/07/2017 12:10 PM

## 2017-08-07 NOTE — Progress Notes (Signed)
PROGRESS NOTE    Thomas [name]   ZOX:[id number]  DOB: 25-Jun-1951  DOA: 08/06/2017 PCP: [facility], [facility]   Brief Narrative:   Thomas SpragueLouis M McBeeis a 66 y.o.malewith medical history ofdementia, HTN Migraines, C spine surgery for spinal stenosis (8/18)who comes in for 3-4 days of weakness and is a found to have hypokalemia. He admits to having loose stools for the past few days, last episode was yesterday. No vomiting but his appetite has been poor and thus he has not eaten or drank much. He continued to take his medications. He is not completely aware of what he takes. His nephew whom he lives with recommended he be evaluated in the ER for his weakness.      Subjective: Feels stronger than yesterday but still too weak to go home. He became light headed from sitting up in the chair. ROS: no complaints of nausea, vomiting, constipation diarrhea, cough, dyspnea or dysuria. No other complaints.   Assessment & Plan:  Hypokalemia - stop Chlorthalidone-it was actually discontinued in Aug but the patient cannot recall if he still takes it -replacing K orally and via IV- has been given 80 meq of K today but K still low- will give another 40 meq of K  - Mg normal  Active Problems: Dehydration / AKI - in setting of possibly Chlorthalidone use  and diarrhea (resolved) - Cr improved with IVF- holding Chlorthalidone - given another 500 cc bolus of NS as urine was quite dark still- decrease IVF and  Encourage oral hydration - complains of feeling lightheaded after sitting up- orthostatic vitals negative today  Generalized weakness - due to above- PT eval    Diarrhea - resolved- d/c c diff PCR  Chronically elevated troponin - no chest pain and no further work up needed  Dementia - mild- Aricept  Hypotension with h/o Essential hypertension - holding Norvasc and Zebeta - BP low on admission - now SBP 130- follow  Migraines - Topamax  C spine surgery in  8/18   DVT prophylaxis: Lovenox Code Status: Full code Family Communication:  Disposition Plan: home when stable- no PT needs Consultants:   none Procedures:   none Antimicrobials:  Anti-infectives (From admission, onward)   None       Objective: Vitals:   08/06/17 2043 08/06/17 2129 08/07/17 0408 08/07/17 1103  BP:  120/71  130/78  Pulse:  60  66  Resp:  18  18  Temp:  98 F (36.7 C)  98.3 F (36.8 C)  TempSrc:  Oral    SpO2: 96% 95%  99%  Weight:   81.6 kg (180 lb)   Height:        Intake/Output Summary (Last 24 hours) at 08/07/2017 1445 Last data filed at 08/07/2017 1300 Gross per 24 hour  Intake 1285 ml  Output 750 ml  Net 535 ml   Filed Weights   08/06/17 1023 08/06/17 1449 08/07/17 0408  Weight: 82.6 kg (182 lb) 82.6 kg (182 lb) 81.6 kg (180 lb)    Examination: General exam: Appears comfortable  HEENT: PERRLA, oral mucosa moist, no sclera icterus or thrush Respiratory system: Clear to auscultation. Respiratory effort normal. Cardiovascular system: S1 & S2 heard, RRR.  No murmurs  Gastrointestinal system: Abdomen soft, non-tender, nondistended. Normal bowel sound. No organomegaly Central nervous system: Alert and oriented. No focal neurological deficits. Extremities: No cyanosis, clubbing or edema Skin: No rashes or ulcers Psychiatry:  Mood & affect appropriate.     Data Reviewed: I  have personally reviewed following labs and imaging studies  CBC: Recent Labs  Lab 08/06/17 1030  WBC 10.0  NEUTROABS 6.4  HGB 14.9  HCT 43.7  MCV 92.4  PLT 359   Basic Metabolic Panel: Recent Labs  Lab 08/06/17 1030 08/07/17 0627 08/07/17 1349  NA 138 143 141  K 2.1* 3.1* 3.2*  CL 95* 106 107  CO2 32 30 27  GLUCOSE 123* 105* 114*  BUN 19 16 17   CREATININE 1.48* 1.13 1.11  CALCIUM 9.6 9.4 9.3  MG 2.1  --   --    GFR: Estimated Creatinine Clearance: 61.6 mL/min (by C-G formula based on SCr of 1.11 mg/dL). Liver Function Tests: Recent Labs   Lab 08/06/17 1030  AST 29  ALT 17  ALKPHOS 123  BILITOT 0.7  PROT 8.8*  ALBUMIN 4.5   Recent Labs  Lab 08/06/17 1030  LIPASE 29   No results for input(s): AMMONIA in the last 168 hours. Coagulation Profile: No results for input(s): INR, PROTIME in the last 168 hours. Cardiac Enzymes: Recent Labs  Lab 08/06/17 1030  TROPONINI 0.03*   BNP (last 3 results) No results for input(s): PROBNP in the last 8760 hours. HbA1C: No results for input(s): HGBA1C in the last 72 hours. CBG: No results for input(s): GLUCAP in the last 168 hours. Lipid Profile: No results for input(s): CHOL, HDL, LDLCALC, TRIG, CHOLHDL, LDLDIRECT in the last 72 hours. Thyroid Function Tests: Recent Labs    08/06/17 1030  TSH 0.893   Anemia Panel: No results for input(s): VITAMINB12, FOLATE, FERRITIN, TIBC, IRON, RETICCTPCT in the last 72 hours. Urine analysis:    Component Value Date/Time   COLORURINE YELLOW 08/06/2017 1631   APPEARANCEUR CLEAR 08/06/2017 1631   LABSPEC 1.015 08/06/2017 1631   PHURINE 7.0 08/06/2017 1631   GLUCOSEU NEGATIVE 08/06/2017 1631   HGBUR NEGATIVE 08/06/2017 1631   BILIRUBINUR NEGATIVE 08/06/2017 1631   KETONESUR NEGATIVE 08/06/2017 1631   PROTEINUR NEGATIVE 08/06/2017 1631   UROBILINOGEN 4.0 (H) 05/22/2010 1919   NITRITE NEGATIVE 08/06/2017 1631   LEUKOCYTESUR NEGATIVE 08/06/2017 1631   Sepsis Labs: @LABRCNTIP (procalcitonin:4,lacticidven:4) )No results found for this or any previous visit (from the past 240 hour(s)).       Radiology Studies: Dg Chest 2 View  Result Date: 08/06/2017 CLINICAL DATA:  Generalized weakness EXAM: CHEST  2 VIEW COMPARISON:  04/29/2017 FINDINGS: Heart is borderline in size. Lungs are clear. No effusions or edema. No acute bony abnormality. IMPRESSION: No active cardiopulmonary disease. Electronically Signed   By: Charlett NoseKevin  Dover M.D.   On: 08/06/2017 13:23      Scheduled Meds: . enoxaparin (LOVENOX) injection  40 mg Subcutaneous  Q24H   Continuous Infusions: . 0.9 % NaCl with KCl 20 mEq / L 75 mL/hr at 08/07/17 0507     LOS: 0 days    Time spent in minutes: 35    Calvert CantorSaima Liseth Wann, MD [facility] Pager: [email or web address] Password [email or web address] 08/07/2017, 2:45 PM

## 2017-08-07 NOTE — Progress Notes (Signed)
Pt ambulated in hall 50 feet.  No complaints of dizziness.

## 2017-08-08 DIAGNOSIS — E876 Hypokalemia: Secondary | ICD-10-CM

## 2017-08-08 DIAGNOSIS — I10 Essential (primary) hypertension: Secondary | ICD-10-CM

## 2017-08-08 DIAGNOSIS — F0390 Unspecified dementia without behavioral disturbance: Secondary | ICD-10-CM

## 2017-08-08 DIAGNOSIS — N179 Acute kidney failure, unspecified: Secondary | ICD-10-CM | POA: Diagnosis not present

## 2017-08-08 DIAGNOSIS — R197 Diarrhea, unspecified: Secondary | ICD-10-CM | POA: Diagnosis not present

## 2017-08-08 LAB — BASIC METABOLIC PANEL
Anion gap: 6 (ref 5–15)
BUN: 11 mg/dL (ref 6–20)
CO2: 26 mmol/L (ref 22–32)
Calcium: 9.1 mg/dL (ref 8.9–10.3)
Chloride: 108 mmol/L (ref 101–111)
Creatinine, Ser: 1 mg/dL (ref 0.61–1.24)
GFR calc Af Amer: >60 mL/min (ref >60)
GFR calc non Af Amer: >60 mL/min (ref >60)
Glucose, Bld: 104 mg/dL — ABNORMAL HIGH (ref 65–99)
Potassium: 3.5 mmol/L (ref 3.5–5.1)
Sodium: 140 mmol/L (ref 135–145)

## 2017-08-08 MED ORDER — POTASSIUM CHLORIDE CRYS ER 20 MEQ PO TBCR: 20.0000 meq | EXTENDED_RELEASE_TABLET | Freq: Every day | ORAL | 0 refills | Status: DC

## 2017-08-08 MED ORDER — BISOPROLOL FUMARATE 5 MG PO TABS: 5.0000 mg | ORAL_TABLET | Freq: Every day | ORAL | Status: DC

## 2017-08-08 MED ORDER — AMLODIPINE BESYLATE 10 MG PO TABS: 10.0000 mg | ORAL_TABLET | Freq: Every day | ORAL | 3 refills | Status: DC

## 2017-08-08 NOTE — Discharge Summary (Signed)
Physician Discharge Summary  Sherlean FootLouis M Olshefski ZOX:[id number] DOB: 08/21/1951 DOA: 08/06/2017  PCP: [facility]hen MayPllc, [facility]  Admit date: 08/06/2017 Discharge date: 08/08/2017  Time spent: 35 minutes  Recommendations for Outpatient Follow-up:  1. Repeat basic metabolic panel to follow electrolytes and renal function 2. Reassess blood pressure and adjust antihypertensive regimen as needed   Discharge Diagnoses:  Principal Problem:   Hypokalemia Active Problems:   Dementia   Essential hypertension   AKI (acute kidney injury) (HCC)   Diarrhea   Discharge Condition: Stable and improved.  Patient discharged home with instruction to follow-up with PCP in 10 days.  Diet recommendation: Heart healthy diet  Filed Weights   08/06/17 1449 08/07/17 0408 08/08/17 0413  Weight: 82.6 kg (182 lb) 81.6 kg (180 lb) 82.1 kg (181 lb)    History of present illness:  66 y.o.malewith medical history ofdementia, HTN Migraines, C spine surgery for spinal stenosis (8/18)who comes in for 3-4 days of weakness and is a found to have hypokalemia. He admits to having loose stools for the past few days, last episode was the day prior to admission. No vomiting but his appetite has been poor and thus he has not eaten or drank much. He has continued to take his medications. He is not completely aware of what he takes. His nephew whom he lives with recommended he be evaluated in the ER for his weakness. Patient admitted for dehydration, AKI and hypokalemia    Hospital Course:  Hypokalemia - stop Chlorthalidone-it was actually discontinued in Aug but the patient, continue taking it by mistake.   -Potassium repleted and patient has been discharged on 20 mEq on daily basis. -Mg normal -Please repeat basic metabolic panel at follow-up visit to reassess electrolytes trend.  Dehydration / AKI - in setting of possibly Chlorthalidone use  and diarrhea (presumed from viral gastroenteritis, which she is now  resolved) -Renal function back to normal with fluid resuscitation and holding nephrotoxic agents. -Patient chlorthalidone has been discontinued  -Advised to keep himself well-hydrated  -No orthostatic changes on his vital signs prior to discharge. -Antihypertensive regimen has been adjusted to maintain better control of his blood pressure with increase in the risk/chances for low BP.  Generalized weakness -due to above -PT evaluated patient has no recommendation for the needs of PT follow-up at this moment.     Diarrhea -resolved -Denies nausea, vomiting, abdominal pain or any other complaints.  Chronically elevated troponin -no chest pain and no shortness of breath  -No acute ischemic changes on EKG  -No further work up needed  Dementia -mild -no behavioral disturbances appreciated -Continue Aricept  Hypotension with h/o Essential hypertension -will discontinue chlorthalidone; cut Zebeta in half and asked to hold norvasc until follow up with PCP -instructed to follow low sodium diet -BP stable and WNL at discharge  Migraines -Continue Topamax    Procedures:  See below for x-ray report  Consultations:  None  Discharge Exam: Vitals:   08/08/17 0543 08/08/17 1400  BP: 107/76 113/76  Pulse: (!) 55 (!) 59  Resp: 16 18  Temp: 98.2 F (36.8 C) 98.2 F (36.8 C)  SpO2: 100% 99%    General: Afebrile, denies chest pain, no shortness of breath, no palpitations, no nausea or vomiting. Denies abdominal pain. Cardiovascular: S1 and S2, no rubs, no gallops, positive soft systolic ejection murmur, no JVD. Respiratory: Clear to auscultation bilaterally, no using accessory muscles, normal respiratory effort. Abdomen: Soft, nontender, nondistended, positive bowel sounds. Extremities: No cyanosis, no clubbing,  no edema appreciated. Psychiatry: Normal mood and affect, patient was oriented x3 and with good insight.  Discharge Instructions   Discharge Instructions     Diet - low sodium heart healthy   Complete by:  As directed    Discharge instructions   Complete by:  As directed    Take medications as prescribed Keep yourself well-hydrated Arrange follow-up with PCP in 1 week Please hold the use of amlodipine also follow-up with your primary care doctor Noticed that your Zebeta dose has been adjusted to 5 mg daily instead of 10mg  Chlorthalidone medication has now been discontinued Monitor your sodium intake and watch your weight regularly.     Current Discharge Medication List    START taking these medications   Details  potassium chloride SA (K-DUR,KLOR-CON) 20 MEQ tablet Take 1 tablet (20 mEq total) daily by mouth. Qty: 20 tablet, Refills: 0      CONTINUE these medications which have CHANGED   Details  amLODipine (NORVASC) 10 MG tablet Take 1 tablet (10 mg total) daily by mouth. Please hold until follow up with PCP Qty: 90 tablet, Refills: 3    bisoprolol (ZEBETA) 5 MG tablet Take 1 tablet (5 mg total) daily by mouth.      CONTINUE these medications which have NOT CHANGED   Details  donepezil (ARICEPT) 5 MG tablet Take 1 tablet (5 mg total) at bedtime by mouth. Qty: 30 tablet, Refills: 3    meclizine (ANTIVERT) 25 MG tablet Take 25 mg every 6 (six) hours by mouth. For 10 days Refills: 2    meloxicam (MOBIC) 7.5 MG tablet Take 7.5 mg 2 (two) times daily by mouth.    mirtazapine (REMERON) 7.5 MG tablet Take 7.5 mg at bedtime by mouth. Refills: 11    topiramate (TOPAMAX) 25 MG tablet Take 25 mg 2 (two) times daily by mouth. Refills: 0    aspirin 81 MG tablet Take 81 mg by mouth daily.      STOP taking these medications     chlorthalidone (HYGROTON) 25 MG tablet        No Known Allergies Follow-up Information    [facility], [facility] Follow up.   Specialty:  Family Medicine Contact information: [address] [place] [id number] [phone]           The results of significant  diagnostics from this hospitalization (including imaging, microbiology, ancillary and laboratory) are listed below for reference.    Significant Diagnostic Studies: Dg Chest 2 View  Result Date: 08/06/2017 CLINICAL DATA:  Generalized weakness EXAM: CHEST  2 VIEW COMPARISON:  04/29/2017 FINDINGS: Heart is borderline in size. Lungs are clear. No effusions or edema. No acute bony abnormality. IMPRESSION: No active cardiopulmonary disease. Electronically Signed   By: Charlett NoseKevin  Dover M.D.   On: 08/06/2017 13:23   Labs: Basic Metabolic Panel: Recent Labs  Lab 08/06/17 1030 08/07/17 0627 08/07/17 1349 08/08/17 0614  NA 138 143 141 140  K 2.1* 3.1* 3.2* 3.5  CL 95* 106 107 108  CO2 32 30 27 26   GLUCOSE 123* 105* 114* 104*  BUN 19 16 17 11   CREATININE 1.48* 1.13 1.11 1.00  CALCIUM 9.6 9.4 9.3 9.1  MG 2.1  --   --   --    Liver Function Tests: Recent Labs  Lab 08/06/17 1030  AST 29  ALT 17  ALKPHOS 123  BILITOT 0.7  PROT 8.8*  ALBUMIN 4.5   Recent Labs  Lab 08/06/17 1030  LIPASE  29   CBC: Recent Labs  Lab 08/06/17 1030  WBC 10.0  NEUTROABS 6.4  HGB 14.9  HCT 43.7  MCV 92.4  PLT 359   Cardiac Enzymes: Recent Labs  Lab 08/06/17 1030  TROPONINI 0.03*    Signed:  Vassie Loll MD.  [facility] 08/08/2017, 3:11 PM

## 2017-08-08 NOTE — Progress Notes (Signed)
Pt's IV catheter removed and intact. Pt's IV site clean dry and intact. Discharge instructions including medications and follow up appointments were reviewed and discussed with patient's nephew Thomas [name]. All questions were answered and no further questions at this time. Pt in stable condition and in no acute distress at time of discharge. Pt escorted by nurse tech.

## 2017-08-08 NOTE — Care Management Note (Signed)
Case Management Note  Patient Details  Name: Thomas [name] MRN: [id number] Date of Birth: [birth year]  Subjective/Objective:        Admitted with hypokalemia. Chart reviewed for CM needs. Pt from home, lives with nephew. Has PCP, transportation and insurance with drug coverage. Pt has no HH or DME needs pta.             Action/Plan: DC home today. No CM needs noted at DC.   Expected Discharge Date:  08/08/17               Expected Discharge Plan:  Home/Self Care  In-House Referral:  NA  Discharge planning Services  NA  Post Acute Care Choice:  NA Choice offered to:  NA  Status of Service:  Completed, signed off  Malcolm MetroChildress, Mylik Pro Demske, RN 08/08/2017, 3:12 PM

## 2017-08-19 DIAGNOSIS — M5030 Other cervical disc degeneration, unspecified cervical region: Secondary | ICD-10-CM | POA: Diagnosis not present

## 2017-08-19 DIAGNOSIS — Z6827 Body mass index (BMI) 27.0-27.9, adult: Secondary | ICD-10-CM | POA: Diagnosis not present

## 2017-08-19 DIAGNOSIS — Z981 Arthrodesis status: Secondary | ICD-10-CM | POA: Diagnosis not present

## 2017-08-19 DIAGNOSIS — M47812 Spondylosis without myelopathy or radiculopathy, cervical region: Secondary | ICD-10-CM | POA: Diagnosis not present

## 2017-08-19 DIAGNOSIS — I10 Essential (primary) hypertension: Secondary | ICD-10-CM | POA: Diagnosis not present

## 2017-08-19 DIAGNOSIS — M542 Cervicalgia: Secondary | ICD-10-CM | POA: Diagnosis not present

## 2017-08-19 DIAGNOSIS — M4802 Spinal stenosis, cervical region: Secondary | ICD-10-CM | POA: Diagnosis not present

## 2017-09-07 DIAGNOSIS — Z23 Encounter for immunization: Secondary | ICD-10-CM | POA: Diagnosis not present

## 2017-09-09 DIAGNOSIS — E663 Overweight: Secondary | ICD-10-CM | POA: Diagnosis not present

## 2017-09-09 DIAGNOSIS — Z6827 Body mass index (BMI) 27.0-27.9, adult: Secondary | ICD-10-CM | POA: Diagnosis not present

## 2017-09-25 ENCOUNTER — Other Ambulatory Visit: Payer: Self-pay | Admitting: Neurology

## 2017-09-25 DIAGNOSIS — G44209 Tension-type headache, unspecified, not intractable: Secondary | ICD-10-CM

## 2017-09-26 ENCOUNTER — Other Ambulatory Visit: Payer: Self-pay

## 2017-09-26 ENCOUNTER — Telehealth: Payer: Self-pay | Admitting: Student

## 2017-09-26 MED ORDER — TOPIRAMATE 25 MG PO TABS: 25.0000 mg | ORAL_TABLET | Freq: Two times a day (BID) | ORAL | 3 refills | Status: DC
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
## 2017-09-26 NOTE — Telephone Encounter (Signed)
Per phone call--he's been having some chest pain, scheduled to see B. Strader tom.

## 2017-09-26 NOTE — Telephone Encounter (Signed)
Patient reports 1 minute of chest pain last Thursday (4 days ago) but is now resolved.hs f/u apt tomorrow.patient will call back if anything changes

## 2017-09-27 ENCOUNTER — Ambulatory Visit (INDEPENDENT_AMBULATORY_CARE_PROVIDER_SITE_OTHER): Payer: Medicare Other | Admitting: Student

## 2017-09-27 ENCOUNTER — Encounter: Payer: Self-pay | Admitting: Student

## 2017-09-27 VITALS — BP 106/60 | HR 62 | Ht 68.0 in | Wt 187.0 lb

## 2017-09-27 DIAGNOSIS — E876 Hypokalemia: Secondary | ICD-10-CM | POA: Diagnosis not present

## 2017-09-27 DIAGNOSIS — I10 Essential (primary) hypertension: Secondary | ICD-10-CM

## 2017-09-27 DIAGNOSIS — R0789 Other chest pain: Secondary | ICD-10-CM | POA: Diagnosis not present

## 2017-09-27 NOTE — Patient Instructions (Signed)
Medication Instructions:  Your physician recommends that you continue on your current medications as directed. Please refer to the Current Medication list given to you today.  May Take Tums as Needed for Acid Reflux   Labwork: NONE   Testing/Procedures: NONE   Follow-Up: Your physician wants you to follow-up in: 6 Months with Dr. Wyline MoodBranch. You will receive a reminder letter in the mail two months in advance. If you don't receive a letter, please call our office to schedule the follow-up appointment.   Any Other Special Instructions Will Be Listed Below (If Applicable).     If you need a refill on your cardiac medications before your next appointment, please call your pharmacy. Thank you for choosing [facility]!

## 2017-10-11 DIAGNOSIS — N400 Enlarged prostate without lower urinary tract symptoms: Secondary | ICD-10-CM | POA: Diagnosis not present

## 2017-10-11 DIAGNOSIS — E6609 Other obesity due to excess calories: Secondary | ICD-10-CM | POA: Diagnosis not present

## 2017-10-11 DIAGNOSIS — I10 Essential (primary) hypertension: Secondary | ICD-10-CM | POA: Diagnosis not present
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
## 2017-10-27 ENCOUNTER — Encounter: Payer: Self-pay | Admitting: Nurse Practitioner

## 2017-10-27 ENCOUNTER — Encounter (INDEPENDENT_AMBULATORY_CARE_PROVIDER_SITE_OTHER): Payer: Self-pay

## 2017-10-27 ENCOUNTER — Ambulatory Visit (INDEPENDENT_AMBULATORY_CARE_PROVIDER_SITE_OTHER): Payer: Medicare Other | Admitting: Nurse Practitioner

## 2017-10-27 VITALS — BP 141/82 | HR 59 | Wt 193.2 lb

## 2017-10-27 DIAGNOSIS — F0390 Unspecified dementia without behavioral disturbance: Secondary | ICD-10-CM | POA: Diagnosis not present

## 2017-10-27 MED ORDER — DONEPEZIL HCL 5 MG PO TABS: 5.0000 mg | ORAL_TABLET | Freq: Every day | ORAL | 6 refills | Status: DC

## 2017-10-27 NOTE — Progress Notes (Signed)
I agree with the assessment and plan as directed by NP .   Diandra Cimini, MD  

## 2017-10-27 NOTE — Patient Instructions (Signed)
Continue  Aricept at 5 mg daily with food will refill Stay well-hydrated Exercise daily by walking Follow a routine, no multitasking Follow-up in 6 months

## 2017-11-16 DIAGNOSIS — Z6828 Body mass index (BMI) 28.0-28.9, adult: Secondary | ICD-10-CM | POA: Diagnosis not present

## 2017-11-16 DIAGNOSIS — I10 Essential (primary) hypertension: Secondary | ICD-10-CM | POA: Diagnosis not present

## 2017-11-16 DIAGNOSIS — E663 Overweight: Secondary | ICD-10-CM | POA: Diagnosis not present

## 2017-11-16 DIAGNOSIS — E876 Hypokalemia: Secondary | ICD-10-CM | POA: Diagnosis not present

## 2017-11-25 ENCOUNTER — Other Ambulatory Visit (HOSPITAL_COMMUNITY): Payer: Self-pay | Admitting: Family Medicine

## 2017-11-25 ENCOUNTER — Ambulatory Visit (HOSPITAL_COMMUNITY)
Admission: RE | Admit: 2017-11-25 | Discharge: 2017-11-25 | Disposition: A | Discharge status: Home / Self Care | Payer: Medicare Other | Source: Ambulatory Visit | Attending: Family Medicine | Admitting: Family Medicine

## 2017-11-25 DIAGNOSIS — R109 Unspecified abdominal pain: Secondary | ICD-10-CM | POA: Diagnosis not present

## 2017-11-25 DIAGNOSIS — R1013 Epigastric pain: Secondary | ICD-10-CM | POA: Insufficient documentation

## 2017-11-25 DIAGNOSIS — Z1389 Encounter for screening for other disorder: Secondary | ICD-10-CM | POA: Diagnosis not present

## 2017-11-25 DIAGNOSIS — E663 Overweight: Secondary | ICD-10-CM | POA: Diagnosis not present

## 2017-11-25 DIAGNOSIS — Z6828 Body mass index (BMI) 28.0-28.9, adult: Secondary | ICD-10-CM | POA: Diagnosis not present

## 2017-12-07 DIAGNOSIS — E876 Hypokalemia: Secondary | ICD-10-CM | POA: Diagnosis not present

## 2017-12-08 DIAGNOSIS — R195 Other fecal abnormalities: Secondary | ICD-10-CM | POA: Diagnosis not present

## 2017-12-08 DIAGNOSIS — R109 Unspecified abdominal pain: Secondary | ICD-10-CM | POA: Diagnosis not present

## 2017-12-08 DIAGNOSIS — Z6829 Body mass index (BMI) 29.0-29.9, adult: Secondary | ICD-10-CM | POA: Diagnosis not present

## 2017-12-08 DIAGNOSIS — E663 Overweight: Secondary | ICD-10-CM | POA: Diagnosis not present

## 2017-12-12 ENCOUNTER — Encounter (HOSPITAL_COMMUNITY): Payer: Self-pay | Admitting: Emergency Medicine

## 2017-12-12 ENCOUNTER — Other Ambulatory Visit: Payer: Self-pay

## 2017-12-12 ENCOUNTER — Emergency Department (HOSPITAL_COMMUNITY): Payer: Medicare Other

## 2017-12-12 ENCOUNTER — Emergency Department (HOSPITAL_COMMUNITY)
Admission: EM | Admit: 2017-12-12 | Discharge: 2017-12-12 | Disposition: A | Discharge status: Home / Self Care | Payer: Medicare Other | Attending: Emergency Medicine | Admitting: Emergency Medicine

## 2017-12-12 DIAGNOSIS — R110 Nausea: Secondary | ICD-10-CM | POA: Diagnosis not present

## 2017-12-12 DIAGNOSIS — R4182 Altered mental status, unspecified: Secondary | ICD-10-CM | POA: Diagnosis not present

## 2017-12-12 DIAGNOSIS — R1011 Right upper quadrant pain: Secondary | ICD-10-CM

## 2017-12-12 DIAGNOSIS — R42 Dizziness and giddiness: Secondary | ICD-10-CM

## 2017-12-12 DIAGNOSIS — I10 Essential (primary) hypertension: Secondary | ICD-10-CM | POA: Insufficient documentation

## 2017-12-12 DIAGNOSIS — Z79899 Other long term (current) drug therapy: Secondary | ICD-10-CM | POA: Insufficient documentation

## 2017-12-12 DIAGNOSIS — Z7982 Long term (current) use of aspirin: Secondary | ICD-10-CM | POA: Insufficient documentation

## 2017-12-12 DIAGNOSIS — F0390 Unspecified dementia without behavioral disturbance: Secondary | ICD-10-CM | POA: Diagnosis not present

## 2017-12-12 DIAGNOSIS — R350 Frequency of micturition: Secondary | ICD-10-CM | POA: Diagnosis not present

## 2017-12-12 DIAGNOSIS — K760 Fatty (change of) liver, not elsewhere classified: Secondary | ICD-10-CM | POA: Diagnosis not present

## 2017-12-12 LAB — CBC WITH DIFFERENTIAL/PLATELET
BASOS ABS: 0 10*3/uL (ref 0.0–0.1)
BASOS PCT: 0 %
Eosinophils Absolute: 0.2 10*3/uL (ref 0.0–0.7)
Eosinophils Relative: 3 %
HEMATOCRIT: 38 % — AB (ref 39.0–52.0)
HEMOGLOBIN: 12.1 g/dL — AB (ref 13.0–17.0)
Lymphocytes Relative: 25 %
Lymphs Abs: 1.8 10*3/uL (ref 0.7–4.0)
MCH: 30.2 pg (ref 26.0–34.0)
MCHC: 31.8 g/dL (ref 30.0–36.0)
MCV: 94.8 fL (ref 78.0–100.0)
Monocytes Absolute: 0.4 10*3/uL (ref 0.1–1.0)
Monocytes Relative: 5 %
NEUTROS ABS: 4.8 10*3/uL (ref 1.7–7.7)
NEUTROS PCT: 67 %
Platelets: 353 10*3/uL (ref 150–400)
RBC: 4.01 MIL/uL — AB (ref 4.22–5.81)
RDW: 15.1 % (ref 11.5–15.5)
WBC: 7.2 10*3/uL (ref 4.0–10.5)

## 2017-12-12 LAB — URINALYSIS, ROUTINE W REFLEX MICROSCOPIC
Bilirubin Urine: NEGATIVE
GLUCOSE, UA: NEGATIVE mg/dL
Hgb urine dipstick: NEGATIVE
Ketones, ur: NEGATIVE mg/dL
LEUKOCYTES UA: NEGATIVE
Nitrite: NEGATIVE
PH: 5 (ref 5.0–8.0)
Protein, ur: NEGATIVE mg/dL
Specific Gravity, Urine: 1.014 (ref 1.005–1.030)

## 2017-12-12 LAB — COMPREHENSIVE METABOLIC PANEL
ALBUMIN: 3.7 g/dL (ref 3.5–5.0)
ALT: 18 U/L (ref 17–63)
AST: 31 U/L (ref 15–41)
Alkaline Phosphatase: 81 U/L (ref 38–126)
Anion gap: 9 (ref 5–15)
BILIRUBIN TOTAL: 0.5 mg/dL (ref 0.3–1.2)
BUN: 11 mg/dL (ref 6–20)
CO2: 21 mmol/L — ABNORMAL LOW (ref 22–32)
Calcium: 9.4 mg/dL (ref 8.9–10.3)
Chloride: 111 mmol/L (ref 101–111)
Creatinine, Ser: 1.07 mg/dL (ref 0.61–1.24)
GFR calc Af Amer: >60 mL/min (ref >60)
GFR calc non Af Amer: >60 mL/min (ref >60)
GLUCOSE: 103 mg/dL — AB (ref 65–99)
POTASSIUM: 3.6 mmol/L (ref 3.5–5.1)
Sodium: 141 mmol/L (ref 135–145)
TOTAL PROTEIN: 7.7 g/dL (ref 6.5–8.1)

## 2017-12-12 LAB — TROPONIN I: Troponin I: 0.03 ng/mL (ref <0.03)

## 2017-12-12 LAB — MAGNESIUM: Magnesium: 1.7 mg/dL (ref 1.7–2.4)

## 2017-12-12 LAB — CBG MONITORING, ED: GLUCOSE-CAPILLARY: 85 mg/dL (ref 65–99)

## 2017-12-12 MED ORDER — SODIUM CHLORIDE 0.9 % IV BOLUS (SEPSIS)
1000.0000 mL | Freq: Once | INTRAVENOUS | Status: AC
  Administered 2017-12-12: 1000 mL via INTRAVENOUS
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
## 2017-12-12 NOTE — ED Triage Notes (Signed)
Pt states that starting yesterday he became "woozy" with "nausea". Also states he has had frequent urination (going every 15 minutes) and a dry mouth.

## 2017-12-12 NOTE — ED Notes (Signed)
Tolerated ambulating in hall very well.  SR 66, Sats 100% on RA, and denies any pain, discomfort or SOB.

## 2017-12-12 NOTE — ED Provider Notes (Signed)
2:36 PM Assumed care from Dr. Manus Gunningancour, please see their note for full history, physical and decision making until this point. In brief this is a 67 y.o. year old male who presented to the ED tonight with multiple complaints     Patient is here for generalized weakness and lightheadedness.  Had some nausea and intermittent right upper quadrant symptoms so pending ultrasound.  Patient otherwise stable for discharge.  Will ambulate in hall.  His workup was unremarkable.  Patient is tolerating p.o. normally is able to ambulate without difficulty.  I discussed with him and his nephew and will discharge for PCP and GI follow-up for his abdominal pain and other complaints.  Discharge instructions, including strict return precautions for new or worsening symptoms, given. Patient and/or family verbalized understanding and agreement with the plan as described.   Labs, studies and imaging reviewed by myself and considered in medical decision making if ordered. Imaging interpreted by radiology.  Labs Reviewed  CBC WITH DIFFERENTIAL/PLATELET - Abnormal; Notable for the following components:      Result Value   RBC 4.01 (*)    Hemoglobin 12.1 (*)    HCT 38.0 (*)    All other components within normal limits  COMPREHENSIVE METABOLIC PANEL - Abnormal; Notable for the following components:   CO2 21 (*)    Glucose, Bld 103 (*)    All other components within normal limits  TROPONIN I - Abnormal; Notable for the following components:   Troponin I 0.03 (*)    All other components within normal limits  URINALYSIS, ROUTINE W REFLEX MICROSCOPIC  MAGNESIUM  CBG MONITORING, ED    US Abdomen Limited RUQ  Final Result      No follow-ups on file.    Marily MemosMesner, Finleigh Cheong, MD 12/12/17 1436

## 2017-12-12 NOTE — ED Notes (Signed)
Pt's nephew [name] can be reached at [phone].

## 2017-12-12 NOTE — ED Notes (Signed)
CRITICAL VALUE ALERT  Critical Value:  Troponin 0.03  Date & Time Notied:  12/12/17 @ 0708  Provider Notified: Dr Manus Gunningancour  Orders Received/Actions taken: see new orders.

## 2017-12-12 NOTE — ED Notes (Signed)
US completed at bedside.

## 2017-12-12 NOTE — Discharge Instructions (Signed)
Your potassium is normal.  Follow-up with your doctor.  Return to the ED if you develop new or worsening symptoms.

## 2017-12-13 ENCOUNTER — Encounter: Payer: Self-pay | Admitting: Internal Medicine

## 2017-12-16 ENCOUNTER — Emergency Department (HOSPITAL_COMMUNITY): Payer: Medicare Other

## 2017-12-16 ENCOUNTER — Other Ambulatory Visit: Payer: Self-pay

## 2017-12-16 ENCOUNTER — Emergency Department (HOSPITAL_COMMUNITY)
Admission: EM | Admit: 2017-12-16 | Discharge: 2017-12-16 | Disposition: A | Discharge status: Home / Self Care | Payer: Medicare Other | Attending: Emergency Medicine | Admitting: Emergency Medicine

## 2017-12-16 ENCOUNTER — Encounter (HOSPITAL_COMMUNITY): Payer: Self-pay

## 2017-12-16 DIAGNOSIS — R1110 Vomiting, unspecified: Secondary | ICD-10-CM | POA: Insufficient documentation

## 2017-12-16 DIAGNOSIS — R197 Diarrhea, unspecified: Secondary | ICD-10-CM | POA: Insufficient documentation

## 2017-12-16 DIAGNOSIS — M47812 Spondylosis without myelopathy or radiculopathy, cervical region: Secondary | ICD-10-CM | POA: Diagnosis not present

## 2017-12-16 DIAGNOSIS — Z981 Arthrodesis status: Secondary | ICD-10-CM | POA: Diagnosis not present

## 2017-12-16 DIAGNOSIS — I10 Essential (primary) hypertension: Secondary | ICD-10-CM | POA: Diagnosis not present

## 2017-12-16 DIAGNOSIS — R109 Unspecified abdominal pain: Secondary | ICD-10-CM | POA: Diagnosis not present

## 2017-12-16 DIAGNOSIS — R110 Nausea: Secondary | ICD-10-CM

## 2017-12-16 DIAGNOSIS — M5030 Other cervical disc degeneration, unspecified cervical region: Secondary | ICD-10-CM | POA: Diagnosis not present

## 2017-12-16 DIAGNOSIS — R112 Nausea with vomiting, unspecified: Secondary | ICD-10-CM | POA: Diagnosis not present

## 2017-12-16 DIAGNOSIS — R9431 Abnormal electrocardiogram [ECG] [EKG]: Secondary | ICD-10-CM | POA: Diagnosis not present

## 2017-12-16 DIAGNOSIS — F0390 Unspecified dementia without behavioral disturbance: Secondary | ICD-10-CM | POA: Insufficient documentation

## 2017-12-16 LAB — COMPREHENSIVE METABOLIC PANEL
ALK PHOS: 86 U/L (ref 38–126)
ALT: 28 U/L (ref 17–63)
AST: 45 U/L — AB (ref 15–41)
Albumin: 4.2 g/dL (ref 3.5–5.0)
Anion gap: 13 (ref 5–15)
BILIRUBIN TOTAL: 0.5 mg/dL (ref 0.3–1.2)
BUN: 16 mg/dL (ref 6–20)
CALCIUM: 10.1 mg/dL (ref 8.9–10.3)
CO2: 20 mmol/L — ABNORMAL LOW (ref 22–32)
CREATININE: 1.09 mg/dL (ref 0.61–1.24)
Chloride: 111 mmol/L (ref 101–111)
GFR calc Af Amer: >60 mL/min (ref >60)
Glucose, Bld: 120 mg/dL — ABNORMAL HIGH (ref 65–99)
Potassium: 4.1 mmol/L (ref 3.5–5.1)
Sodium: 144 mmol/L (ref 135–145)
TOTAL PROTEIN: 8.7 g/dL — AB (ref 6.5–8.1)

## 2017-12-16 LAB — URINALYSIS, ROUTINE W REFLEX MICROSCOPIC
Bilirubin Urine: NEGATIVE
GLUCOSE, UA: NEGATIVE mg/dL
Hgb urine dipstick: NEGATIVE
KETONES UR: NEGATIVE mg/dL
LEUKOCYTES UA: NEGATIVE
Nitrite: NEGATIVE
PROTEIN: NEGATIVE mg/dL
Specific Gravity, Urine: 1.026 (ref 1.005–1.030)
pH: 5 (ref 5.0–8.0)

## 2017-12-16 LAB — LIPASE, BLOOD: Lipase: 41 U/L (ref 11–51)

## 2017-12-16 LAB — CBC
HCT: 40.6 % (ref 39.0–52.0)
Hemoglobin: 13.3 g/dL (ref 13.0–17.0)
MCH: 30.9 pg (ref 26.0–34.0)
MCHC: 32.8 g/dL (ref 30.0–36.0)
MCV: 94.4 fL (ref 78.0–100.0)
PLATELETS: 377 10*3/uL (ref 150–400)
RBC: 4.3 MIL/uL (ref 4.22–5.81)
RDW: 15 % (ref 11.5–15.5)
WBC: 7.6 10*3/uL (ref 4.0–10.5)

## 2017-12-16 LAB — TROPONIN I: Troponin I: 0.03 ng/mL (ref <0.03)

## 2017-12-16 MED ORDER — ONDANSETRON HCL 4 MG/2ML IJ SOLN
4.0000 mg | Freq: Once | INTRAMUSCULAR | Status: AC
  Administered 2017-12-16: 4 mg via INTRAVENOUS
  Filled 2017-12-16: qty 2

## 2017-12-16 MED ORDER — SODIUM CHLORIDE 0.9 % IV BOLUS
1000.0000 mL | Freq: Once | INTRAVENOUS | Status: AC
  Administered 2017-12-16: 1000 mL via INTRAVENOUS

## 2017-12-16 MED ORDER — SUCRALFATE 1 GM/10ML PO SUSP: 1.0000 g | Freq: Three times a day (TID) | ORAL | 0 refills | Status: DC

## 2017-12-16 MED ORDER — IOPAMIDOL (ISOVUE-300) INJECTION 61%
100.0000 mL | Freq: Once | INTRAVENOUS | Status: AC | PRN
  Administered 2017-12-16: 100 mL via INTRAVENOUS

## 2017-12-16 MED ORDER — LOPERAMIDE HCL 2 MG PO CAPS: 2.0000 mg | ORAL_CAPSULE | Freq: Four times a day (QID) | ORAL | 0 refills | Status: DC | PRN
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
## 2017-12-16 NOTE — ED Triage Notes (Signed)
Pt brought in by family member due to continued nausea. Pt started having diarrhea last night approx 3 loose stools. Pt vomited x 2 this morning. Seen on 12/12/17 for the same issue. Pt feels weak

## 2017-12-16 NOTE — ED Notes (Signed)
Patient given water to drink at this time.  

## 2017-12-16 NOTE — ED Notes (Signed)
Pt returned from xray

## 2017-12-16 NOTE — Discharge Instructions (Signed)
As we discussed, we believe her symptoms are caused today by mild volume depletion, or mild dehydration, without any evidence of damage to your body.  Please drink plenty of clear fluids such as water and/or Gatorade and follow up with your regular doctor or the doctors listed in his documentation at the next available opportunity.  Return to the emergency department with any new or worsening symptoms that concern you, including but not limited to fever, shortness of breath, chest pain, or other concerning symptoms. ° ° °Dehydration, Adult °Dehydration is when you lose more fluids from the body than you take in. Vital organs like the kidneys, brain, and heart cannot function without a proper amount of fluids and salt. Any loss of fluids from the body can cause dehydration.  °CAUSES  °Vomiting. °Diarrhea. °Excessive sweating. °Excessive urine output. °Fever. °SYMPTOMS  °Mild dehydration °Thirst. °Dry lips. °Slightly dry mouth. °Moderate dehydration °Very dry mouth. °Sunken eyes. °Skin does not bounce back quickly when lightly pinched and released. °Dark urine and decreased urine production. °Decreased tear production. °Headache. °Severe dehydration °Very dry mouth. °Extreme thirst. °Rapid, weak pulse (more than 100 beats per minute at rest). °Cold hands and feet. °Not able to sweat in spite of heat and temperature. °Rapid breathing. °Blue lips. °Confusion and lethargy. °Difficulty being awakened. °Minimal urine production. °No tears. °DIAGNOSIS  °Your caregiver will diagnose dehydration based on your symptoms and your exam. Blood and urine tests will help confirm the diagnosis. The diagnostic evaluation should also identify the cause of dehydration. °TREATMENT  °Treatment of mild or moderate dehydration can often be done at home by increasing the amount of fluids that you drink. It is best to drink small amounts of fluid more often. Drinking too much at one time can make vomiting worse. Refer to the home care  instructions below. °Severe dehydration needs to be treated at the hospital where you will probably be given intravenous (IV) fluids that contain water and electrolytes. °HOME CARE INSTRUCTIONS  °Ask your caregiver about specific rehydration instructions. °Drink enough fluids to keep your urine clear or pale yellow. °Drink small amounts frequently if you have nausea and vomiting. °Eat as you normally do. °Avoid: °Foods or drinks high in sugar. °Carbonated drinks. °Juice. °Extremely hot or cold fluids. °Drinks with caffeine. °Fatty, greasy foods. °Alcohol. °Tobacco. °Overeating. °Gelatin desserts. °Wash your hands well to avoid spreading bacteria and viruses. °Only take over-the-counter or prescription medicines for pain, discomfort, or fever as directed by your caregiver. °Ask your caregiver if you should continue all prescribed and over-the-counter medicines. °Keep all follow-up appointments with your caregiver. °SEEK MEDICAL CARE IF: °You have abdominal pain and it increases or stays in one area (localizes). °You have a rash, stiff neck, or severe headache. °You are irritable, sleepy, or difficult to awaken. °You are weak, dizzy, or extremely thirsty. °SEEK IMMEDIATE MEDICAL CARE IF:  °You are unable to keep fluids down or you get worse despite treatment. °You have frequent episodes of vomiting or diarrhea. °You have blood or green matter (bile) in your vomit. °You have blood in your stool or your stool looks black and tarry. °You have not urinated in 6 to 8 hours, or you have only urinated a small amount of very dark urine. °You have a fever. °You faint. °MAKE SURE YOU:  °Understand these instructions. °Will watch your condition. °Will get help right away if you are not doing well or get worse. °Document Released: 09/06/2005 Document Revised: 11/29/2011 Document Reviewed: 04/26/2011 °ExitCare® Patient Information ©2015   [employer]. This information is not intended to replace advice given to you by your health  care provider. Make sure you discuss any questions you have with your health care provider. ° °Rehydration, Adult °Rehydration is the replacement of body fluids lost during dehydration. Dehydration is an extreme loss of body fluids to the point of body function impairment. There are many ways extreme fluid loss can occur, including vomiting, diarrhea, or excess sweating. Recovering from dehydration requires replacing lost fluids, continuing to eat to maintain strength, and avoiding foods and beverages that may contribute to further fluid loss or may increase nausea. °HOW TO REHYDRATE °In most cases, rehydration involves the replacement of not only fluids but also carbohydrates and basic body salts. Rehydration with an oral rehydration solution is one way to replace essential nutrients lost through dehydration. °An oral rehydration solution can be purchased at pharmacies, retail stores, and online. Premixed packets of powder that you combine with water to make a solution are also sold. You can prepare an oral rehydration solution at home by mixing the following ingredients together:  ° - tsp table salt. °¾ tsp baking soda. ° tsp salt substitute containing potassium chloride. °1 tablespoons sugar. °1 L (34 oz) of water. °Be sure to use exact measurements. Including too much sugar can make diarrhea worse. °Drink ½-1 cup (120-240 mL) of oral rehydration solution each time you have diarrhea or vomit. If drinking this amount makes your vomiting worse, try drinking smaller amounts more often. For example, drink 1-3 tsp every 5-10 minutes.  °A general rule for staying hydrated is to drink 1½-2 L of fluid per day. Talk to your caregiver about the specific amount you should be drinking each day. Drink enough fluids to keep your urine clear or pale yellow. °EATING WHEN DEHYDRATED °Even if you have had severe sweating or you are having diarrhea, do not stop eating. Many healthy items in a normal diet are okay to continue eating  while recovering from dehydration. The following tips can help you to lessen nausea when you eat: °Ask someone else to prepare your food. Cooking smells may worsen nausea. °Eat in a well-ventilated room away from cooking smells. °Sit up when you eat. Avoid lying down until 1-2 hours after eating. °Eat small amounts when you eat. °Eat foods that are easy to digest. These include soft, well-cooked, or mashed foods. °FOODS AND BEVERAGES TO AVOID °Avoid eating or drinking the following foods and beverages that may increase nausea or further loss of fluid:  °Fruit juices with a high sugar content, such as concentrated juices. °Alcohol. °Beverages containing caffeine. °Carbonated drinks. They may cause a lot of gas. °Foods that may cause a lot of gas, such as cabbage, broccoli, and beans. °Fatty, greasy, and fried foods. °Spicy, very salty, and very sweet foods or drinks. °Foods or drinks that are very hot or very cold. Consume food or drinks at or near room temperature. °Foods that need a lot of chewing, such as raw vegetables. °Foods that are sticky or hard to swallow, such as peanut butter. °Document Released: 11/29/2011 Document Revised: 05/31/2012 Document Reviewed: 11/29/2011 °ExitCare® Patient Information ©2015 [employer]. This information is not intended to replace advice given to you by your health care provider. Make sure you discuss any questions you have with your health care provider. ° ° ° °

## 2017-12-16 NOTE — ED Notes (Signed)
Patient states that BP has been elevated today. Patient states that he is not having any pain at this time.

## 2017-12-22 DIAGNOSIS — R109 Unspecified abdominal pain: Secondary | ICD-10-CM | POA: Diagnosis not present

## 2017-12-22 DIAGNOSIS — Z6828 Body mass index (BMI) 28.0-28.9, adult: Secondary | ICD-10-CM | POA: Diagnosis not present

## 2017-12-22 DIAGNOSIS — E663 Overweight: Secondary | ICD-10-CM | POA: Diagnosis not present

## 2017-12-23 ENCOUNTER — Emergency Department (HOSPITAL_COMMUNITY)
Admission: EM | Admit: 2017-12-23 | Discharge: 2017-12-23 | Disposition: A | Discharge status: Home / Self Care | Payer: Medicare Other | Attending: Emergency Medicine | Admitting: Emergency Medicine

## 2017-12-23 ENCOUNTER — Encounter (HOSPITAL_COMMUNITY): Payer: Self-pay | Admitting: Emergency Medicine

## 2017-12-23 ENCOUNTER — Other Ambulatory Visit: Payer: Self-pay

## 2017-12-23 ENCOUNTER — Emergency Department (HOSPITAL_COMMUNITY): Payer: Medicare Other

## 2017-12-23 DIAGNOSIS — Z7982 Long term (current) use of aspirin: Secondary | ICD-10-CM | POA: Insufficient documentation

## 2017-12-23 DIAGNOSIS — R1013 Epigastric pain: Secondary | ICD-10-CM | POA: Diagnosis not present

## 2017-12-23 DIAGNOSIS — R197 Diarrhea, unspecified: Secondary | ICD-10-CM | POA: Diagnosis not present

## 2017-12-23 DIAGNOSIS — I10 Essential (primary) hypertension: Secondary | ICD-10-CM | POA: Insufficient documentation

## 2017-12-23 DIAGNOSIS — Z79899 Other long term (current) drug therapy: Secondary | ICD-10-CM | POA: Diagnosis not present

## 2017-12-23 DIAGNOSIS — R1030 Lower abdominal pain, unspecified: Secondary | ICD-10-CM | POA: Diagnosis present

## 2017-12-23 LAB — DIFFERENTIAL
Basophils Absolute: 0 10*3/uL (ref 0.0–0.1)
Basophils Relative: 0 %
Eosinophils Absolute: 0.2 10*3/uL (ref 0.0–0.7)
Eosinophils Relative: 3 %
LYMPHS PCT: 33 %
Lymphs Abs: 2.4 10*3/uL (ref 0.7–4.0)
MONO ABS: 0.5 10*3/uL (ref 0.1–1.0)
Monocytes Relative: 7 %
NEUTROS ABS: 4 10*3/uL (ref 1.7–7.7)
NEUTROS PCT: 57 %

## 2017-12-23 LAB — COMPREHENSIVE METABOLIC PANEL
ALT: 31 U/L (ref 17–63)
ANION GAP: 11 (ref 5–15)
AST: 45 U/L — ABNORMAL HIGH (ref 15–41)
Albumin: 3.9 g/dL (ref 3.5–5.0)
Alkaline Phosphatase: 76 U/L (ref 38–126)
BUN: 13 mg/dL (ref 6–20)
CHLORIDE: 106 mmol/L (ref 101–111)
CO2: 21 mmol/L — ABNORMAL LOW (ref 22–32)
CREATININE: 1.12 mg/dL (ref 0.61–1.24)
Calcium: 9.8 mg/dL (ref 8.9–10.3)
GFR calc non Af Amer: >60 mL/min (ref >60)
Glucose, Bld: 113 mg/dL — ABNORMAL HIGH (ref 65–99)
POTASSIUM: 4.4 mmol/L (ref 3.5–5.1)
Sodium: 138 mmol/L (ref 135–145)
Total Bilirubin: 0.4 mg/dL (ref 0.3–1.2)
Total Protein: 7.6 g/dL (ref 6.5–8.1)

## 2017-12-23 LAB — URINALYSIS, ROUTINE W REFLEX MICROSCOPIC
Bilirubin Urine: NEGATIVE
Glucose, UA: NEGATIVE mg/dL
Hgb urine dipstick: NEGATIVE
Ketones, ur: NEGATIVE mg/dL
LEUKOCYTES UA: NEGATIVE
Nitrite: NEGATIVE
PROTEIN: NEGATIVE mg/dL
Specific Gravity, Urine: 1.023 (ref 1.005–1.030)
pH: 6 (ref 5.0–8.0)

## 2017-12-23 LAB — CBC
HCT: 40.5 % (ref 39.0–52.0)
HEMOGLOBIN: 13 g/dL (ref 13.0–17.0)
MCH: 29.9 pg (ref 26.0–34.0)
MCHC: 32.1 g/dL (ref 30.0–36.0)
MCV: 93.1 fL (ref 78.0–100.0)
PLATELETS: 346 10*3/uL (ref 150–400)
RBC: 4.35 MIL/uL (ref 4.22–5.81)
RDW: 14.1 % (ref 11.5–15.5)
WBC: 7 10*3/uL (ref 4.0–10.5)

## 2017-12-23 LAB — LIPASE, BLOOD: LIPASE: 31 U/L (ref 11–51)

## 2017-12-23 MED ORDER — DICYCLOMINE HCL 20 MG PO TABS: ORAL_TABLET | ORAL | 0 refills | Status: DC

## 2017-12-23 MED ORDER — DICYCLOMINE HCL 10 MG PO CAPS
20.0000 mg | ORAL_CAPSULE | Freq: Once | ORAL | Status: AC
  Administered 2017-12-23: 20 mg via ORAL
  Filled 2017-12-23: qty 2

## 2017-12-23 NOTE — Discharge Instructions (Addendum)
Call the GI doctor Monday to get seen this week.

## 2017-12-23 NOTE — ED Triage Notes (Signed)
Pt c/o generalized abdominal pain with intermittent n/v/d x 2 weeks. Denies fever.
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
## 2017-12-30 DIAGNOSIS — Z6828 Body mass index (BMI) 28.0-28.9, adult: Secondary | ICD-10-CM | POA: Diagnosis not present

## 2017-12-30 DIAGNOSIS — R197 Diarrhea, unspecified: Secondary | ICD-10-CM | POA: Diagnosis not present

## 2017-12-30 DIAGNOSIS — K219 Gastro-esophageal reflux disease without esophagitis: Secondary | ICD-10-CM | POA: Diagnosis not present

## 2017-12-30 DIAGNOSIS — K5900 Constipation, unspecified: Secondary | ICD-10-CM | POA: Diagnosis not present

## 2018-01-01 ENCOUNTER — Emergency Department (HOSPITAL_COMMUNITY): Payer: Medicare Other

## 2018-01-01 ENCOUNTER — Other Ambulatory Visit: Payer: Self-pay

## 2018-01-01 ENCOUNTER — Encounter (HOSPITAL_COMMUNITY): Payer: Self-pay | Admitting: Emergency Medicine

## 2018-01-01 ENCOUNTER — Inpatient Hospital Stay (HOSPITAL_COMMUNITY)
Admission: EM | Admit: 2018-01-01 | Discharge: 2018-01-10 | DRG: 330 | Disposition: A | Discharge status: Home / Self Care | Payer: Medicare Other | Attending: General Surgery | Admitting: General Surgery

## 2018-01-01 DIAGNOSIS — Z823 Family history of stroke: Secondary | ICD-10-CM | POA: Diagnosis not present

## 2018-01-01 DIAGNOSIS — I10 Essential (primary) hypertension: Secondary | ICD-10-CM | POA: Diagnosis not present

## 2018-01-01 DIAGNOSIS — Z8249 Family history of ischemic heart disease and other diseases of the circulatory system: Secondary | ICD-10-CM

## 2018-01-01 DIAGNOSIS — F1011 Alcohol abuse, in remission: Secondary | ICD-10-CM | POA: Diagnosis present

## 2018-01-01 DIAGNOSIS — Z791 Long term (current) use of non-steroidal anti-inflammatories (NSAID): Secondary | ICD-10-CM | POA: Diagnosis not present

## 2018-01-01 DIAGNOSIS — Y658 Other specified misadventures during surgical and medical care: Secondary | ICD-10-CM | POA: Diagnosis not present

## 2018-01-01 DIAGNOSIS — F0390 Unspecified dementia without behavioral disturbance: Secondary | ICD-10-CM | POA: Diagnosis not present

## 2018-01-01 DIAGNOSIS — I129 Hypertensive chronic kidney disease with stage 1 through stage 4 chronic kidney disease, or unspecified chronic kidney disease: Secondary | ICD-10-CM | POA: Diagnosis present

## 2018-01-01 DIAGNOSIS — N182 Chronic kidney disease, stage 2 (mild): Secondary | ICD-10-CM

## 2018-01-01 DIAGNOSIS — Z4659 Encounter for fitting and adjustment of other gastrointestinal appliance and device: Secondary | ICD-10-CM

## 2018-01-01 DIAGNOSIS — K209 Esophagitis, unspecified: Secondary | ICD-10-CM | POA: Diagnosis not present

## 2018-01-01 DIAGNOSIS — R739 Hyperglycemia, unspecified: Secondary | ICD-10-CM | POA: Diagnosis present

## 2018-01-01 DIAGNOSIS — K56600 Partial intestinal obstruction, unspecified as to cause: Secondary | ICD-10-CM

## 2018-01-01 DIAGNOSIS — K9171 Accidental puncture and laceration of a digestive system organ or structure during a digestive system procedure: Secondary | ICD-10-CM | POA: Diagnosis not present

## 2018-01-01 DIAGNOSIS — M1990 Unspecified osteoarthritis, unspecified site: Secondary | ICD-10-CM | POA: Diagnosis present

## 2018-01-01 DIAGNOSIS — K565 Intestinal adhesions [bands], unspecified as to partial versus complete obstruction: Principal | ICD-10-CM | POA: Diagnosis present

## 2018-01-01 DIAGNOSIS — N471 Phimosis: Secondary | ICD-10-CM | POA: Diagnosis not present

## 2018-01-01 DIAGNOSIS — K2970 Gastritis, unspecified, without bleeding: Secondary | ICD-10-CM | POA: Diagnosis not present

## 2018-01-01 DIAGNOSIS — Z981 Arthrodesis status: Secondary | ICD-10-CM | POA: Diagnosis not present

## 2018-01-01 DIAGNOSIS — K259 Gastric ulcer, unspecified as acute or chronic, without hemorrhage or perforation: Secondary | ICD-10-CM | POA: Diagnosis not present

## 2018-01-01 DIAGNOSIS — K660 Peritoneal adhesions (postprocedural) (postinfection): Secondary | ICD-10-CM | POA: Diagnosis not present

## 2018-01-01 DIAGNOSIS — R531 Weakness: Secondary | ICD-10-CM | POA: Diagnosis not present

## 2018-01-01 DIAGNOSIS — K56609 Unspecified intestinal obstruction, unspecified as to partial versus complete obstruction: Secondary | ICD-10-CM

## 2018-01-01 DIAGNOSIS — R140 Abdominal distension (gaseous): Secondary | ICD-10-CM | POA: Diagnosis not present

## 2018-01-01 DIAGNOSIS — Z833 Family history of diabetes mellitus: Secondary | ICD-10-CM | POA: Diagnosis not present

## 2018-01-01 DIAGNOSIS — M4802 Spinal stenosis, cervical region: Secondary | ICD-10-CM | POA: Diagnosis present

## 2018-01-01 DIAGNOSIS — K5730 Diverticulosis of large intestine without perforation or abscess without bleeding: Secondary | ICD-10-CM | POA: Diagnosis not present

## 2018-01-01 DIAGNOSIS — R42 Dizziness and giddiness: Secondary | ICD-10-CM | POA: Diagnosis not present

## 2018-01-01 DIAGNOSIS — Z7982 Long term (current) use of aspirin: Secondary | ICD-10-CM

## 2018-01-01 DIAGNOSIS — R933 Abnormal findings on diagnostic imaging of other parts of digestive tract: Secondary | ICD-10-CM | POA: Diagnosis not present

## 2018-01-01 DIAGNOSIS — Z4682 Encounter for fitting and adjustment of non-vascular catheter: Secondary | ICD-10-CM | POA: Diagnosis not present

## 2018-01-01 HISTORY — DX: Abdominal aortic aneurysm, without rupture, unspecified: I71.40

## 2018-01-01 HISTORY — DX: Abdominal aortic aneurysm, without rupture: I71.4

## 2018-01-01 LAB — CBC WITH DIFFERENTIAL/PLATELET
Basophils Absolute: 0 10*3/uL (ref 0.0–0.1)
Basophils Relative: 0 %
Eosinophils Absolute: 0.2 10*3/uL (ref 0.0–0.7)
Eosinophils Relative: 3 %
HEMATOCRIT: 40 % (ref 39.0–52.0)
HEMOGLOBIN: 13.1 g/dL (ref 13.0–17.0)
LYMPHS ABS: 3.3 10*3/uL (ref 0.7–4.0)
LYMPHS PCT: 35 %
MCH: 30.9 pg (ref 26.0–34.0)
MCHC: 32.8 g/dL (ref 30.0–36.0)
MCV: 94.3 fL (ref 78.0–100.0)
MONOS PCT: 5 %
Monocytes Absolute: 0.5 10*3/uL (ref 0.1–1.0)
NEUTROS ABS: 5.3 10*3/uL (ref 1.7–7.7)
NEUTROS PCT: 57 %
Platelets: 361 10*3/uL (ref 150–400)
RBC: 4.24 MIL/uL (ref 4.22–5.81)
RDW: 14.3 % (ref 11.5–15.5)
WBC: 9.3 10*3/uL (ref 4.0–10.5)

## 2018-01-01 LAB — COMPREHENSIVE METABOLIC PANEL
ALBUMIN: 3.9 g/dL (ref 3.5–5.0)
ALK PHOS: 84 U/L (ref 38–126)
ALT: 24 U/L (ref 17–63)
ANION GAP: 11 (ref 5–15)
AST: 31 U/L (ref 15–41)
BILIRUBIN TOTAL: 0.3 mg/dL (ref 0.3–1.2)
BUN: 16 mg/dL (ref 6–20)
CALCIUM: 9.6 mg/dL (ref 8.9–10.3)
CO2: 21 mmol/L — ABNORMAL LOW (ref 22–32)
Chloride: 112 mmol/L — ABNORMAL HIGH (ref 101–111)
Creatinine, Ser: 1.2 mg/dL (ref 0.61–1.24)
GFR calc non Af Amer: 60 mL/min — ABNORMAL LOW (ref >60)
GLUCOSE: 135 mg/dL — AB (ref 65–99)
Potassium: 4 mmol/L (ref 3.5–5.1)
Sodium: 144 mmol/L (ref 135–145)
TOTAL PROTEIN: 7.9 g/dL (ref 6.5–8.1)

## 2018-01-01 LAB — URINALYSIS, ROUTINE W REFLEX MICROSCOPIC
BILIRUBIN URINE: NEGATIVE
Glucose, UA: NEGATIVE mg/dL
HGB URINE DIPSTICK: NEGATIVE
KETONES UR: NEGATIVE mg/dL
Leukocytes, UA: NEGATIVE
NITRITE: NEGATIVE
Protein, ur: NEGATIVE mg/dL
Specific Gravity, Urine: 1.025 (ref 1.005–1.030)
pH: 5 (ref 5.0–8.0)

## 2018-01-01 LAB — LIPASE, BLOOD: Lipase: 27 U/L (ref 11–51)

## 2018-01-01 MED ORDER — PANTOPRAZOLE SODIUM 40 MG IV SOLR
40.0000 mg | INTRAVENOUS | Status: DC
  Administered 2018-01-01 – 2018-01-09 (×9): 40 mg via INTRAVENOUS
  Filled 2018-01-01 (×9): qty 40

## 2018-01-01 MED ORDER — SODIUM CHLORIDE 0.9 % IV BOLUS
500.0000 mL | Freq: Once | INTRAVENOUS | Status: AC
  Administered 2018-01-01: 500 mL via INTRAVENOUS

## 2018-01-01 MED ORDER — ACETAMINOPHEN 325 MG PO TABS
650.0000 mg | ORAL_TABLET | Freq: Four times a day (QID) | ORAL | Status: DC | PRN
Start: 2018-01-01 — End: 2018-01-10
  Administered 2018-01-07: 650 mg via ORAL
  Filled 2018-01-01: qty 2

## 2018-01-01 MED ORDER — ACETAMINOPHEN 650 MG RE SUPP
650.0000 mg | Freq: Four times a day (QID) | RECTAL | Status: DC | PRN
  Administered 2018-01-07: 650 mg via RECTAL
  Filled 2018-01-01: qty 1

## 2018-01-01 MED ORDER — IOPAMIDOL (ISOVUE-300) INJECTION 61%
100.0000 mL | Freq: Once | INTRAVENOUS | Status: AC | PRN
  Administered 2018-01-01: 100 mL via INTRAVENOUS

## 2018-01-01 MED ORDER — ONDANSETRON HCL 4 MG PO TABS: 4.0000 mg | ORAL_TABLET | Freq: Four times a day (QID) | ORAL | Status: DC | PRN

## 2018-01-01 MED ORDER — ONDANSETRON HCL 4 MG/2ML IJ SOLN
4.0000 mg | Freq: Once | INTRAMUSCULAR | Status: AC
  Administered 2018-01-01: 4 mg via INTRAVENOUS
  Filled 2018-01-01: qty 2

## 2018-01-01 MED ORDER — IOPAMIDOL (ISOVUE-300) INJECTION 61%
30.0000 mL | Freq: Once | INTRAVENOUS | Status: AC | PRN
  Administered 2018-01-01: 30 mL via ORAL

## 2018-01-01 MED ORDER — MORPHINE SULFATE (PF) 4 MG/ML IV SOLN
4.0000 mg | Freq: Once | INTRAVENOUS | Status: AC
  Administered 2018-01-01: 4 mg via INTRAVENOUS
  Filled 2018-01-01: qty 1

## 2018-01-01 MED ORDER — ONDANSETRON HCL 4 MG/2ML IJ SOLN
4.0000 mg | Freq: Four times a day (QID) | INTRAMUSCULAR | Status: DC | PRN
  Administered 2018-01-05: 4 mg via INTRAVENOUS

## 2018-01-01 MED ORDER — ORAL CARE MOUTH RINSE
15.0000 mL | Freq: Two times a day (BID) | OROMUCOSAL | Status: DC
  Administered 2018-01-02 – 2018-01-09 (×13): 15 mL via OROMUCOSAL

## 2018-01-01 MED ORDER — POTASSIUM CHLORIDE IN NACL 20-0.9 MEQ/L-% IV SOLN
INTRAVENOUS | Status: DC
  Administered 2018-01-01 – 2018-01-04 (×4): via INTRAVENOUS

## 2018-01-01 MED ORDER — MORPHINE SULFATE (PF) 2 MG/ML IV SOLN
2.0000 mg | INTRAVENOUS | Status: DC | PRN
  Administered 2018-01-05 – 2018-01-08 (×17): 2 mg via INTRAVENOUS
  Filled 2018-01-01 (×17): qty 1
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
## 2018-01-01 NOTE — ED Notes (Signed)
Patient unable to void at this time

## 2018-01-01 NOTE — ED Notes (Signed)
Checked on pt for urine sample,pt states he can't go right now,will check back in 30 minutes.

## 2018-01-01 NOTE — ED Triage Notes (Signed)
atient c/o right side abd pain with diarrhea. Denies any nausea, vomiting, fevers, or urinary symptoms. Per family patient has had pain x1 month but pain got progressively worse today. Patient scheduled to see GI specialist May 17th. Patient seen by PCP for blood in stool on Friday. Denies having blood work. Denies any blood in stool since he seen PCP.

## 2018-01-01 NOTE — ED Notes (Signed)
Attempted to call report to [phone] RN.

## 2018-01-02 ENCOUNTER — Encounter (HOSPITAL_COMMUNITY): Payer: Self-pay | Admitting: Internal Medicine

## 2018-01-02 ENCOUNTER — Inpatient Hospital Stay (HOSPITAL_COMMUNITY): Payer: Medicare Other

## 2018-01-02 ENCOUNTER — Other Ambulatory Visit: Payer: Self-pay

## 2018-01-02 DIAGNOSIS — K56609 Unspecified intestinal obstruction, unspecified as to partial versus complete obstruction: Secondary | ICD-10-CM

## 2018-01-02 DIAGNOSIS — F0390 Unspecified dementia without behavioral disturbance: Secondary | ICD-10-CM

## 2018-01-02 DIAGNOSIS — M4802 Spinal stenosis, cervical region: Secondary | ICD-10-CM

## 2018-01-02 DIAGNOSIS — I10 Essential (primary) hypertension: Secondary | ICD-10-CM

## 2018-01-02 DIAGNOSIS — R739 Hyperglycemia, unspecified: Secondary | ICD-10-CM

## 2018-01-02 DIAGNOSIS — K56600 Partial intestinal obstruction, unspecified as to cause: Secondary | ICD-10-CM

## 2018-01-02 DIAGNOSIS — N182 Chronic kidney disease, stage 2 (mild): Secondary | ICD-10-CM

## 2018-01-02 LAB — GLUCOSE, CAPILLARY
GLUCOSE-CAPILLARY: 82 mg/dL (ref 65–99)
GLUCOSE-CAPILLARY: 87 mg/dL (ref 65–99)
Glucose-Capillary: 94 mg/dL (ref 65–99)

## 2018-01-02 LAB — BASIC METABOLIC PANEL
ANION GAP: 7 (ref 5–15)
BUN: 12 mg/dL (ref 6–20)
CHLORIDE: 110 mmol/L (ref 101–111)
CO2: 22 mmol/L (ref 22–32)
CREATININE: 1.04 mg/dL (ref 0.61–1.24)
Calcium: 8.4 mg/dL — ABNORMAL LOW (ref 8.9–10.3)
GFR calc non Af Amer: >60 mL/min (ref >60)
Glucose, Bld: 98 mg/dL (ref 65–99)
Potassium: 3.8 mmol/L (ref 3.5–5.1)
Sodium: 139 mmol/L (ref 135–145)

## 2018-01-02 LAB — CBC
HEMATOCRIT: 36 % — AB (ref 39.0–52.0)
HEMOGLOBIN: 11.7 g/dL — AB (ref 13.0–17.0)
MCH: 30.5 pg (ref 26.0–34.0)
MCHC: 32.5 g/dL (ref 30.0–36.0)
MCV: 93.8 fL (ref 78.0–100.0)
Platelets: 326 10*3/uL (ref 150–400)
RBC: 3.84 MIL/uL — ABNORMAL LOW (ref 4.22–5.81)
RDW: 14.1 % (ref 11.5–15.5)
WBC: 7.9 10*3/uL (ref 4.0–10.5)

## 2018-01-02 LAB — HEMOGLOBIN A1C
HEMOGLOBIN A1C: 5.3 % (ref 4.8–5.6)
Mean Plasma Glucose: 105.41 mg/dL

## 2018-01-02 LAB — PHOSPHORUS: Phosphorus: 2.9 mg/dL (ref 2.5–4.6)

## 2018-01-02 LAB — MAGNESIUM: MAGNESIUM: 1.5 mg/dL — AB (ref 1.7–2.4)

## 2018-01-02 MED ORDER — METOPROLOL TARTRATE 5 MG/5ML IV SOLN
2.5000 mg | Freq: Three times a day (TID) | INTRAVENOUS | Status: DC
  Administered 2018-01-02 – 2018-01-09 (×20): 2.5 mg via INTRAVENOUS
  Filled 2018-01-02 (×21): qty 5

## 2018-01-02 MED ORDER — POTASSIUM PHOSPHATES 15 MMOLE/5ML IV SOLN
20.0000 meq | Freq: Once | INTRAVENOUS | Status: AC
  Administered 2018-01-02: 20 meq via INTRAVENOUS
  Filled 2018-01-02: qty 4.55

## 2018-01-02 MED ORDER — MECLIZINE HCL 12.5 MG PO TABS
25.0000 mg | ORAL_TABLET | Freq: Three times a day (TID) | ORAL | Status: DC | PRN
  Administered 2018-01-07 – 2018-01-08 (×3): 25 mg via ORAL
  Filled 2018-01-02 (×3): qty 2

## 2018-01-02 MED ORDER — MAGNESIUM SULFATE 4 GM/100ML IV SOLN
4.0000 g | Freq: Once | INTRAVENOUS | Status: AC
  Administered 2018-01-02: 4 g via INTRAVENOUS
  Filled 2018-01-02: qty 100
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
## 2018-01-02 NOTE — Progress Notes (Signed)
PROGRESS NOTE  Thomas [name] ZOX:[id number] DOB: 15-May-1951 DOA: 01/01/2018 PCP: Shawnie Dapper, PA-C  Brief History:  67 year old male with a history of hypertension, cognitive impairment, and spinal stenosis presenting with 3-week history of abdominal pain that worsened in the past 2 days.  The patient had had 2 previous emergency department visits on 12/12/17 and 12/16/17 for diarrhea and abdominal pain.  Abdominal ultrasound 12/12/2017 showed no acute findings except hepatic steatosis.  CT abdomen and pelvis on 12/16/2017 showed multiple air-fluid levels in the stomach, small bowel, colon consistent with possible enterocolitis.  The patient was discharged home from the emergency department with symptomatic treatment.  He denies any fevers, chills, vomiting, sick contacts, eating unusual or undercooked foods, travels.  He states that he has had on and off loose stools for the better part of the last 3 weeks with one episode of hematochezia on 12/22/2017.  He has had bowel movements since then without any additional hematochezia.  Because of worsening abdominal pain and distention he presented to the emergency department.  414 CT abdomen showed small bowel dilatation up to 4.2 cm with a transition zone in the RLQ, mid ileum consistent with small bowel obstruction.  There is a suture line the mid distal ileum that was unremarkable.  NG tube was placed for decompression.  BMP, CBC, and LFTs were essentially unremarkable.  Assessment/Plan: Small bowel obstruction -Status post NG tube placement -Appears to be clinically improving -Remain n.p.o. -General surgery has been consulted -Continue IV fluids  Pyloric wall thickening -01/01/2018 CT abdomen pelvis--pyloric wall thickening concerning for mass or ulcer. -GI consult  Diarrhea -no BM since admission  CKD stage II -Baseline creatinine 1.0-1.2 -A.m. BMP  Essential hypertension -Continue Lopressor IV until able to tolerate po -holding  amlodipine and bisoprolol due to SBO  Dementia without behavioral disturbance -10/27/17 MMSE 21/30 -Continue Aricept when able to tolerate po -continue remeron at hs when able to tolerate po  Alcohol abuse in remission -Has not drank alcohol in 1 year    Disposition Plan:   Home in 2-3 days  Family Communication:   No Family at bedside  Consultants:  General surgery, GI  Code Status:  FULL  DVT Prophylaxis:  SCDs   Procedures: As Listed in Progress Note Above  Antibiotics: None    Subjective: Patient is feeling better.  He has not had any emesis.  He states his abdominal pain is improving.  He has not passed any flatus or had a bowel movement.  He denies any fevers, chills, chest pain, shortness breath, nausea, dysuria, hematuria.  There is no headache or neck pain.  Objective: Vitals:   01/01/18 2230 01/01/18 2325 01/02/18 0005 01/02/18 0538  BP: (!) 148/97 (!) 154/90 (!) 145/98 114/86  Pulse: 61 87 80 (!) 52  Resp: 20 16  18   Temp:  98.3 F (36.8 C)  98.2 F (36.8 C)  TempSrc:  Oral  Oral  SpO2: 98% 99% 97% 98%  Weight:  82.7 kg (182 lb 5.1 oz)    Height:  5\' 8"  (1.727 m)      Intake/Output Summary (Last 24 hours) at 01/02/2018 8119 Last data filed at 01/02/2018 0600 Gross per 24 hour  Intake 1150 ml  Output 100 ml  Net 1050 ml   Weight change:  Exam:   General:  Pt is alert, follows commands appropriately, not in acute distress  HEENT: No icterus, No thrush, No neck mass, [place]/AT  Cardiovascular: RRR, S1/S2, no rubs, no gallops  Respiratory: CTA bilaterally, no wheezing, no crackles, no rhonchi  Abdomen: Soft/+BS, non tender, non distended, no guarding  Extremities: No edema, No lymphangitis, No petechiae, No rashes, no synovitis   Data Reviewed: I have personally reviewed following labs and imaging studies Basic Metabolic Panel: Recent Labs  Lab 01/01/18 1830  NA 144  K 4.0  CL 112*  CO2 21*  GLUCOSE 135*  BUN 16  CREATININE 1.20    CALCIUM 9.6   Liver Function Tests: Recent Labs  Lab 01/01/18 1830  AST 31  ALT 24  ALKPHOS 84  BILITOT 0.3  PROT 7.9  ALBUMIN 3.9   Recent Labs  Lab 01/01/18 1830  LIPASE 27   No results for input(s): AMMONIA in the last 168 hours. Coagulation Profile: No results for input(s): INR, PROTIME in the last 168 hours. CBC: Recent Labs  Lab 01/01/18 1830  WBC 9.3  NEUTROABS 5.3  HGB 13.1  HCT 40.0  MCV 94.3  PLT 361   Cardiac Enzymes: No results for input(s): CKTOTAL, CKMB, CKMBINDEX, TROPONINI in the last 168 hours. BNP: Invalid input(s): POCBNP CBG: Recent Labs  Lab 01/02/18 0602  GLUCAP 82   HbA1C: No results for input(s): HGBA1C in the last 72 hours. Urine analysis:    Component Value Date/Time   COLORURINE YELLOW 01/01/2018 2000   APPEARANCEUR CLEAR 01/01/2018 2000   LABSPEC 1.025 01/01/2018 2000   PHURINE 5.0 01/01/2018 2000   GLUCOSEU NEGATIVE 01/01/2018 2000   HGBUR NEGATIVE 01/01/2018 2000   BILIRUBINUR NEGATIVE 01/01/2018 2000   KETONESUR NEGATIVE 01/01/2018 2000   PROTEINUR NEGATIVE 01/01/2018 2000   UROBILINOGEN 4.0 (H) 05/22/2010 1919   NITRITE NEGATIVE 01/01/2018 2000   LEUKOCYTESUR NEGATIVE 01/01/2018 2000   Sepsis Labs: @LABRCNTIP (procalcitonin:4,lacticidven:4) )No results found for this or any previous visit (from the past 240 hour(s)).   Scheduled Meds: . mouth rinse  15 mL Mouth Rinse BID  . metoprolol tartrate  2.5 mg Intravenous Q8H  . pantoprazole (PROTONIX) IV  40 mg Intravenous Q24H   Continuous Infusions: . 0.9 % NaCl with KCl 20 mEq / L 100 mL/hr at 01/01/18 2330    Procedures/Studies: Dg Abd 1 View  Result Date: 01/02/2018 CLINICAL DATA:  Nasogastric tube placement. EXAM: ABDOMEN - 1 VIEW COMPARISON:  CT of the abdomen and pelvis performed 01/01/2018 FINDINGS: The patient's enteric tube is noted ending overlying the body of the stomach. The visualized bowel gas pattern is unremarkable. Scattered air and stool filled  loops of colon are seen; no abnormal dilatation of small bowel loops is seen to suggest small bowel obstruction. No free intra-abdominal air is identified, though evaluation for free air is limited on a single supine view. The visualized osseous structures are within normal limits; the sacroiliac joints are unremarkable in appearance. The visualized lung bases are essentially clear. IMPRESSION: Enteric tube noted ending overlying the body of the stomach. These results were called by telephone at the time of interpretation on 01/02/2018 at 12:35 am to [facility], who verbally acknowledged these results. Electronically Signed   By: Roanna RaiderJeffery  Chang M.D.   On: 01/02/2018 00:36   Ct Abdomen Pelvis W Contrast  Result Date: 01/01/2018 CLINICAL DATA:  Chronic right-sided abdominal pain and diarrhea. Hematochezia. EXAM: CT ABDOMEN AND PELVIS WITH CONTRAST TECHNIQUE: Multidetector CT imaging of the abdomen and pelvis was performed using the standard protocol following bolus administration of intravenous contrast. CONTRAST:  100mL ISOVUE-300 IOPAMIDOL (ISOVUE-300) INJECTION 61% COMPARISON:  CT of the abdomen and pelvis from 12/16/2017 FINDINGS: Lower chest: The visualized lung bases are grossly clear. The visualized portions of the mediastinum are unremarkable. Hepatobiliary: A stable 9 mm hypodensity is noted at the hepatic dome. The liver is otherwise unremarkable in appearance. The gallbladder is within normal limits. The common bile duct is normal in caliber. Pancreas: The pancreas is within normal limits. Spleen: The spleen is unremarkable in appearance. Adrenals/Urinary Tract: The adrenal glands are unremarkable in appearance. The kidneys are within normal limits. There is no evidence of hydronephrosis. No renal or ureteral stones are identified. No perinephric stranding is seen. Stomach/Bowel: There is dilatation of small-bowel loops to 4.2 cm in maximal diameter, with gradual dilution of contrast. A  transition point is noted at the right lower quadrant, about the mid ileum. This is compatible with relatively high-grade small bowel obstruction, likely secondary to an adhesion. A bowel suture line is noted at the mid to distal ileum, unremarkable in appearance. There is apparent wall thickening at the pylorus. This is somewhat better characterized than on the prior study. Endoscopy would be helpful for further evaluation, to exclude underlying mass or ulceration. There is question of underlying mildly prominent nodes, measuring up to 8 mm in short axis. The appendix is normal in caliber, without evidence of appendicitis. Minimal diverticulosis is noted about much of the colon. The colon is otherwise unremarkable in appearance. Vascular/Lymphatic: The abdominal aorta is unremarkable in appearance. The inferior vena cava is grossly unremarkable. No retroperitoneal lymphadenopathy is seen. No pelvic sidewall lymphadenopathy is identified. Reproductive: The bladder is mildly distended and grossly unremarkable. The prostate is enlarged, measuring 6.5 cm in transverse dimension, with minimal calcification. Other: No additional soft tissue abnormalities are seen. Musculoskeletal: No acute osseous abnormalities are identified. The visualized musculature is unremarkable in appearance. IMPRESSION: 1. Dilatation of small-bowel loops to 4.2 cm in maximal diameter, with transition point at the right lower quadrant, about the mid ileum. This is compatible with relatively high-grade small bowel obstruction, likely secondary to an adhesion. 2. Apparent wall thickening at the pylorus. This is somewhat better characterized than on the prior study. Question of underlying mildly prominent nodes, measuring up to 8 mm in short axis. Endoscopy would be helpful for further evaluation, to exclude underlying mass or ulceration. 3. Significantly enlarged prostate. 4. Minimal diverticulosis about much of the colon, without evidence of  diverticulitis. These results were called by telephone at the time of interpretation on 01/01/2018 at 9:19 pm to Dr. Loren Racer, who verbally acknowledged these results. Electronically Signed   By: Roanna Raider M.D.   On: 01/01/2018 21:22   Ct Abdomen Pelvis W Contrast  Result Date: 12/16/2017 CLINICAL DATA:  Nausea and vomiting EXAM: CT ABDOMEN AND PELVIS WITH CONTRAST TECHNIQUE: Multidetector CT imaging of the abdomen and pelvis was performed using the standard protocol following bolus administration of intravenous contrast. CONTRAST:  ISOVUE-300 IOPAMIDOL (ISOVUE-300) INJECTION 61% COMPARISON:  None. FINDINGS: Lower chest: No acute abnormality. Hepatobiliary: No focal liver abnormality is seen. No gallstones, gallbladder wall thickening, or biliary dilatation. Pancreas: Unremarkable. No pancreatic ductal dilatation or surrounding inflammatory changes. Spleen: Normal in size without focal abnormality. Adrenals/Urinary Tract: Adrenal glands are unremarkable. Kidneys are normal, without renal calculi, focal lesion, or hydronephrosis. Bladder is unremarkable. Stomach/Bowel: No bowel wall thickening. Multiple air-fluid levels in the stomach, small bowel and colon as can be seen with enterocolitis. No pneumatosis, pneumoperitoneum or portal venous gas. Vascular/Lymphatic: No significant vascular findings are present. No enlarged abdominal  or pelvic lymph nodes. Reproductive: Prostate is enlarged. Other: No abdominal wall hernia or abnormality. No abdominopelvic ascites. Musculoskeletal: No acute osseous abnormality. No aggressive osseous lesion. Degenerative disc disease disc height loss throughout the lumbar spine. IMPRESSION: 1. Multiple air-fluid levels in the stomach, small bowel and colon as can be seen with enterocolitis. Electronically Signed   By: Elige Ko   On: 12/16/2017 18:40   Dg Abd Acute W/chest  Result Date: 12/23/2017 CLINICAL DATA:  Generalized abdominal pain with intermittent  nausea, vomiting and diarrhea for 2 weeks. EXAM: DG ABDOMEN ACUTE W/ 1V CHEST COMPARISON:  Chest radiograph, 04/29/2017. Abdomen and pelvis CT, 12/16/2017. FINDINGS: Mildly distended loops of small bowel are noted with multiple air-fluid levels on the erect view. There is no free air. No evidence of renal or ureteral stones. Soft tissues are unremarkable. Heart is normal size.  No mediastinal or hilar masses.  Clear lungs. No acute skeletal abnormality. IMPRESSION: 1. Mildly distended small bowel with multiple air-fluid levels. This may reflect an adynamic ileus or low grade partial small bowel obstruction. Similar findings were present on the prior abdomen and pelvis CT. 2. No free air. 3. No active cardiopulmonary disease. Electronically Signed   By: Amie Portland M.D.   On: 12/23/2017 18:32   Dg Abdomen Acute W/chest  Result Date: 12/16/2017 CLINICAL DATA:  Abdominal pain EXAM: DG ABDOMEN ACUTE W/ 1V CHEST COMPARISON:  11/25/2017, 02/23/2017 FINDINGS: Single-view chest demonstrates no acute consolidation or effusion. Normal cardiomediastinal silhouette. No pneumothorax. Surgical changes in the cervical spine. Supine and upright views of the abdomen demonstrate no free air beneath the diaphragm. Multiple loops of dilated small bowel measuring up to 4.4 cm with multiple fluid levels on upright exam. Gas in the colon, also with fluid level. Coarse dystrophic appearing calcifications in the left groin. Phleboliths in the pelvis. IMPRESSION: 1. No radiographic evidence for acute cardiopulmonary abnormality. 2. Multiple loops of dilated small bowel with fluid levels, but with scattered colon gas present, also containing fluid level; findings could be secondary to ileus but developing small bowel obstruction is also a concern. Consider radiographic follow-up. Electronically Signed   By: Jasmine Pang M.D.   On: 12/16/2017 16:59   US Abdomen Limited Ruq  Result Date: 12/12/2017 CLINICAL DATA:  Right upper  quadrant pain and nausea. EXAM: ULTRASOUND ABDOMEN LIMITED RIGHT UPPER QUADRANT COMPARISON:  None. FINDINGS: Gallbladder: No gallstones or wall thickening visualized. No sonographic Murphy sign noted by sonographer. Common bile duct: Diameter: 3 mm, within normal limits. Liver: Diffusely increased in echogenicity. Left hepatic lobe is poorly visualized due to bowel gas. Portal vein is patent on color Doppler imaging with normal direction of blood flow towards the liver. IMPRESSION: 1. No acute findings to explain the patient's right upper quadrant pain and nausea. 2. Hepatic steatosis. Electronically Signed   By: Leanna Battles M.D.   On: 12/12/2017 09:44    Catarina Hartshorn, DO  [facility] Pager [phone]  If 7PM-7AM, please contact night-coverage [email or web address] Password [email or web address] 01/02/2018, 6:52 AM   LOS: 1 day
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
## 2018-01-03 ENCOUNTER — Inpatient Hospital Stay (HOSPITAL_COMMUNITY): Payer: Medicare Other

## 2018-01-03 LAB — CBC
HCT: 36.3 % — ABNORMAL LOW (ref 39.0–52.0)
Hemoglobin: 11.8 g/dL — ABNORMAL LOW (ref 13.0–17.0)
MCH: 30.4 pg (ref 26.0–34.0)
MCHC: 32.5 g/dL (ref 30.0–36.0)
MCV: 93.6 fL (ref 78.0–100.0)
Platelets: 334 10*3/uL (ref 150–400)
RBC: 3.88 MIL/uL — ABNORMAL LOW (ref 4.22–5.81)
RDW: 14 % (ref 11.5–15.5)
WBC: 7.2 10*3/uL (ref 4.0–10.5)

## 2018-01-03 LAB — GLUCOSE, CAPILLARY
Glucose-Capillary: 87 mg/dL (ref 65–99)
Glucose-Capillary: 90 mg/dL (ref 65–99)
Glucose-Capillary: 91 mg/dL (ref 65–99)

## 2018-01-03 LAB — BASIC METABOLIC PANEL
Anion gap: 10 (ref 5–15)
BUN: 9 mg/dL (ref 6–20)
CALCIUM: 8.6 mg/dL — AB (ref 8.9–10.3)
CO2: 20 mmol/L — AB (ref 22–32)
CREATININE: 0.99 mg/dL (ref 0.61–1.24)
Chloride: 109 mmol/L (ref 101–111)
GFR calc non Af Amer: >60 mL/min (ref >60)
Glucose, Bld: 99 mg/dL (ref 65–99)
Potassium: 3.9 mmol/L (ref 3.5–5.1)
SODIUM: 139 mmol/L (ref 135–145)

## 2018-01-03 LAB — PHOSPHORUS: PHOSPHORUS: 2.6 mg/dL (ref 2.5–4.6)

## 2018-01-03 LAB — MAGNESIUM: MAGNESIUM: 1.9 mg/dL (ref 1.7–2.4)

## 2018-01-03 MED ORDER — DEXTROSE 5 % IV SOLN
20.0000 meq | Freq: Once | INTRAVENOUS | Status: AC
  Administered 2018-01-03: 20 meq via INTRAVENOUS
  Filled 2018-01-03: qty 4.55

## 2018-01-03 MED ORDER — IOPAMIDOL (ISOVUE-300) INJECTION 61%
INTRAVENOUS | Status: AC
  Filled 2018-01-03: qty 300

## 2018-01-03 MED ORDER — IOPAMIDOL (ISOVUE-300) INJECTION 61%
INTRAVENOUS | Status: AC
  Administered 2018-01-03: 300 mL
  Filled 2018-01-03: qty 300

## 2018-01-03 MED ORDER — MAGNESIUM SULFATE 2 GM/50ML IV SOLN
2.0000 g | Freq: Once | INTRAVENOUS | Status: AC
  Administered 2018-01-03: 2 g via INTRAVENOUS
  Filled 2018-01-03: qty 50

## 2018-01-03 NOTE — Progress Notes (Signed)
[facility]  Discussed with GI. Given concern for thickened stomach, they are going to do EGD tomorrow and use as little air as possible.  Will plan for Ex lap on Thursday.    Thomas GreenhouseLindsay Veona Bittman, MD [facility] [address] [place], [place] [id number] [phone] (office)

## 2018-01-03 NOTE — Progress Notes (Signed)
Discussed with both Dr. Henreitta LeberBridges and Dr. Jena Gaussourk.   SBFT completed. Patient ultimately needs exploratory laparotomy. EGD recommended prior to this in order to rule out any occult process. Plans for EGD on 01/04/18 with conscious sedation with Dr. Jena Gaussourk. Orders placed. Surgery 01/05/18 with Dr. Henreitta LeberBridges.   Gelene MinkAnna W. Draylen Lobue, PhD, ANP-BC [facility]

## 2018-01-03 NOTE — Progress Notes (Signed)
[facility]  Patient reported to have multiple BMs downstairs and also upstairs. NG to suction. Says no pain.   Will do Xray in the AM and assess. May not have to do Ex lap if continues to have Bms.  Algis GreenhouseLindsay Morrigan Wickens, MD [facility] [address] [place], [place] [phone] (office)

## 2018-01-03 NOTE — Progress Notes (Signed)
PROGRESS NOTE  Thomas [name] ZOX:[id number] DOB: 1950-09-24 DOA: 01/01/2018 PCP: Shawnie Dapper, PA-C   Brief History:  67 year old male with a history of hypertension, cognitive impairment, and spinal stenosis presenting with 3-week history of abdominal pain that worsened in the past 2 days.  The patient had had 2 previous emergency department visits on 12/12/17 and 12/16/17 for diarrhea and abdominal pain.  Abdominal ultrasound 12/12/2017 showed no acute findings except hepatic steatosis.  CT abdomen and pelvis on 12/16/2017 showed multiple air-fluid levels in the stomach, small bowel, colon consistent with possible enterocolitis.  The patient was discharged home from the emergency department with symptomatic treatment.  He denies any fevers, chills, vomiting, sick contacts, eating unusual or undercooked foods, travels.  He states that he has had on and off loose stools for the better part of the last 3 weeks with one episode of hematochezia on 12/22/2017.  He has had bowel movements since then without any additional hematochezia.  Because of worsening abdominal pain and distention he presented to the emergency department.  414 CT abdomen showed small bowel dilatation up to 4.2 cm with a transition zone in the RLQ, mid ileum consistent with small bowel obstruction.  There is a suture line the mid distal ileum that was unremarkable.  NG tube was placed for decompression.  BMP, CBC, and LFTs were essentially unremarkable.  Assessment/Plan: Small bowel obstruction -continue NG decompression--470cc out last 24hr -had 5 BM's today -Remain n.p.o. -01/03/18 SBFT--mid ileal SBO -General surgery consult appreciated-->tentative laparotomy 01/05/18  -Continue IV fluids -case discussed with Dr. Henreitta Leber  Pyloric wall thickening -01/01/2018 CT abdomen pelvis--pyloric wall thickening concerning for mass or ulcer. -GI consult appreciated-->plans EGD 01/04/18 -continue protonix  CKD stage  II -Baseline creatinine 1.0-1.2 -A.m. BMP  Essential hypertension -Continue Lopressor IV until able to tolerate po -holding amlodipine and bisoprolol due to SBO -BP remains acceptable  Dementia without behavioral disturbance -10/27/17 MMSE 21/30 -Continue Aricept when able to tolerate po -continue remeron at hs when able to tolerate po  Alcohol abuse in remission -Has not drank alcohol in 1 year    Disposition Plan:   Home when cleared by surgery and GI  Family Communication:   No Family at bedside  Consultants:  General surgery, GI  Code Status:  FULL  DVT Prophylaxis:  SCDs   Procedures: As Listed in Progress Note Above  Antibiotics: None     Subjective: Patient had 5 bowel movements today and is passing flatus.  He states that his abdominal pain is improved.  Denies any nausea, vomiting, fevers, chills, chest pain, shortness breath, headache, neck pain.  Objective: Vitals:   01/02/18 1449 01/02/18 2238 01/03/18 0651 01/03/18 1408  BP: 122/87 121/87 108/75 131/88  Pulse: 61 63 62 72  Resp: 20 18 18 17   Temp:  97.6 F (36.4 C) 98.6 F (37 C) 98.2 F (36.8 C)  TempSrc:  Oral Oral Oral  SpO2: 99% 99% 97% 99%  Weight:      Height:        Intake/Output Summary (Last 24 hours) at 01/03/2018 1546 Last data filed at 01/03/2018 1434 Gross per 24 hour  Intake 1124.55 ml  Output 1220 ml  Net -95.45 ml   Weight change:  Exam:   General:  Pt is alert, follows commands appropriately, not in acute distress  HEENT: No icterus, No thrush, No neck mass, [place]/AT  Cardiovascular: RRR, S1/S2, no rubs, no gallops  Respiratory:  CTA bilaterally, no wheezing, no crackles, no rhonchi  Abdomen: Soft/+BS, non tender, non distended, no guarding  Extremities: No edema, No lymphangitis, No petechiae, No rashes, no synovitis   Data Reviewed: I have personally reviewed following labs and imaging studies Basic Metabolic Panel: Recent Labs  Lab 01/01/18 1830  01/02/18 0804 01/02/18 1133 01/03/18 0621  NA 144 139  --  139  K 4.0 3.8  --  3.9  CL 112* 110  --  109  CO2 21* 22  --  20*  GLUCOSE 135* 98  --  99  BUN 16 12  --  9  CREATININE 1.20 1.04  --  0.99  CALCIUM 9.6 8.4*  --  8.6*  MG  --  1.5*  --  1.9  PHOS  --   --  2.9 2.6   Liver Function Tests: Recent Labs  Lab 01/01/18 1830  AST 31  ALT 24  ALKPHOS 84  BILITOT 0.3  PROT 7.9  ALBUMIN 3.9   Recent Labs  Lab 01/01/18 1830  LIPASE 27   No results for input(s): AMMONIA in the last 168 hours. Coagulation Profile: No results for input(s): INR, PROTIME in the last 168 hours. CBC: Recent Labs  Lab 01/01/18 1830 01/02/18 0804 01/03/18 0621  WBC 9.3 7.9 7.2  NEUTROABS 5.3  --   --   HGB 13.1 11.7* 11.8*  HCT 40.0 36.0* 36.3*  MCV 94.3 93.8 93.6  PLT 361 326 334   Cardiac Enzymes: No results for input(s): CKTOTAL, CKMB, CKMBINDEX, TROPONINI in the last 168 hours. BNP: Invalid input(s): POCBNP CBG: Recent Labs  Lab 01/02/18 1332 01/02/18 1839 01/03/18 0008 01/03/18 0649 01/03/18 1416  GLUCAP 87 94 87 90 91   HbA1C: Recent Labs    01/02/18 0804  HGBA1C 5.3   Urine analysis:    Component Value Date/Time   COLORURINE YELLOW 01/01/2018 2000   APPEARANCEUR CLEAR 01/01/2018 2000   LABSPEC 1.025 01/01/2018 2000   PHURINE 5.0 01/01/2018 2000   GLUCOSEU NEGATIVE 01/01/2018 2000   HGBUR NEGATIVE 01/01/2018 2000   BILIRUBINUR NEGATIVE 01/01/2018 2000   KETONESUR NEGATIVE 01/01/2018 2000   PROTEINUR NEGATIVE 01/01/2018 2000   UROBILINOGEN 4.0 (H) 05/22/2010 1919   NITRITE NEGATIVE 01/01/2018 2000   LEUKOCYTESUR NEGATIVE 01/01/2018 2000   Sepsis Labs: @LABRCNTIP (procalcitonin:4,lacticidven:4) )No results found for this or any previous visit (from the past 240 hour(s)).   Scheduled Meds: . mouth rinse  15 mL Mouth Rinse BID  . metoprolol tartrate  2.5 mg Intravenous Q8H  . pantoprazole (PROTONIX) IV  40 mg Intravenous Q24H   Continuous  Infusions: . 0.9 % NaCl with KCl 20 mEq / L 100 mL/hr at 01/03/18 0552  . potassium PHOSPHATE IVPB (mEq) 20 mEq (01/03/18 1434)    Procedures/Studies: Dg Abd 1 View  Result Date: 01/02/2018 CLINICAL DATA:  Nasogastric tube placement. EXAM: ABDOMEN - 1 VIEW COMPARISON:  CT of the abdomen and pelvis performed 01/01/2018 FINDINGS: The patient's enteric tube is noted ending overlying the body of the stomach. The visualized bowel gas pattern is unremarkable. Scattered air and stool filled loops of colon are seen; no abnormal dilatation of small bowel loops is seen to suggest small bowel obstruction. No free intra-abdominal air is identified, though evaluation for free air is limited on a single supine view. The visualized osseous structures are within normal limits; the sacroiliac joints are unremarkable in appearance. The visualized lung bases are essentially clear. IMPRESSION: Enteric tube noted ending overlying the body of the  stomach. These results were called by telephone at the time of interpretation on 01/02/2018 at 12:35 am to Nursing at [facility] 3A, who verbally acknowledged these results. Electronically Signed   By: Roanna Raider M.D.   On: 01/02/2018 00:36   Ct Abdomen Pelvis W Contrast  Result Date: 01/01/2018 CLINICAL DATA:  Chronic right-sided abdominal pain and diarrhea. Hematochezia. EXAM: CT ABDOMEN AND PELVIS WITH CONTRAST TECHNIQUE: Multidetector CT imaging of the abdomen and pelvis was performed using the standard protocol following bolus administration of intravenous contrast. CONTRAST:  ISOVUE-300 IOPAMIDOL (ISOVUE-300) INJECTION 61% COMPARISON:  CT of the abdomen and pelvis from 12/16/2017 FINDINGS: Lower chest: The visualized lung bases are grossly clear. The visualized portions of the mediastinum are unremarkable. Hepatobiliary: A stable 9 mm hypodensity is noted at the hepatic dome. The liver is otherwise unremarkable in appearance. The gallbladder is within normal limits.  The common bile duct is normal in caliber. Pancreas: The pancreas is within normal limits. Spleen: The spleen is unremarkable in appearance. Adrenals/Urinary Tract: The adrenal glands are unremarkable in appearance. The kidneys are within normal limits. There is no evidence of hydronephrosis. No renal or ureteral stones are identified. No perinephric stranding is seen. Stomach/Bowel: There is dilatation of small-bowel loops to 4.2 cm in maximal diameter, with gradual dilution of contrast. A transition point is noted at the right lower quadrant, about the mid ileum. This is compatible with relatively high-grade small bowel obstruction, likely secondary to an adhesion. A bowel suture line is noted at the mid to distal ileum, unremarkable in appearance. There is apparent wall thickening at the pylorus. This is somewhat better characterized than on the prior study. Endoscopy would be helpful for further evaluation, to exclude underlying mass or ulceration. There is question of underlying mildly prominent nodes, measuring up to 8 mm in short axis. The appendix is normal in caliber, without evidence of appendicitis. Minimal diverticulosis is noted about much of the colon. The colon is otherwise unremarkable in appearance. Vascular/Lymphatic: The abdominal aorta is unremarkable in appearance. The inferior vena cava is grossly unremarkable. No retroperitoneal lymphadenopathy is seen. No pelvic sidewall lymphadenopathy is identified. Reproductive: The bladder is mildly distended and grossly unremarkable. The prostate is enlarged, measuring 6.5 cm in transverse dimension, with minimal calcification. Other: No additional soft tissue abnormalities are seen. Musculoskeletal: No acute osseous abnormalities are identified. The visualized musculature is unremarkable in appearance. IMPRESSION: 1. Dilatation of small-bowel loops to 4.2 cm in maximal diameter, with transition point at the right lower quadrant, about the mid ileum.  This is compatible with relatively high-grade small bowel obstruction, likely secondary to an adhesion. 2. Apparent wall thickening at the pylorus. This is somewhat better characterized than on the prior study. Question of underlying mildly prominent nodes, measuring up to 8 mm in short axis. Endoscopy would be helpful for further evaluation, to exclude underlying mass or ulceration. 3. Significantly enlarged prostate. 4. Minimal diverticulosis about much of the colon, without evidence of diverticulitis. These results were called by telephone at the time of interpretation on 01/01/2018 at 9:19 pm to Dr. Loren Racer, who verbally acknowledged these results. Electronically Signed   By: Roanna Raider M.D.   On: 01/01/2018 21:22   Ct Abdomen Pelvis W Contrast  Result Date: 12/16/2017 CLINICAL DATA:  Nausea and vomiting EXAM: CT ABDOMEN AND PELVIS WITH CONTRAST TECHNIQUE: Multidetector CT imaging of the abdomen and pelvis was performed using the standard protocol following bolus administration of intravenous contrast. CONTRAST:  ISOVUE-300 IOPAMIDOL (  ISOVUE-300) INJECTION 61% COMPARISON:  None. FINDINGS: Lower chest: No acute abnormality. Hepatobiliary: No focal liver abnormality is seen. No gallstones, gallbladder wall thickening, or biliary dilatation. Pancreas: Unremarkable. No pancreatic ductal dilatation or surrounding inflammatory changes. Spleen: Normal in size without focal abnormality. Adrenals/Urinary Tract: Adrenal glands are unremarkable. Kidneys are normal, without renal calculi, focal lesion, or hydronephrosis. Bladder is unremarkable. Stomach/Bowel: No bowel wall thickening. Multiple air-fluid levels in the stomach, small bowel and colon as can be seen with enterocolitis. No pneumatosis, pneumoperitoneum or portal venous gas. Vascular/Lymphatic: No significant vascular findings are present. No enlarged abdominal or pelvic lymph nodes. Reproductive: Prostate is enlarged. Other: No abdominal  wall hernia or abnormality. No abdominopelvic ascites. Musculoskeletal: No acute osseous abnormality. No aggressive osseous lesion. Degenerative disc disease disc height loss throughout the lumbar spine. IMPRESSION: 1. Multiple air-fluid levels in the stomach, small bowel and colon as can be seen with enterocolitis. Electronically Signed   By: Elige Ko   On: 12/16/2017 18:40   Dg Small Bowel  Result Date: 01/03/2018 CLINICAL DATA:  Small bowel obstruction EXAM: SMALL BOWEL SERIES COMPARISON:  CT abdomen and pelvis 01/01/2018 TECHNIQUE: Following administration of water-soluble contrast (Isovue-300) via nasogastric tube, serial small bowel images were obtained including spot views of the terminal ileum. FLUOROSCOPY TIME:  Fluoroscopy Time:  1 minutes 48 seconds Radiation Exposure Index (if provided by the fluoroscopic device): 47.5 mGy Number of Acquired Spot Images: 4 plus multiple fluoroscopic screen captures FINDINGS: Scout image demonstrates retained contrast throughout a nondistended colon from recent CT. Appendix visualized. Following contrast material, no gastric outlet obstruction identified. Contrast opacifies a normal appearing duodenum. Normal position of ligament of Treitz. Jejunal loops normal appearance without dilatation or fold thickening. Ileal loops are dilated in RIGHT upper quadrant and mid abdomen extending into RIGHT pelvis. A prominent small bowel loop in the lateral RIGHT mid abdomen persists throughout the exam, terminating just above the iliac crest, corresponding to the transition zone/site of obstruction on preceding CT exam consistent with incomplete small bowel obstruction; oral contrast from the prior CT exam is present within the colon indicating a lack of complete obstruction. No definite bowel wall thickening. Distal ileal loops appear decompressed. Focal compression views of the small bowel loops do not demonstrate evidence of wall thickening or mass. IMPRESSION: Mid ileal  small bowel obstruction in the RIGHT mid abdomen as identified on the preceding CT exam without obvious mass or wall thickening. Electronically Signed   By: Ulyses Southward M.D.   On: 01/03/2018 15:27   Dg Abd Acute W/chest  Result Date: 12/23/2017 CLINICAL DATA:  Generalized abdominal pain with intermittent nausea, vomiting and diarrhea for 2 weeks. EXAM: DG ABDOMEN ACUTE W/ 1V CHEST COMPARISON:  Chest radiograph, 04/29/2017. Abdomen and pelvis CT, 12/16/2017. FINDINGS: Mildly distended loops of small bowel are noted with multiple air-fluid levels on the erect view. There is no free air. No evidence of renal or ureteral stones. Soft tissues are unremarkable. Heart is normal size.  No mediastinal or hilar masses.  Clear lungs. No acute skeletal abnormality. IMPRESSION: 1. Mildly distended small bowel with multiple air-fluid levels. This may reflect an adynamic ileus or low grade partial small bowel obstruction. Similar findings were present on the prior abdomen and pelvis CT. 2. No free air. 3. No active cardiopulmonary disease. Electronically Signed   By: Amie Portland M.D.   On: 12/23/2017 18:32   Dg Abdomen Acute W/chest  Result Date: 12/16/2017 CLINICAL DATA:  Abdominal pain EXAM: DG ABDOMEN ACUTE  W/ 1V CHEST COMPARISON:  11/25/2017, 02/23/2017 FINDINGS: Single-view chest demonstrates no acute consolidation or effusion. Normal cardiomediastinal silhouette. No pneumothorax. Surgical changes in the cervical spine. Supine and upright views of the abdomen demonstrate no free air beneath the diaphragm. Multiple loops of dilated small bowel measuring up to 4.4 cm with multiple fluid levels on upright exam. Gas in the colon, also with fluid level. Coarse dystrophic appearing calcifications in the left groin. Phleboliths in the pelvis. IMPRESSION: 1. No radiographic evidence for acute cardiopulmonary abnormality. 2. Multiple loops of dilated small bowel with fluid levels, but with scattered colon gas present, also  containing fluid level; findings could be secondary to ileus but developing small bowel obstruction is also a concern. Consider radiographic follow-up. Electronically Signed   By: Jasmine PangKim  Fujinaga M.D.   On: 12/16/2017 16:59   [place] Abdomen Limited Ruq  Result Date: 12/12/2017 CLINICAL DATA:  Right upper quadrant pain and nausea. EXAM: ULTRASOUND ABDOMEN LIMITED RIGHT UPPER QUADRANT COMPARISON:  None. FINDINGS: Gallbladder: No gallstones or wall thickening visualized. No sonographic Murphy sign noted by sonographer. Common bile duct: Diameter: 3 mm, within normal limits. Liver: Diffusely increased in echogenicity. Left hepatic lobe is poorly visualized due to bowel gas. Portal vein is patent on color Doppler imaging with normal direction of blood flow towards the liver. IMPRESSION: 1. No acute findings to explain the patient's right upper quadrant pain and nausea. 2. Hepatic steatosis. Electronically Signed   By: Leanna BattlesMelinda  Blietz M.D.   On: 12/12/2017 09:44    Thomas Hartshornavid Rieley Khalsa, DO  [facility] Pager [phone]  If 7PM-7AM, please contact night-coverage [email or web address] Password [email or web address] 01/03/2018, 3:46 PM   LOS: 2 days

## 2018-01-03 NOTE — Progress Notes (Signed)
Subjective: No abdominal pain, N/V. No flatus. No BM. UGI with SBFT today.   Objective: Vital signs in last 24 hours: Temp:  [97.6 F (36.4 C)-98.6 F (37 C)] 98.6 F (37 C) (04/16 0651) Pulse Rate:  [61-63] 62 (04/16 0651) Resp:  [18-20] 18 (04/16 0651) BP: (108-122)/(75-87) 108/75 (04/16 0651) SpO2:  [97 %-99 %] 97 % (04/16 0651) Last BM Date: 01/01/18 General:   Alert and oriented, pleasant Abdomen:  Bowel sounds more active today, less distension, no TTP,  Msk:  Symmetrical without gross deformities. Normal posture. Neurologic:  Alert and  oriented x4 Psych:  Alert and cooperative. Normal mood and affect.  Intake/Output from previous day: 04/15 0701 - 04/16 0700 In: 1264.6 [I.V.:910; IV Piggyback:354.6] Out: 1220 [Urine:750; Emesis/NG output:470] Intake/Output this shift: No intake/output data recorded.  Lab Results: Recent Labs    01/01/18 1830 01/02/18 0804 01/03/18 0621  WBC 9.3 7.9 7.2  HGB 13.1 11.7* 11.8*  HCT 40.0 36.0* 36.3*  PLT 361 326 334   BMET Recent Labs    01/01/18 1830 01/02/18 0804 01/03/18 0621  NA 144 139 139  K 4.0 3.8 3.9  CL 112* 110 109  CO2 21* 22 20*  GLUCOSE 135* 98 99  BUN 16 12 9   CREATININE 1.20 1.04 0.99  CALCIUM 9.6 8.4* 8.6*   LFT Recent Labs    01/01/18 1830  PROT 7.9  ALBUMIN 3.9  AST 31  ALT 24  ALKPHOS 84  BILITOT 0.3     Studies/Results: Dg Abd 1 View  Result Date: 01/02/2018 CLINICAL DATA:  Nasogastric tube placement. EXAM: ABDOMEN - 1 VIEW COMPARISON:  CT of the abdomen and pelvis performed 01/01/2018 FINDINGS: The patient's enteric tube is noted ending overlying the body of the stomach. The visualized bowel gas pattern is unremarkable. Scattered air and stool filled loops of colon are seen; no abnormal dilatation of small bowel loops is seen to suggest small bowel obstruction. No free intra-abdominal air is identified, though evaluation for free air is limited on a single supine view. The visualized  osseous structures are within normal limits; the sacroiliac joints are unremarkable in appearance. The visualized lung bases are essentially clear. IMPRESSION: Enteric tube noted ending overlying the body of the stomach. These results were called by telephone at the time of interpretation on 01/02/2018 at 12:35 am [facility], who verbally acknowledged these results. Electronically Signed   By: Roanna RaiderJeffery  Chang M.D.   On: 01/02/2018 00:36   Ct Abdomen Pelvis W Contrast  Result Date: 01/01/2018 CLINICAL DATA:  Chronic right-sided abdominal pain and diarrhea. Hematochezia. EXAM: CT ABDOMEN AND PELVIS WITH CONTRAST TECHNIQUE: Multidetector CT imaging of the abdomen and pelvis was performed using the standard protocol following bolus administration of intravenous contrast. CONTRAST:  100mL ISOVUE-300 IOPAMIDOL (ISOVUE-300) INJECTION 61% COMPARISON:  CT of the abdomen and pelvis from 12/16/2017 FINDINGS: Lower chest: The visualized lung bases are grossly clear. The visualized portions of the mediastinum are unremarkable. Hepatobiliary: A stable 9 mm hypodensity is noted at the hepatic dome. The liver is otherwise unremarkable in appearance. The gallbladder is within normal limits. The common bile duct is normal in caliber. Pancreas: The pancreas is within normal limits. Spleen: The spleen is unremarkable in appearance. Adrenals/Urinary Tract: The adrenal glands are unremarkable in appearance. The kidneys are within normal limits. There is no evidence of hydronephrosis. No renal or ureteral stones are identified. No perinephric stranding is seen. Stomach/Bowel: There is dilatation of small-bowel loops  to 4.2 cm in maximal diameter, with gradual dilution of contrast. A transition point is noted at the right lower quadrant, about the mid ileum. This is compatible with relatively high-grade small bowel obstruction, likely secondary to an adhesion. A bowel suture line is noted at the mid to distal ileum,  unremarkable in appearance. There is apparent wall thickening at the pylorus. This is somewhat better characterized than on the prior study. Endoscopy would be helpful for further evaluation, to exclude underlying mass or ulceration. There is question of underlying mildly prominent nodes, measuring up to 8 mm in short axis. The appendix is normal in caliber, without evidence of appendicitis. Minimal diverticulosis is noted about much of the colon. The colon is otherwise unremarkable in appearance. Vascular/Lymphatic: The abdominal aorta is unremarkable in appearance. The inferior vena cava is grossly unremarkable. No retroperitoneal lymphadenopathy is seen. No pelvic sidewall lymphadenopathy is identified. Reproductive: The bladder is mildly distended and grossly unremarkable. The prostate is enlarged, measuring 6.5 cm in transverse dimension, with minimal calcification. Other: No additional soft tissue abnormalities are seen. Musculoskeletal: No acute osseous abnormalities are identified. The visualized musculature is unremarkable in appearance. IMPRESSION: 1. Dilatation of small-bowel loops to 4.2 cm in maximal diameter, with transition point at the right lower quadrant, about the mid ileum. This is compatible with relatively high-grade small bowel obstruction, likely secondary to an adhesion. 2. Apparent wall thickening at the pylorus. This is somewhat better characterized than on the prior study. Question of underlying mildly prominent nodes, measuring up to 8 mm in short axis. Endoscopy would be helpful for further evaluation, to exclude underlying mass or ulceration. 3. Significantly enlarged prostate. 4. Minimal diverticulosis about much of the colon, without evidence of diverticulitis. These results were called by telephone at the time of interpretation on 01/01/2018 at 9:19 pm to Dr. Loren Racer, who verbally acknowledged these results. Electronically Signed   By: Roanna Raider M.D.   On: 01/01/2018  21:22    Assessment: 67 year old male admitted with small bowel obstruction, and CT imaging with concern for possible wall-thickening at pylorus. UGI with SBFT planned for today per surgery. Tentative plans for exploratory laparotomy Wednesday if necessary. He would benefit from endoscopic evaluation to rule out concerning process prior to surgery.    Plan: UGI with SBFT today per surgery Needs diagnostic EGD in near future: recommend prior to surgery Will continue to follow with you  Gelene Mink, PhD, ANP-BC [facility]    LOS: 2 days    01/03/2018, 10:16 AM

## 2018-01-03 NOTE — Progress Notes (Signed)
[facility] Progress Note     Subjective: No major complaints. Passed flatus once. NG with thick output. Pain ok, still bloated.    Objective: Vital signs in last 24 hours: Temp:  [97.6 F (36.4 C)-98.6 F (37 C)] 98.6 F (37 C) (04/16 0651) Pulse Rate:  [61-63] 62 (04/16 0651) Resp:  [18-20] 18 (04/16 0651) BP: (108-122)/(75-87) 108/75 (04/16 0651) SpO2:  [97 %-99 %] 97 % (04/16 0651) Last BM Date: 01/01/18  Intake/Output from previous day: 04/15 0701 - 04/16 0700 In: 1264.6 [I.V.:910; IV Piggyback:354.6] Out: 1220 [Urine:750; Emesis/NG output:470] Intake/Output this shift: No intake/output data recorded.  General appearance: alert, cooperative and no distress Resp: normal work breathing GI: soft, distended, minimally tender Extremities: extremities normal, atraumatic, no cyanosis or edema  Rectal exam: normal tone, no masses, no gross blood  Midline scar healed, no obvious hernias   Lab Results:  Recent Labs    01/02/18 0804 01/03/18 0621  WBC 7.9 7.2  HGB 11.7* 11.8*  HCT 36.0* 36.3*  PLT 326 334   BMET Recent Labs    01/02/18 0804 01/03/18 0621  NA 139 139  K 3.8 3.9  CL 110 109  CO2 22 20*  GLUCOSE 98 99  BUN 12 9  CREATININE 1.04 0.99  CALCIUM 8.4* 8.6*    Assessment/Plan: [name] is a 67 yo with SBO that is not resolving. Continues to have distention and NG output. No Bms. Also with thickening of the stomach. GI wanting EGD in the future.  -SBFT today to assess the obstruction -Will coordinate with GI regarding timing for EGD, but if obstructed on the SBFT, there is a limit to the amount of time we can defer surgery for the obstruction  -On schedule for tomorrow but will determine if need to cancel based on the SBFT later today    LOS: 2 days    Lucretia RoersLindsay C Bridges 01/03/2018

## 2018-01-04 ENCOUNTER — Inpatient Hospital Stay (HOSPITAL_COMMUNITY): Payer: Medicare Other

## 2018-01-04 ENCOUNTER — Encounter (HOSPITAL_COMMUNITY)
Admission: EM | Disposition: A | Discharge status: Home / Self Care | Payer: Self-pay | Source: Home / Self Care | Attending: General Surgery

## 2018-01-04 DIAGNOSIS — R933 Abnormal findings on diagnostic imaging of other parts of digestive tract: Secondary | ICD-10-CM

## 2018-01-04 DIAGNOSIS — K259 Gastric ulcer, unspecified as acute or chronic, without hemorrhage or perforation: Secondary | ICD-10-CM

## 2018-01-04 DIAGNOSIS — K209 Esophagitis, unspecified: Secondary | ICD-10-CM

## 2018-01-04 HISTORY — PX: ESOPHAGOGASTRODUODENOSCOPY: SHX5428

## 2018-01-04 HISTORY — PX: BIOPSY: SHX5522

## 2018-01-04 LAB — CBC
HCT: 37.2 % — ABNORMAL LOW (ref 39.0–52.0)
HEMOGLOBIN: 11.9 g/dL — AB (ref 13.0–17.0)
MCH: 30 pg (ref 26.0–34.0)
MCHC: 32 g/dL (ref 30.0–36.0)
MCV: 93.7 fL (ref 78.0–100.0)
PLATELETS: 343 10*3/uL (ref 150–400)
RBC: 3.97 MIL/uL — AB (ref 4.22–5.81)
RDW: 14 % (ref 11.5–15.5)
WBC: 9.7 10*3/uL (ref 4.0–10.5)

## 2018-01-04 LAB — TYPE AND SCREEN
ABO/RH(D): O POS
Antibody Screen: NEGATIVE

## 2018-01-04 LAB — GLUCOSE, CAPILLARY
GLUCOSE-CAPILLARY: 62 mg/dL — AB (ref 65–99)
Glucose-Capillary: 103 mg/dL — ABNORMAL HIGH (ref 65–99)
Glucose-Capillary: 71 mg/dL (ref 65–99)
Glucose-Capillary: 79 mg/dL (ref 65–99)
Glucose-Capillary: 89 mg/dL (ref 65–99)

## 2018-01-04 LAB — BASIC METABOLIC PANEL
Anion gap: 11 (ref 5–15)
BUN: 10 mg/dL (ref 6–20)
CO2: 19 mmol/L — ABNORMAL LOW (ref 22–32)
CREATININE: 0.93 mg/dL (ref 0.61–1.24)
Calcium: 8.9 mg/dL (ref 8.9–10.3)
Chloride: 113 mmol/L — ABNORMAL HIGH (ref 101–111)
Glucose, Bld: 99 mg/dL (ref 65–99)
POTASSIUM: 3.7 mmol/L (ref 3.5–5.1)
SODIUM: 143 mmol/L (ref 135–145)

## 2018-01-04 LAB — MAGNESIUM: MAGNESIUM: 1.8 mg/dL (ref 1.7–2.4)

## 2018-01-04 LAB — PHOSPHORUS: PHOSPHORUS: 2.7 mg/dL (ref 2.5–4.6)

## 2018-01-04 SURGERY — EGD (ESOPHAGOGASTRODUODENOSCOPY)
Anesthesia: Moderate Sedation

## 2018-01-04 MED ORDER — STERILE WATER FOR IRRIGATION IR SOLN
Status: DC | PRN
  Administered 2018-01-04: 11:00:00

## 2018-01-04 MED ORDER — THIAMINE HCL 100 MG/ML IJ SOLN
100.0000 mg | Freq: Every day | INTRAMUSCULAR | Status: DC
  Administered 2018-01-04 – 2018-01-10 (×6): 100 mg via INTRAVENOUS
  Filled 2018-01-04 (×6): qty 2

## 2018-01-04 MED ORDER — MEPERIDINE HCL 100 MG/ML IJ SOLN
INTRAMUSCULAR | Status: DC | PRN
  Administered 2018-01-04 (×3): 25 mg via INTRAVENOUS

## 2018-01-04 MED ORDER — KCL IN DEXTROSE-NACL 20-5-0.9 MEQ/L-%-% IV SOLN
INTRAVENOUS | Status: DC
  Administered 2018-01-04: via INTRAVENOUS

## 2018-01-04 MED ORDER — SODIUM CHLORIDE 0.9 % IV SOLN
2.0000 g | INTRAVENOUS | Status: AC
  Administered 2018-01-05: 2 g via INTRAVENOUS
  Filled 2018-01-04: qty 2

## 2018-01-04 MED ORDER — CHLORHEXIDINE GLUCONATE CLOTH 2 % EX PADS
6.0000 | MEDICATED_PAD | Freq: Once | CUTANEOUS | Status: AC
  Administered 2018-01-04: 6 via TOPICAL

## 2018-01-04 MED ORDER — SODIUM CHLORIDE 0.9 % IV SOLN
INTRAVENOUS | Status: DC
  Administered 2018-01-04: 1000 mL via INTRAVENOUS

## 2018-01-04 MED ORDER — CHLORHEXIDINE GLUCONATE CLOTH 2 % EX PADS
6.0000 | MEDICATED_PAD | Freq: Once | CUTANEOUS | Status: AC
  Administered 2018-01-05: 6 via TOPICAL

## 2018-01-04 MED ORDER — MEPERIDINE HCL 100 MG/ML IJ SOLN
INTRAMUSCULAR | Status: AC
  Filled 2018-01-04: qty 2

## 2018-01-04 MED ORDER — LIDOCAINE VISCOUS 2 % MT SOLN
OROMUCOSAL | Status: AC
  Filled 2018-01-04: qty 15

## 2018-01-04 MED ORDER — ONDANSETRON HCL 4 MG/2ML IJ SOLN
INTRAMUSCULAR | Status: AC
  Filled 2018-01-04: qty 2

## 2018-01-04 MED ORDER — ONDANSETRON HCL 4 MG/2ML IJ SOLN
INTRAMUSCULAR | Status: DC | PRN
  Administered 2018-01-04: 4 mg via INTRAVENOUS

## 2018-01-04 MED ORDER — MIDAZOLAM HCL 5 MG/5ML IJ SOLN
INTRAMUSCULAR | Status: AC
  Filled 2018-01-04: qty 10

## 2018-01-04 MED ORDER — MIDAZOLAM HCL 5 MG/5ML IJ SOLN
INTRAMUSCULAR | Status: DC | PRN
  Administered 2018-01-04 (×3): 1 mg via INTRAVENOUS
  Administered 2018-01-04: 2 mg via INTRAVENOUS

## 2018-01-04 MED ORDER — SODIUM CHLORIDE 0.9 % IV SOLN
2.0000 g | INTRAVENOUS | Status: DC
  Filled 2018-01-04: qty 2

## 2018-01-04 MED ORDER — LIDOCAINE VISCOUS 2 % MT SOLN
OROMUCOSAL | Status: DC | PRN
  Administered 2018-01-04: 4 mL via OROMUCOSAL

## 2018-01-04 MED ORDER — FOLIC ACID 5 MG/ML IJ SOLN
1.0000 mg | Freq: Every day | INTRAMUSCULAR | Status: DC
  Administered 2018-01-04 – 2018-01-10 (×6): 1 mg via INTRAVENOUS
  Filled 2018-01-04 (×8): qty 0.2

## 2018-01-04 NOTE — Progress Notes (Signed)
Patient seen and examined in endoscopy. Plan for EGD today. The risks, benefits, limitations, alternatives and imponderables have been reviewed with the patient. Potential for esophageal dilation, biopsy, etc. have also been reviewed.  Questions have been answered. All parties agreeable.  Further recommendations to follow.

## 2018-01-04 NOTE — Op Note (Signed)
[facility] Patient Name: Thomas [name] Procedure Date: 01/04/2018 10:17 AM MRN: [id number] Date of Birth: 1950-10-25 Attending MD: Gennette Pac , MD CSN: [id number] Age: 67 Admit Type: Inpatient Procedure:                Upper GI endoscopy Indications:              Abnormal CT of the GI tract Providers:                Gennette Pac, MD, Loma Messing B. Mathis Fare RN, RN,                            Dyann Ruddle Referring MD:              Medicines:                Midazolam 5 mg IV, Meperidine 75 mg IV Complications:            No immediate complications. Estimated Blood Loss:     Estimated blood loss was minimal. Procedure:                Pre-Anesthesia Assessment:                           - Prior to the procedure, a History and Physical                            was performed, and patient medications and                            allergies were reviewed. The patient's tolerance of                            previous anesthesia was also reviewed. The risks                            and benefits of the procedure and the sedation                            options and risks were discussed with the patient.                            All questions were answered, and informed consent                            was obtained. Prior Anticoagulants: The patient has                            taken no previous anticoagulant or antiplatelet                            agents. ASA Grade Assessment: III - A patient with                            severe systemic disease. After reviewing the risks  and benefits, the patient was deemed in                            satisfactory condition to undergo the procedure.                           After obtaining informed consent, the endoscope was                            passed under direct vision. Throughout the                            procedure, the patient's blood pressure, pulse, and                             oxygen saturations were monitored continuously. The                            [id number]) scope was introduced through the                            mouth, and advanced to the second part of duodenum.                            The upper GI endoscopy was accomplished without                            difficulty. The patient tolerated the procedure                            well. Scope In: 10:42:10 AM Scope Out: 10:52:47 AM Total Procedure Duration: 0 hours 10 minutes 37 seconds  Findings:      EGD performed alongside of NG tube. NG tube was not removed for the       procedure.      Esophagitis was found. Distal esophageal erosions within 5 mm the GE       junction. No Barrett's epithelium seen.      Numerous punctate hemorrhages along the greater curvature consistent       with NG tube trauma. Markedly abnormal antral/prepyloric mucosa with       glandular/ adenomatous change well demarcated circumferentially going       into the pyloric channel. centrally located 1.5 cm deep ulcer with       heaped up margins. Pylori channel mildly stenosed but easily traversed       with the scope. Multiple biopsies of abnormal antrum/pylorus taken with       jumbo biopsy forceps.      The bulb, second portion and third portion of the duodenum were normal.       NG tube maintained in place via endoscopic control at the conclusion of       the procedure. Impression:               - Erosive reflux esophagitis.                           - Abnormal ulcerated  pylorus / antrum suspicious                            for infiltrating neoplasm; biopsied. NG tube trauma                           - Normal second portion of the duodenum and third                            portion of the duodenum. Moderate Sedation:      Moderate (conscious) sedation was administered by the endoscopy nurse       and supervised by the endoscopist. The following parameters were       monitored: oxygen saturation, heart  rate, blood pressure, respiratory       rate, EKG, adequacy of pulmonary ventilation, and response to care.       Total physician intraservice time was 18 minutes. Recommendation:           - NPO. NG remains in place for now.                           - Await pathology results. Continue PPI. Hold off                            on laparotomy pending review of gastric biopsies (I                            have sent them RUSH).                           - Keep NG in place for now. Procedure Code(s):        --- Professional ---                           [phone], Esophagogastroduodenoscopy, flexible,                            transoral; diagnostic, including collection of                            specimen(s) by brushing or washing, when performed                            (separate procedure)                           G0500, Moderate sedation services provided by the                            same physician or other qualified health care                            professional performing a gastrointestinal                            endoscopic service that sedation supports,  requiring the presence of an independent trained                            observer to assist in the monitoring of the                            patient's level of consciousness and physiological                            status; initial 15 minutes of intra-service time;                            patient age 52 years or older (additional time may                            be reported with [id number], as appropriate) Diagnosis Code(s):        --- Professional ---                           K20.9, Esophagitis, unspecified                           K25.9, Gastric ulcer, unspecified as acute or                            chronic, without hemorrhage or perforation                           R93.3, Abnormal findings on diagnostic imaging of                            other parts of digestive  tract CPT copyright 2017 American Medical Association. All rights reserved. The codes documented in this report are preliminary and upon coder review may  be revised to meet current compliance requirements. Gerrit Friendsobert M. Shailynn Fong, MD Gennette Pacobert Michael Maudell Stanbrough, MD 01/04/2018 11:16:09 AM This report has been signed electronically. Number of Addenda: 0

## 2018-01-04 NOTE — Progress Notes (Signed)
[facility]  Case posted for 9AM tomorrow morning. Will see what pathology shows from today's biopsies. No additional BMs reported.    Algis GreenhouseLindsay Bridges, MD [facility] [address] [place], [place] [id number] [phone] (office)

## 2018-01-04 NOTE — Progress Notes (Addendum)
PROGRESS NOTE    Thomas [name]  UJW:[id number] DOB: 1951/09/17 DOA: 01/01/2018 PCP: Samuella BruinMann, Benjamin L, PA-C     Brief Narrative:  67 year old man admitted from home on 4/14 with a progressively worsening 3-week history of abdominal pain that caused him to come to the emergency department two times prior to this admission on 3/25 and 3/29.  Abdominal ultrasound on 3/25 showed no acute findings except for hepatic steatosis.  CT scan of the abdomen and pelvis on 3/29 showed multiple air-fluid levels in the stomach small bowel and colon consistent with possible enterocolitis.  The patient was discharged home from the emergency department both times with symptomatic management.  He denied any fevers, chills, vomiting, sick contacts or eating undercooked food or any travel.  Because of worsening abdominal pain and distention he presented to the emergency department.  On 4/14 repeat CT abdomen showed small bowel dilatation up to 4.2 cm with a transition zone in the right lower quadrant consistent with a small bowel obstruction.  NG tube was placed for decompression and admission was requested for further evaluation and management.   Assessment & Plan:   Principal Problem:   Small bowel obstruction, partial (HCC) Active Problems:   Dementia   Essential hypertension   Cervical spinal stenosis   Hyperglycemia   CKD (chronic kidney disease) stage 2, GFR 60-89 ml/min   Small bowel obstruction -Continue NG decompression, he continues to have significant NG output. -He is on the schedule for possible exploratory laparotomy on 4/18 pending results of pathology from EGD. -Appreciate Dr. Henreitta LeberBridges input and recommendations. -Patient remains n.p.o.  Pyloric wall thickening -Noted on CT scan on 4/14.   -Patient underwent EGD today with Dr. Jena Gaussourk with findings of a markedly abnormal antral/prepyloric mucosa suspicious for infiltrating neoplasm. -Biopsies have been taken and pathology has been placed as a  rush. -Further recommendations pending results of biopsy.  Chronic kidney disease stage II -Baseline creatinine between 1 and 1.2 -Creatinine remains a little better than baseline at 0.9.  Essential hypertension -Well-controlled, most recent blood pressure 114/81. -Continue IV Lopressor until able to tolerate p.o.  Dementia without behavioral disturbance -Restart Aricept and Remeron once oral route is safe.  Alcohol abuse -In remission, has not had alcohol in 1 year. -Start thiamine/folate IV.   DVT prophylaxis: SCDs Code Status: Full code Family Communication: Caregiver at bedside updated on plan of care and all questions answered Disposition Plan: Pending medical stability and completion of workup, patient for exploratory laparotomy on 4/18  Consultants:   Gastroenterology  General surgery  Procedures:   EGD as above on 4/17  Antimicrobials:  Anti-infectives (From admission, onward)   Start     Dose/Rate Route Frequency Ordered Stop   01/05/18 0900  cefoTEtan (CEFOTAN) 2 g in sodium chloride 0.9 % 100 mL IVPB     2 g 200 mL/hr over 30 Minutes Intravenous On call to O.R. 01/04/18 1449 01/06/18 0559   01/04/18 1145  cefoTEtan (CEFOTAN) 2 g in sodium chloride 0.9 % 100 mL IVPB  Status:  Discontinued     2 g 200 mL/hr over 30 Minutes Intravenous On call to O.R. 01/04/18 1132 01/04/18 1448       Subjective: Lying in bed, feels well, is wondering whether he is going to have surgery tomorrow.  Objective: Vitals:   01/04/18 1100 01/04/18 1115 01/04/18 1340 01/04/18 1418  BP:  117/74 107/76 114/81  Pulse: 82 91 (!) 59   Resp:  18 16   Temp:  98.2 F (36.8 C) 98.2 F (36.8 C)   TempSrc:  Oral Oral   SpO2: 97% 99% 99%   Weight:      Height:        Intake/Output Summary (Last 24 hours) at 01/04/2018 1701 Last data filed at 01/04/2018 1626 Gross per 24 hour  Intake 842.5 ml  Output 625 ml  Net 217.5 ml   Filed Weights   01/01/18 1758 01/01/18 2325  Weight:  81.6 kg (180 lb) 82.7 kg (182 lb 5.1 oz)    Examination:  General exam: Alert, awake, oriented x 3, NG tube placed to intermittent suction Respiratory system: Clear to auscultation. Respiratory effort normal. Cardiovascular system:RRR. No murmurs, rubs, gallops. Gastrointestinal system: Abdomen is nondistended, soft and nontender.  Hypoactive bowel sounds.  Central nervous system: Alert and oriented. No focal neurological deficits. Extremities: No C/C/E, +pedal pulses Skin: No rashes, lesions or ulcers Psychiatry: Judgement and insight appear normal. Mood & affect appropriate.     Data Reviewed: I have personally reviewed following labs and imaging studies  CBC: Recent Labs  Lab 01/01/18 1830 01/02/18 0804 01/03/18 0621 01/04/18 0618  WBC 9.3 7.9 7.2 9.7  NEUTROABS 5.3  --   --   --   HGB 13.1 11.7* 11.8* 11.9*  HCT 40.0 36.0* 36.3* 37.2*  MCV 94.3 93.8 93.6 93.7  PLT 361 326 334 343   Basic Metabolic Panel: Recent Labs  Lab 01/01/18 1830 01/02/18 0804 01/02/18 1133 01/03/18 0621 01/04/18 0618  NA 144 139  --  139 143  K 4.0 3.8  --  3.9 3.7  CL 112* 110  --  109 113*  CO2 21* 22  --  20* 19*  GLUCOSE 135* 98  --  99 99  BUN 16 12  --  9 10  CREATININE 1.20 1.04  --  0.99 0.93  CALCIUM 9.6 8.4*  --  8.6* 8.9  MG  --  1.5*  --  1.9 1.8  PHOS  --   --  2.9 2.6 2.7   GFR: Estimated Creatinine Clearance: 78.6 mL/min (by C-G formula based on SCr of 0.93 mg/dL). Liver Function Tests: Recent Labs  Lab 01/01/18 1830  AST 31  ALT 24  ALKPHOS 84  BILITOT 0.3  PROT 7.9  ALBUMIN 3.9   Recent Labs  Lab 01/01/18 1830  LIPASE 27   No results for input(s): AMMONIA in the last 168 hours. Coagulation Profile: No results for input(s): INR, PROTIME in the last 168 hours. Cardiac Enzymes: No results for input(s): CKTOTAL, CKMB, CKMBINDEX, TROPONINI in the last 168 hours. BNP (last 3 results) No results for input(s): PROBNP in the last 8760 hours. HbA1C: Recent  Labs    01/02/18 0804  HGBA1C 5.3   CBG: Recent Labs  Lab 01/03/18 0649 01/03/18 1416 01/04/18 0019 01/04/18 0648 01/04/18 1308  GLUCAP 90 91 89 103* 79   Lipid Profile: No results for input(s): CHOL, HDL, LDLCALC, TRIG, CHOLHDL, LDLDIRECT in the last 72 hours. Thyroid Function Tests: No results for input(s): TSH, T4TOTAL, FREET4, T3FREE, THYROIDAB in the last 72 hours. Anemia Panel: No results for input(s): VITAMINB12, FOLATE, FERRITIN, TIBC, IRON, RETICCTPCT in the last 72 hours. Urine analysis:    Component Value Date/Time   COLORURINE YELLOW 01/01/2018 2000   APPEARANCEUR CLEAR 01/01/2018 2000   LABSPEC 1.025 01/01/2018 2000   PHURINE 5.0 01/01/2018 2000   GLUCOSEU NEGATIVE 01/01/2018 2000   HGBUR NEGATIVE 01/01/2018 2000   BILIRUBINUR NEGATIVE 01/01/2018 2000   KETONESUR  NEGATIVE 01/01/2018 2000   PROTEINUR NEGATIVE 01/01/2018 2000   UROBILINOGEN 4.0 (H) 05/22/2010 1919   NITRITE NEGATIVE 01/01/2018 2000   LEUKOCYTESUR NEGATIVE 01/01/2018 2000   Sepsis Labs: @LABRCNTIP (procalcitonin:4,lacticidven:4)  )No results found for this or any previous visit (from the past 240 hour(s)).       Radiology Studies: Dg Abd 1 View  Result Date: 01/04/2018 CLINICAL DATA:  Small bowel obstruction, AAA EXAM: ABDOMEN - 1 VIEW COMPARISON:  01/02/2018 FINDINGS: Contrast material within decompressed colon. Dilated small bowel loops noted in the right upper abdomen. NG tube remains in the stomach. No free air organomegaly. IMPRESSION: Continued dilated small bowel loops in the right upper abdomen. Oral contrast material within decompressed colon. Findings compatible with partial small bowel obstruction. No real change. Electronically Signed   By: Charlett Nose M.D.   On: 01/04/2018 08:36   Dg Small Bowel  Result Date: 01/03/2018 CLINICAL DATA:  Small bowel obstruction EXAM: SMALL BOWEL SERIES COMPARISON:  CT abdomen and pelvis 01/01/2018 TECHNIQUE: Following administration of  water-soluble contrast (Isovue-300) via nasogastric tube, serial small bowel images were obtained including spot views of the terminal ileum. FLUOROSCOPY TIME:  Fluoroscopy Time:  1 minutes 48 seconds Radiation Exposure Index (if provided by the fluoroscopic device): 47.5 mGy Number of Acquired Spot Images: 4 plus multiple fluoroscopic screen captures FINDINGS: Scout image demonstrates retained contrast throughout a nondistended colon from recent CT. Appendix visualized. Following contrast material, no gastric outlet obstruction identified. Contrast opacifies a normal appearing duodenum. Normal position of ligament of Treitz. Jejunal loops normal appearance without dilatation or fold thickening. Ileal loops are dilated in RIGHT upper quadrant and mid abdomen extending into RIGHT pelvis. A prominent small bowel loop in the lateral RIGHT mid abdomen persists throughout the exam, terminating just above the iliac crest, corresponding to the transition zone/site of obstruction on preceding CT exam consistent with incomplete small bowel obstruction; oral contrast from the prior CT exam is present within the colon indicating a lack of complete obstruction. No definite bowel wall thickening. Distal ileal loops appear decompressed. Focal compression views of the small bowel loops do not demonstrate evidence of wall thickening or mass. IMPRESSION: Mid ileal small bowel obstruction in the RIGHT mid abdomen as identified on the preceding CT exam without obvious mass or wall thickening. Electronically Signed   By: Ulyses Southward M.D.   On: 01/03/2018 15:27   Dg Abd Portable 1v  Result Date: 01/04/2018 CLINICAL DATA:  NG tube placement EXAM: PORTABLE ABDOMEN - 1 VIEW COMPARISON:  KUB of 01/04/2018, and CT abdomen pelvis of 01/01/2018 FINDINGS: There is contrast throughout the nondistended colon. NG tube tip lies in the region of the expected antrum of the stomach. As noted previously a loop of small bowel again is filled with  air in the right abdomen possibly indicating persistent partial small bowel obstruction. No opaque calculi are noted. There are degenerative changes in the mid to lower lumbar spine. IMPRESSION: 1. Tip of NG tube lies in the expected region of the distal antrum of the stomach. 2. No change in gaseous distention of small bowel in the right abdomen which may indicate persistent partial small bowel obstruction. Electronically Signed   By: Dwyane Dee M.D.   On: 01/04/2018 12:23        Scheduled Meds: . Chlorhexidine Gluconate Cloth  6 each Topical Once   And  . Chlorhexidine Gluconate Cloth  6 each Topical Once  . mouth rinse  15 mL Mouth Rinse BID  .  metoprolol tartrate  2.5 mg Intravenous Q8H  . pantoprazole (PROTONIX) IV  40 mg Intravenous Q24H   Continuous Infusions: . 0.9 % NaCl with KCl 20 mEq / L 75 mL/hr at 01/04/18 1148  . [START ON 01/05/2018] cefoTEtan (CEFOTAN) IV       LOS: 3 days    Time spent: 35 minutes. Greater than 50% of this time was spent in direct contact with the patient, coordinating care and discussing relevant ongoing clinical issues with patient and patient's caregiver at bedside per patient's request, including results of EGD, potential implications if this indeed results to be a malignancy and potential outcomes of exploratory laparotomy scheduled for 4/18.  Patient had many questions that I answered to the best of my knowledge.     Chaya Jan, MD [facility] Pager [phone]  If 7PM-7AM, please contact night-coverage [email or web address] Password [email or web address] 01/04/2018, 5:01 PM

## 2018-01-04 NOTE — Progress Notes (Signed)
[facility] Progress Note  Day of Surgery  Subjective: No major issues. Had some liquid BMs. No nausea. NG output down. EGD this AM and infiltrating ulcer concerning for possible malignancy. GI has sent biopsies rushed per their documentation.   Objective: Vital signs in last 24 hours: Temp:  [98.2 F (36.8 C)-99 F (37.2 C)] 98.2 F (36.8 C) (04/17 1115) Pulse Rate:  [64-118] 91 (04/17 1115) Resp:  [16-22] 18 (04/17 1115) BP: (116-157)/(74-98) 117/74 (04/17 1115) SpO2:  [97 %-100 %] 99 % (04/17 1115) Last BM Date: 01/04/18  Intake/Output from previous day: 04/16 0701 - 04/17 0700 In: 1967.1 [I.V.:1712.5; IV Piggyback:254.6] Out: 151 [Emesis/NG output:150; Stool:1] Intake/Output this shift: Total I/O In: -  Out: 500 [Urine:250; Emesis/NG output:50; Stool:200]  General appearance: alert, cooperative and no distress Resp: normal work breathing GI: distended, nontender, no guarding or rebound Extremities: extremities normal, atraumatic, no cyanosis or edema  Lab Results:  Recent Labs    01/03/18 0621 01/04/18 0618  WBC 7.2 9.7  HGB 11.8* 11.9*  HCT 36.3* 37.2*  PLT 334 343   BMET Recent Labs    01/03/18 0621 01/04/18 0618  NA 139 143  K 3.9 3.7  CL 109 113*  CO2 20* 19*  GLUCOSE 99 99  BUN 9 10  CREATININE 0.99 0.93  CALCIUM 8.6* 8.9   SBFT - bowel obstruction, Xray this AM with continued dilated bowels/ contrast in colon seems like partial obstruction at least continuing   Studies/Results: Dg Abd 1 View  Result Date: 01/04/2018 CLINICAL DATA:  Small bowel obstruction, AAA EXAM: ABDOMEN - 1 VIEW COMPARISON:  01/02/2018 FINDINGS: Contrast material within decompressed colon. Dilated small bowel loops noted in the right upper abdomen. NG tube remains in the stomach. No free air organomegaly. IMPRESSION: Continued dilated small bowel loops in the right upper abdomen. Oral contrast material within decompressed colon. Findings compatible with  partial small bowel obstruction. No real change. Electronically Signed   By: Charlett Nose M.D.   On: 01/04/2018 08:36   Dg Small Bowel  Result Date: 01/03/2018 CLINICAL DATA:  Small bowel obstruction EXAM: SMALL BOWEL SERIES COMPARISON:  CT abdomen and pelvis 01/01/2018 TECHNIQUE: Following administration of water-soluble contrast (Isovue-300) via nasogastric tube, serial small bowel images were obtained including spot views of the terminal ileum. FLUOROSCOPY TIME:  Fluoroscopy Time:  1 minutes 48 seconds Radiation Exposure Index (if provided by the fluoroscopic device): 47.5 mGy Number of Acquired Spot Images: 4 plus multiple fluoroscopic screen captures FINDINGS: Scout image demonstrates retained contrast throughout a nondistended colon from recent CT. Appendix visualized. Following contrast material, no gastric outlet obstruction identified. Contrast opacifies a normal appearing duodenum. Normal position of ligament of Treitz. Jejunal loops normal appearance without dilatation or fold thickening. Ileal loops are dilated in RIGHT upper quadrant and mid abdomen extending into RIGHT pelvis. A prominent small bowel loop in the lateral RIGHT mid abdomen persists throughout the exam, terminating just above the iliac crest, corresponding to the transition zone/site of obstruction on preceding CT exam consistent with incomplete small bowel obstruction; oral contrast from the prior CT exam is present within the colon indicating a lack of complete obstruction. No definite bowel wall thickening. Distal ileal loops appear decompressed. Focal compression views of the small bowel loops do not demonstrate evidence of wall thickening or mass. IMPRESSION: Mid ileal small bowel obstruction in the RIGHT mid abdomen as identified on the preceding CT exam without obvious mass or wall thickening. Electronically Signed   By: Loraine Leriche  Tyron RussellBoles M.D.   On: 01/03/2018 15:27    Anti-infectives: Anti-infectives (From admission, onward)    Start     Dose/Rate Route Frequency Ordered Stop   01/04/18 1145  cefoTEtan (CEFOTAN) 2 g in sodium chloride 0.9 % 100 mL IVPB     2 g 200 mL/hr over 30 Minutes Intravenous On call to O.R. 01/04/18 1132 01/05/18 0559      Assessment/Plan: [name] is a 67 yo with has a SBO that is not resolving. Continues to have distention and NG output and SBFT with high grade obstruction. The patient is having some BMs which is good. EGD done today and concerning gastric ulcer which was biopsied.  The biopsy is on rush for pathology.   -NG NPO for now -Will get RN to document all BMs -Called pathology and should have some information back by 8-9AM tomorrow, will have to delay cause until then in order to fully be able to inform patient about possibilities of surgery     LOS: 3 days    Thomas [name] 01/04/2018

## 2018-01-05 ENCOUNTER — Inpatient Hospital Stay (HOSPITAL_COMMUNITY): Payer: Medicare Other | Admitting: Anesthesiology

## 2018-01-05 ENCOUNTER — Encounter (HOSPITAL_COMMUNITY)
Admission: EM | Disposition: A | Discharge status: Home / Self Care | Payer: Self-pay | Source: Home / Self Care | Attending: General Surgery

## 2018-01-05 ENCOUNTER — Encounter (HOSPITAL_COMMUNITY): Payer: Self-pay | Admitting: *Deleted

## 2018-01-05 DIAGNOSIS — N471 Phimosis: Secondary | ICD-10-CM

## 2018-01-05 HISTORY — PX: BOWEL RESECTION: SHX1257

## 2018-01-05 HISTORY — PX: LAPAROTOMY: SHX154

## 2018-01-05 HISTORY — PX: LYSIS OF ADHESION: SHX5961

## 2018-01-05 LAB — PHOSPHORUS: Phosphorus: 2.3 mg/dL — ABNORMAL LOW (ref 2.5–4.6)

## 2018-01-05 LAB — GLUCOSE, CAPILLARY
GLUCOSE-CAPILLARY: 90 mg/dL (ref 65–99)
Glucose-Capillary: 95 mg/dL (ref 65–99)
Glucose-Capillary: 105 mg/dL — ABNORMAL HIGH (ref 65–99)

## 2018-01-05 LAB — MAGNESIUM: Magnesium: 1.7 mg/dL (ref 1.7–2.4)

## 2018-01-05 SURGERY — LAPAROTOMY, EXPLORATORY
Anesthesia: General | Site: Abdomen

## 2018-01-05 MED ORDER — PROPOFOL 10 MG/ML IV BOLUS
INTRAVENOUS | Status: DC | PRN
  Administered 2018-01-05: 150 mg via INTRAVENOUS

## 2018-01-05 MED ORDER — ROCURONIUM 10MG/ML (10ML) SYRINGE FOR MEDFUSION PUMP - OPTIME
INTRAVENOUS | Status: DC | PRN
  Administered 2018-01-05: 30 mg via INTRAVENOUS
  Administered 2018-01-05 (×4): 10 mg via INTRAVENOUS
  Administered 2018-01-05: 20 mg via INTRAVENOUS
  Administered 2018-01-05: 10 mg via INTRAVENOUS
  Administered 2018-01-05: 20 mg via INTRAVENOUS

## 2018-01-05 MED ORDER — CEFOTETAN DISODIUM-DEXTROSE 2-2.08 GM-%(50ML) IV SOLR
INTRAVENOUS | Status: AC
  Filled 2018-01-05: qty 50

## 2018-01-05 MED ORDER — LACTATED RINGERS IV SOLN
INTRAVENOUS | Status: DC
  Administered 2018-01-06 – 2018-01-07 (×4): via INTRAVENOUS

## 2018-01-05 MED ORDER — SUCCINYLCHOLINE 20MG/ML (10ML) SYRINGE FOR MEDFUSION PUMP - OPTIME
INTRAMUSCULAR | Status: DC | PRN
  Administered 2018-01-05: 120 mg via INTRAVENOUS

## 2018-01-05 MED ORDER — OXYCODONE HCL 5 MG PO TABS
5.0000 mg | ORAL_TABLET | ORAL | Status: DC | PRN
  Administered 2018-01-05 – 2018-01-07 (×4): 10 mg via ORAL
  Filled 2018-01-05 (×2): qty 2
  Filled 2018-01-05: qty 1
  Filled 2018-01-05 (×4): qty 2

## 2018-01-05 MED ORDER — ENOXAPARIN SODIUM 40 MG/0.4ML ~~LOC~~ SOLN
40.0000 mg | SUBCUTANEOUS | Status: DC
  Administered 2018-01-06 – 2018-01-10 (×5): 40 mg via SUBCUTANEOUS
  Filled 2018-01-05 (×5): qty 0.4

## 2018-01-05 MED ORDER — ACETAMINOPHEN 10 MG/ML IV SOLN
1000.0000 mg | Freq: Once | INTRAVENOUS | Status: DC | PRN
  Administered 2018-01-05: 1000 mg via INTRAVENOUS
  Filled 2018-01-05: qty 100

## 2018-01-05 MED ORDER — MIDAZOLAM HCL 2 MG/2ML IJ SOLN: 0.5000 mg | Freq: Once | INTRAMUSCULAR | Status: DC | PRN

## 2018-01-05 MED ORDER — HYDROMORPHONE HCL 1 MG/ML IJ SOLN: 0.2500 mg | INTRAMUSCULAR | Status: DC | PRN

## 2018-01-05 MED ORDER — SODIUM CHLORIDE 0.9 % IV SOLN
2.0000 g | Freq: Two times a day (BID) | INTRAVENOUS | Status: AC
  Administered 2018-01-05 – 2018-01-06 (×2): 2 g via INTRAVENOUS
  Filled 2018-01-05 (×2): qty 2

## 2018-01-05 MED ORDER — SODIUM CHLORIDE 0.9 % IJ SOLN
INTRAMUSCULAR | Status: AC
  Filled 2018-01-05: qty 10

## 2018-01-05 MED ORDER — LIDOCAINE HCL (CARDIAC) PF 50 MG/5ML IV SOSY
PREFILLED_SYRINGE | INTRAVENOUS | Status: DC | PRN
  Administered 2018-01-05: 82.7 mg via INTRAVENOUS

## 2018-01-05 MED ORDER — FENTANYL CITRATE (PF) 100 MCG/2ML IJ SOLN
INTRAMUSCULAR | Status: DC | PRN
  Administered 2018-01-05 (×4): 50 ug via INTRAVENOUS
  Administered 2018-01-05: 100 ug via INTRAVENOUS
  Administered 2018-01-05 (×2): 50 ug via INTRAVENOUS

## 2018-01-05 MED ORDER — MIDAZOLAM HCL 5 MG/5ML IJ SOLN
INTRAMUSCULAR | Status: DC | PRN
  Administered 2018-01-05: 2 mg via INTRAVENOUS

## 2018-01-05 MED ORDER — ROCURONIUM BROMIDE 50 MG/5ML IV SOLN
INTRAVENOUS | Status: AC
  Filled 2018-01-05: qty 1

## 2018-01-05 MED ORDER — BUPIVACAINE LIPOSOME 1.3 % IJ SUSP
INTRAMUSCULAR | Status: DC | PRN
  Administered 2018-01-05: 20 mL

## 2018-01-05 MED ORDER — SIMETHICONE 80 MG PO CHEW
40.0000 mg | CHEWABLE_TABLET | Freq: Four times a day (QID) | ORAL | Status: DC | PRN
  Filled 2018-01-05: qty 1

## 2018-01-05 MED ORDER — LABETALOL HCL 5 MG/ML IV SOLN
INTRAVENOUS | Status: DC | PRN
  Administered 2018-01-05 (×4): 5 mg via INTRAVENOUS

## 2018-01-05 MED ORDER — NEOSTIGMINE METHYLSULFATE 10 MG/10ML IV SOLN
INTRAVENOUS | Status: DC | PRN
  Administered 2018-01-05: 4 mg via INTRAVENOUS

## 2018-01-05 MED ORDER — ROCURONIUM BROMIDE 50 MG/5ML IV SOLN
INTRAVENOUS | Status: AC
  Filled 2018-01-05: qty 2

## 2018-01-05 MED ORDER — ONDANSETRON HCL 4 MG/2ML IJ SOLN
INTRAMUSCULAR | Status: AC
  Filled 2018-01-05: qty 2

## 2018-01-05 MED ORDER — GLYCOPYRROLATE 0.2 MG/ML IJ SOLN
INTRAMUSCULAR | Status: DC | PRN
  Administered 2018-01-05: .7 mg via INTRAVENOUS

## 2018-01-05 MED ORDER — LIDOCAINE HCL (PF) 1 % IJ SOLN
INTRAMUSCULAR | Status: AC
  Filled 2018-01-05: qty 15

## 2018-01-05 MED ORDER — EPHEDRINE SULFATE 50 MG/ML IJ SOLN
INTRAMUSCULAR | Status: AC
  Filled 2018-01-05: qty 3

## 2018-01-05 MED ORDER — GLYCOPYRROLATE 0.2 MG/ML IJ SOLN
INTRAMUSCULAR | Status: AC
  Filled 2018-01-05: qty 8

## 2018-01-05 MED ORDER — LIDOCAINE HCL (PF) 1 % IJ SOLN
INTRAMUSCULAR | Status: AC
  Filled 2018-01-05: qty 5

## 2018-01-05 MED ORDER — LACTATED RINGERS IV SOLN
INTRAVENOUS | Status: DC
  Administered 2018-01-05: 11:00:00 via INTRAVENOUS
  Administered 2018-01-05: 1000 mL via INTRAVENOUS
  Administered 2018-01-05: 12:00:00 via INTRAVENOUS

## 2018-01-05 MED ORDER — METOPROLOL TARTRATE 5 MG/5ML IV SOLN
5.0000 mg | Freq: Four times a day (QID) | INTRAVENOUS | Status: DC | PRN
  Administered 2018-01-06 – 2018-01-08 (×2): 5 mg via INTRAVENOUS
  Filled 2018-01-05 (×3): qty 5

## 2018-01-05 MED ORDER — SUCCINYLCHOLINE CHLORIDE 20 MG/ML IJ SOLN
INTRAMUSCULAR | Status: AC
  Filled 2018-01-05: qty 1

## 2018-01-05 MED ORDER — MIDAZOLAM HCL 2 MG/2ML IJ SOLN
INTRAMUSCULAR | Status: AC
  Filled 2018-01-05: qty 2

## 2018-01-05 MED ORDER — KETOROLAC TROMETHAMINE 15 MG/ML IJ SOLN
15.0000 mg | Freq: Four times a day (QID) | INTRAMUSCULAR | Status: DC
  Administered 2018-01-05 – 2018-01-10 (×18): 15 mg via INTRAVENOUS
  Filled 2018-01-05 (×19): qty 1

## 2018-01-05 MED ORDER — PHENYLEPHRINE HCL 10 MG/ML IJ SOLN
INTRAMUSCULAR | Status: AC
  Filled 2018-01-05: qty 1

## 2018-01-05 MED ORDER — NEOSTIGMINE METHYLSULFATE 10 MG/10ML IV SOLN
INTRAVENOUS | Status: AC
  Filled 2018-01-05: qty 1

## 2018-01-05 MED ORDER — PROMETHAZINE HCL 25 MG/ML IJ SOLN: 6.2500 mg | INTRAMUSCULAR | Status: DC | PRN

## 2018-01-05 MED ORDER — SODIUM CHLORIDE 0.9 % IR SOLN
Status: DC | PRN
  Administered 2018-01-05: 1000 mL
  Administered 2018-01-05: 2000 mL
  Administered 2018-01-05: 1000 mL

## 2018-01-05 MED ORDER — PROPOFOL 10 MG/ML IV BOLUS
INTRAVENOUS | Status: AC
  Filled 2018-01-05: qty 20

## 2018-01-05 MED ORDER — BUPIVACAINE LIPOSOME 1.3 % IJ SUSP
INTRAMUSCULAR | Status: AC
  Filled 2018-01-05: qty 20

## 2018-01-05 MED ORDER — FENTANYL CITRATE (PF) 250 MCG/5ML IJ SOLN
INTRAMUSCULAR | Status: AC
  Filled 2018-01-05: qty 5

## 2018-01-05 SURGICAL SUPPLY — 49 items
BAG HAMPER (MISCELLANEOUS) ×3 IMPLANT
CLOTH BEACON ORANGE TIMEOUT ST (SAFETY) ×3 IMPLANT
COVER LIGHT HANDLE STERIS (MISCELLANEOUS) ×6 IMPLANT
DRAPE WARM FLUID 44X44 (DRAPE) ×3 IMPLANT
DRSG OPSITE POSTOP 4X10 (GAUZE/BANDAGES/DRESSINGS) ×3 IMPLANT
ELECT BLADE 6 FLAT ULTRCLN (ELECTRODE) IMPLANT
ELECT REM PT RETURN 9FT ADLT (ELECTROSURGICAL) ×3
ELECTRODE REM PT RTRN 9FT ADLT (ELECTROSURGICAL) ×1 IMPLANT
GAUZE SPONGE 4X4 12PLY STRL (GAUZE/BANDAGES/DRESSINGS) ×3 IMPLANT
GLOVE BIO SURGEON STRL SZ 6.5 (GLOVE) ×4 IMPLANT
GLOVE BIO SURGEONS STRL SZ 6.5 (GLOVE) ×3
GLOVE BIOGEL PI IND STRL 6.5 (GLOVE) ×1 IMPLANT
GLOVE BIOGEL PI IND STRL 7.0 (GLOVE) ×2 IMPLANT
GLOVE BIOGEL PI INDICATOR 6.5 (GLOVE) ×4
GLOVE BIOGEL PI INDICATOR 7.0 (GLOVE) ×12
GLOVE ECLIPSE 6.5 STRL STRAW (GLOVE) ×8 IMPLANT
GLOVE SURG SS PI 7.0 STRL IVOR (GLOVE) ×2 IMPLANT
GOWN STRL REUS W/TWL LRG LVL3 (GOWN DISPOSABLE) ×13 IMPLANT
HANDLE SUCTION POOLE (INSTRUMENTS) ×1 IMPLANT
INST SET MAJOR GENERAL (KITS) ×3 IMPLANT
KIT BLADEGUARD II DBL (SET/KITS/TRAYS/PACK) ×2 IMPLANT
KIT TURNOVER KIT A (KITS) ×3 IMPLANT
LIGASURE IMPACT 36 18CM CVD LR (INSTRUMENTS) ×3 IMPLANT
MANIFOLD NEPTUNE II (INSTRUMENTS) ×3 IMPLANT
NDL HYPO 18GX1.5 BLUNT FILL (NEEDLE) ×1 IMPLANT
NEEDLE HYPO 18GX1.5 BLUNT FILL (NEEDLE) ×3 IMPLANT
NS IRRIG 1000ML POUR BTL (IV SOLUTION) ×10 IMPLANT
PACK ABDOMINAL MAJOR (CUSTOM PROCEDURE TRAY) ×3 IMPLANT
PAD ARMBOARD 7.5X6 YLW CONV (MISCELLANEOUS) ×3 IMPLANT
RELOAD LINEAR CUT PROX 55 BLUE (ENDOMECHANICALS) ×3 IMPLANT
RELOAD STAPLE 55 3.8 BLU REG (ENDOMECHANICALS) IMPLANT
RETRACTOR WND ALEXIS 25 LRG (MISCELLANEOUS) IMPLANT
RTRCTR WOUND ALEXIS 25CM LRG (MISCELLANEOUS) ×3
SET BASIN LINEN APH (SET/KITS/TRAYS/PACK) ×3 IMPLANT
SPONGE LAP 18X18 X RAY DECT (DISPOSABLE) ×3 IMPLANT
STAPLER GUN LINEAR PROX 60 (STAPLE) ×2 IMPLANT
STAPLER PROXIMATE 55 BLUE (STAPLE) ×4 IMPLANT
STAPLER VISISTAT (STAPLE) ×3 IMPLANT
SUCTION POOLE HANDLE (INSTRUMENTS) ×3
SUT CHROMIC 0 SH (SUTURE) IMPLANT
SUT CHROMIC 2 0 SH (SUTURE) IMPLANT
SUT PDS AB 0 CTX 60 (SUTURE) IMPLANT
SUT PDS AB CT VIOLET #0 27IN (SUTURE) ×4 IMPLANT
SUT SILK 2 0 (SUTURE)
SUT SILK 2-0 18XBRD TIE 12 (SUTURE) ×1 IMPLANT
SUT SILK 3 0 SH CR/8 (SUTURE) ×10 IMPLANT
SYR 20CC LL (SYRINGE) ×3 IMPLANT
TRAY FOLEY W/BAG SLVR 16FR (SET/KITS/TRAYS/PACK) ×3
TRAY FOLEY W/BAG SLVR 16FR ST (SET/KITS/TRAYS/PACK) IMPLANT

## 2018-01-05 NOTE — Anesthesia Postprocedure Evaluation (Signed)
Anesthesia Post Note  Patient: Thomas [name]  Procedure(s) Performed: EXPLORATORY LAPAROTOMY (N/A Abdomen) EXTENSIVE LYSIS OF ADHESIONS (N/A Abdomen) SMALL BOWEL RESECTION (N/A Abdomen)  Patient location during evaluation: PACU Anesthesia Type: General Level of consciousness: awake and sedated Pain management: pain level controlled Vital Signs Assessment: post-procedure vital signs reviewed and stable Respiratory status: spontaneous breathing, nonlabored ventilation, respiratory function stable and patient connected to face mask oxygen Cardiovascular status: blood pressure returned to baseline and stable Postop Assessment: no apparent nausea or vomiting Anesthetic complications: no     Last Vitals:  Vitals:   01/05/18 0945 01/05/18 0950  BP: (!) 157/99 (!) 158/101  Pulse:    Resp: 20 (!) 25  Temp:    SpO2: 94% 94%    Last Pain:  Vitals:   01/05/18 1200  TempSrc: Oral  PainSc: 0-No pain                 Allena EaringJeffrey C Rube Sanchez

## 2018-01-05 NOTE — Progress Notes (Signed)
Patient's CBG not crossing over from machine, CBG 90 at 0600.

## 2018-01-05 NOTE — Progress Notes (Signed)
Discussed path with Dr. Luisa HartPatrick.;  benign peptic ulcer disease /inflammation. Stains pending for H. pylori. Suggest twice a day PPI therapy for the next 3 months;  repeat EGD. Avoidance of nonsteroidal agents.

## 2018-01-05 NOTE — Anesthesia Preprocedure Evaluation (Signed)
Anesthesia Evaluation  Patient identified by MRN, date of birth, ID band Patient awake    Reviewed: Allergy & Precautions, H&P , NPO status , Patient's Chart, lab work & pertinent test results, reviewed documented beta blocker date and time   Airway Mallampati: III  TM Distance: >3 FB Neck ROM: limited    Dental no notable dental hx. (+) Teeth Intact, Poor Dentition, Chipped   Pulmonary neg pulmonary ROS,    Pulmonary exam normal breath sounds clear to auscultation       Cardiovascular Exercise Tolerance: Good hypertension, negative cardio ROS   Rhythm:regular Rate:Normal     Neuro/Psych negative neurological ROS  negative psych ROS   GI/Hepatic negative GI ROS, Neg liver ROS,   Endo/Other  negative endocrine ROS  Renal/GU negative Renal ROS  negative genitourinary   Musculoskeletal   Abdominal   Peds  Hematology negative hematology ROS (+)   Anesthesia Other Findings Spoke to patient and caregiver; no clinical complaints  Echo reviewed from 6/18 EKG reviewed from 12/19/17... NSR with no acute changes H/O dementia  Reproductive/Obstetrics negative OB ROS                             Anesthesia Physical Anesthesia Plan  ASA: III  Anesthesia Plan: General   Post-op Pain Management:    Induction:   PONV Risk Score and Plan:   Airway Management Planned:   Additional Equipment:   Intra-op Plan:   Post-operative Plan:   Informed Consent: I have reviewed the patients History and Physical, chart, labs and discussed the procedure including the risks, benefits and alternatives for the proposed anesthesia with the patient or authorized representative who has indicated his/her understanding and acceptance.   Dental Advisory Given  Plan Discussed with: CRNA  Anesthesia Plan Comments:         Anesthesia Quick Evaluation

## 2018-01-05 NOTE — Interval H&P Note (Signed)
History and Physical Interval Note:  01/05/2018 9:25 AM  Thomas [name]  has presented today for surgery, with the diagnosis of sbo  The various methods of treatment have been discussed with the patient and family. After consideration of risks, benefits and other options for treatment, the patient has consented to  Procedure(s): EXPLORATORY LAPAROTOMY, POSSIBLE LYSIS OF ADHESIONS, POSSIBLE SMALL BOWEL RESECTION (N/A) as a surgical intervention .  The patient's history has been reviewed, patient examined, no change in status, stable for surgery.  I have reviewed the patient's chart and labs.  Questions were answered to the patient's satisfaction.    No major questions. Spoke with caregiver, nephew Thomas [name] in person regarding surgery and risk including bleeding, infection, risk of leak or repeat surgery.  Biospies are consistent with ulcer in the stomach and not dysplastic or malignant cells in the specimen.  Proceeding with surgery for chronic partial to complete SBO that has been going on for several weeks and not improving.  Although having some Bms, these are small, SBFT showed obstruction and Xrays have demonstrated this after SBFT too.   Informed patient Dr. Lovell [name] would be seeing him after surgery through holiday weekend.   Thomas [name]

## 2018-01-05 NOTE — Progress Notes (Signed)
Pt stated he would rather wait until the morning to walk. Will pass on to day shift. Will continue to monitor pt

## 2018-01-05 NOTE — Op Note (Signed)
[facility] Operative Note  01/05/18  Preoperative Diagnosis: Small bowel obstruction    Postoperative Diagnosis: Small bowel obstruction with partial closed loop; Phimosis of the penis (difficulty in pulling back foreskin)    Procedure(s) Performed: Exploratory laparotomy, lysis of adhesions for 2 hours, small bowel resection with anastomosis    Surgeon: Leatrice JewelsLindsay C. Henreitta LeberBridges, MD   Assistants: No qualified resident was available   Anesthesia: General endotracheal   Anesthesiologist: Dr. Mayford KnifeWilliams, MD   Specimens:  Small bowel (40-50cm)    Estimated Blood Loss: 100cc    Blood Replacement: None    Complications: None   Wound Class: Contaminated (enterotomy with some spillage of enteric contents)    Operative Indications: Thomas [name] is a 67 yo with a history of 3 weeks of abdominal pain, nausea/vomiting, who has been seen in the ED multiple times with findings initially for enterocolitis and then on his subsequent encounters was more consistent with a small bowel obstruction. He has continued to have intermittent small bowel movements but has consistently had evidence of obstruction on his SBFT and Xrays.  He also had some thickening of his antrum and underwent and EGD with Dr. Jena Gaussourk, a wide base ulcer was identified and biopsied, and these were non malignant/ non dysplastic samples.  After a discussion of the risk and benefits of surgery including bleeding, infection, risk of leak from anastomosis, and risk of injury to other organs he opted to proceed.   Findings: Extensive adhesions of the small bowel to the anterior abdominal wall and adhesions between loops of bowel, multiple points of partial obstruction with decompressed small bowel proximal and distal and a large loop of mid ileum that was dilated, the junction between decompressed and dilated bowel distally was thickened and had to be resected    Procedure: The patient was taken to the operating room and placed  supine. General endotracheal anesthesia was induced. Intravenous antibiotics were administered per protocol.  A nasogastric tube was already in position to decompress the stomach. A foley catheter was placed, and the patient was noted to have some phimosis. The foreskin was eventually retracted back and the glands was cleaned, and the foley was placed under sterile conditions. The foreskin was pulled back down over the glands.  The abdomen was prepared and draped in the usual sterile fashion.    A midline celiotomy was made through his original scar, and the peritoneum was entered sharply. We encountered extensive adhesions to the anterior abdominal wall with small bowel and omentum adherent to the scar and to the peritoneum surrounding the scar.    Extensive lysis of adhesions were done using sharp, blunt, and cautery dissection.  The lysis of adhesions in total was over 2 hours. The adhesions were to the anterior abdominal wall with the peritoneum and underlying omentum and bowel almost being fused in some places.  Several serosal tears were made and these were closed with lemberted 3-0 Silk sutures as these were created.    There were multiple loops of bowels that had extensive adhesions between the bowel and the mesentery.  The proximal small bowel from the ligament of treitz was decompressed and then the bowel became dilated in the mid jejunum to distal ileum where it was again decompressed.  A loop of mid ileum was adhered to the abdominal wall in the right upper quadrant and was extensively dilated to over 6 cm in places.  This bowel was very fragile, and a serosal tear was created. When trying to repair  this an enterotomy was placed through a pinpoint needle puncture, and there was spillage of enteric contents. These were suctioned out, and the small bowel was closed with a 3-0 lemberted silk to control the spillage of contents.    The bowel was finally freed up enough from the anterior abdominal  wall to run it from the terminal ileum to the ligament of treitz. The terminal ileum and prior anastomosis were decompressed and open. In the mid ileum/ distal jejunum there was dilated bowel as described.  The majority of the serosal tears were in the this section and the enterotomy was in this section of bowel.  From the mid jejunum on proximally the bowel was decompressed. This was consistent with a partial closed loop obstruction given the proximal and distal decompressed bowel.  There were multiple adhesions between the loops of bowel causing partial obstructions, and in the distal portion where the bowel went from dilated to decompressed there was a thickened segment.  Given the thickness, the bowel did not decompress through the segment easily, and it was determined that this would need to be resected.  A linear cutting stapler was used to transect the bowel distally past the point of thickening, and proximally in the more decompressed bowel.  The majority of the serosal tears and the enterotomy were resected in this specimen that was about 40-50cm. The mesentery was taken with a Ligasure device.    The proximal enteric contents were milked back up to the stomach and the NG tube was confirmed, but due to its small size, it was replaced with a larger NG to evacuate the contents.  During this time, a small air bubble was noted.  It was difficult to tell if this was from the air trapped behind the liver or if this was from the stomach.  The upper abdomen was irrigated and the stomach was insufflated with air, and no further air bubbles were noted. No succus/ bile was noted.    The entire colon was inspected and was healthy without signs of any injury. The stomach was thickened at the pylorus but no obvious masses were appreciated, no adenopathy, and no liver lesions palpated.   The small bowel was anastomosed in a side to side fashion after laparotomy pads were placed.  The linear cutting stapler was  used to create the common channel and a TIA was used to close the enterotomy.  The crotch stitch and staple line were oversewn with 3-0 Silk suture. The mesenteric defect was closed with 3-0 Vicryl.  The small bowel was ran a third and fourth time in the entirety.  The remaining serosal tears were inspected and closed. There was no signs of any leakage. The bowel was healthy. Everything was placed back into the abdomen in proper anatomic position. The abdomen was irrigated.   The fascia was closed with 0 PDS sutures, and the skin was closed with staples. A sterile dressing was placed.    Final inspection revealed acceptable hemostasis. All counts were correct at the end of the case. The patient was awakened from anesthesia and extubate without complication.  The patient went to the PACU in stable condition.   Thomas Greenhouse, MD [facility] [address] [place], [place] [address] [phone] (office)

## 2018-01-05 NOTE — Progress Notes (Signed)
Late entry  Patient's CBG continues to drop, 62 at 2310, patient remains NPO. MD made aware, orders placed and followed. Will continue to monitor.

## 2018-01-05 NOTE — Progress Notes (Signed)
[facility]  Doing well post op. Feeling ok. No major complaints. NG  To suction. No Bowel function.  IS not in room. RN going to get.  Called and left [name], nephrew a message about surgery.  Bowel resection and some repaired serosal tears. Will need to be having Bms before NG removal.  Hospitalist signed off.   Algis GreenhouseLindsay Bridges, MD [facility] [address] [place], [place] [id number] [phone] (office)

## 2018-01-05 NOTE — Transfer of Care (Signed)
Immediate Anesthesia Transfer of Care Note  Patient: Thomas [name]  Procedure(s) Performed: EXPLORATORY LAPAROTOMY (N/A Abdomen) EXTENSIVE LYSIS OF ADHESIONS (N/A Abdomen) SMALL BOWEL RESECTION (N/A Abdomen)  Patient Location: PACU  Anesthesia Type:General  Level of Consciousness: awake  Airway & Oxygen Therapy: Patient Spontanous Breathing and Patient connected to face mask oxygen  Post-op Assessment: Report given to RN  Post vital signs: Reviewed and stable  Last Vitals:  Vitals Value Taken Time  BP 161/111 01/05/2018  1:45 PM  Temp    Pulse 89 01/05/2018  1:49 PM  Resp 15 01/05/2018  1:46 PM  SpO2 99 % 01/05/2018  1:49 PM  Vitals shown include unvalidated device data.  Last Pain:  Vitals:   01/05/18 1200  TempSrc: Oral  PainSc: 0-No pain      Patients Stated Pain Goal: 9 (01/05/18 [id number])  Complications: No apparent anesthesia complications

## 2018-01-05 NOTE — Progress Notes (Signed)
[facility]  Awaiting rush pathology from EGD. Called pathology and [facility]. Will be somewhat delayed in reading this AM.   Pathologist knows to notify me with results as this could effect surgery.  Thomas GreenhouseLindsay Liyana Suniga, MD [facility] [address] [place], [place] [phone] (office)

## 2018-01-05 NOTE — Anesthesia Procedure Notes (Signed)
Procedure Name: Intubation Date/Time: 01/05/2018 10:08 AM Performed by: Ollen Bowl, CRNA Pre-anesthesia Checklist: Patient identified, Patient being monitored, Timeout performed, Emergency Drugs available and Suction available Patient Re-evaluated:Patient Re-evaluated prior to induction Oxygen Delivery Method: Circle system utilized Preoxygenation: Pre-oxygenation with 100% oxygen Induction Type: IV induction Ventilation: Mask ventilation without difficulty Laryngoscope Size: Mac and 4 Grade View: Grade I Tube type: Oral Tube size: 8.0 mm Number of attempts: 1 Airway Equipment and Method: Stylet Placement Confirmation: ETT inserted through vocal cords under direct vision,  positive ETCO2 and breath sounds checked- equal and bilateral Secured at: 21 cm Tube secured with: Tape Dental Injury: Teeth and Oropharynx as per pre-operative assessment

## 2018-01-05 NOTE — Anesthesia Postprocedure Evaluation (Signed)
Anesthesia Post Note  Patient: Thomas [name]  Procedure(s) Performed: EXPLORATORY LAPAROTOMY (N/A Abdomen) EXTENSIVE LYSIS OF ADHESIONS (N/A Abdomen) SMALL BOWEL RESECTION (N/A Abdomen)  Patient location during evaluation: PACU Anesthesia Type: General Level of consciousness: awake and alert and oriented Pain management: pain level controlled Vital Signs Assessment: post-procedure vital signs reviewed and stable Respiratory status: spontaneous breathing Cardiovascular status: blood pressure returned to baseline and stable Postop Assessment: no apparent nausea or vomiting Anesthetic complications: no     Last Vitals:  Vitals:   01/05/18 1415 01/05/18 1430  BP: 92/61 99/79  Pulse: 83 87  Resp: 18 17  Temp:    SpO2:      Last Pain:  Vitals:   01/05/18 1415  TempSrc:   PainSc: 0-No pain                 Tonie Vizcarrondo

## 2018-01-05 NOTE — Progress Notes (Signed)
Pts oxygen level decreased to 1.5L/[place] to try and wean pt off O2. Will continue to monitor pt

## 2018-01-06 ENCOUNTER — Encounter: Payer: Self-pay | Admitting: Internal Medicine

## 2018-01-06 LAB — GLUCOSE, CAPILLARY
GLUCOSE-CAPILLARY: 106 mg/dL — AB (ref 65–99)
GLUCOSE-CAPILLARY: 93 mg/dL (ref 65–99)
GLUCOSE-CAPILLARY: 98 mg/dL (ref 65–99)
Glucose-Capillary: 103 mg/dL — ABNORMAL HIGH (ref 65–99)
Glucose-Capillary: 105 mg/dL — ABNORMAL HIGH (ref 65–99)

## 2018-01-06 LAB — CBC
HCT: 33.4 % — ABNORMAL LOW (ref 39.0–52.0)
Hemoglobin: 10.9 g/dL — ABNORMAL LOW (ref 13.0–17.0)
MCH: 30.5 pg (ref 26.0–34.0)
MCHC: 32.6 g/dL (ref 30.0–36.0)
MCV: 93.6 fL (ref 78.0–100.0)
PLATELETS: 276 10*3/uL (ref 150–400)
RBC: 3.57 MIL/uL — ABNORMAL LOW (ref 4.22–5.81)
RDW: 13.4 % (ref 11.5–15.5)
WBC: 11.4 10*3/uL — ABNORMAL HIGH (ref 4.0–10.5)

## 2018-01-06 LAB — BASIC METABOLIC PANEL
Anion gap: 9 (ref 5–15)
BUN: 8 mg/dL (ref 6–20)
CALCIUM: 8.2 mg/dL — AB (ref 8.9–10.3)
CO2: 24 mmol/L (ref 22–32)
Chloride: 106 mmol/L (ref 101–111)
Creatinine, Ser: 1.03 mg/dL (ref 0.61–1.24)
GFR calc Af Amer: >60 mL/min (ref >60)
GLUCOSE: 119 mg/dL — AB (ref 65–99)
POTASSIUM: 3.8 mmol/L (ref 3.5–5.1)
Sodium: 139 mmol/L (ref 135–145)

## 2018-01-06 LAB — PHOSPHORUS: Phosphorus: 2.9 mg/dL (ref 2.5–4.6)

## 2018-01-06 LAB — MAGNESIUM: Magnesium: 1.3 mg/dL — ABNORMAL LOW (ref 1.7–2.4)

## 2018-01-06 LAB — MRSA PCR SCREENING: MRSA by PCR: NEGATIVE

## 2018-01-06 MED ORDER — MAGNESIUM SULFATE 4 GM/100ML IV SOLN
4.0000 g | Freq: Once | INTRAVENOUS | Status: AC
  Administered 2018-01-06: 4 g via INTRAVENOUS
  Filled 2018-01-06: qty 100

## 2018-01-06 NOTE — Addendum Note (Signed)
Addendum  created 01/06/18 1430 by Moshe Salisburyaniel, Lyrik Buresh E, CRNA   Delete clinical note

## 2018-01-06 NOTE — Progress Notes (Signed)
1 Day Post-Op  Subjective: Patient resting comfortably.  States his incisional pain is well controlled.  Objective: Vital signs in last 24 hours: Temp:  [97.9 F (36.6 C)-99.7 F (37.6 C)] 99.5 F (37.5 C) (04/19 0430) Pulse Rate:  [73-98] 74 (04/19 0600) Resp:  [13-25] 19 (04/19 0600) BP: (92-183)/(61-120) 109/75 (04/19 0600) SpO2:  [92 %-100 %] 98 % (04/19 0600) Weight:  [187 lb 2.7 oz (84.9 kg)-194 lb 0.1 oz (88 kg)] 187 lb 2.7 oz (84.9 kg) (04/19 0500) Last BM Date: 01/05/18  Intake/Output from previous day: 04/18 0701 - 04/19 0700 In: 4516.7 [I.V.:4316.7; IV Piggyback:200] Out: 1325 [Urine:1075; Emesis/NG output:100; Blood:150] Intake/Output this shift: No intake/output data recorded.  General appearance: alert, cooperative and no distress Resp: clear to auscultation bilaterally Cardio: regular rate and rhythm, S1, S2 normal, no murmur, click, rub or gallop GI: Soft, no bowel sounds appreciated.  Incision healing well.  Lab Results:  Recent Labs    01/04/18 0618 01/06/18 0449  WBC 9.7 11.4*  HGB 11.9* 10.9*  HCT 37.2* 33.4*  PLT 343 276   BMET Recent Labs    01/04/18 0618 01/06/18 0449  NA 143 139  K 3.7 3.8  CL 113* 106  CO2 19* 24  GLUCOSE 99 119*  BUN 10 8  CREATININE 0.93 1.03  CALCIUM 8.9 8.2*   PT/INR No results for input(s): LABPROT, INR in the last 72 hours.  Studies/Results: Dg Abd Portable 1v  Result Date: 01/04/2018 CLINICAL DATA:  NG tube placement EXAM: PORTABLE ABDOMEN - 1 VIEW COMPARISON:  KUB of 01/04/2018, and CT abdomen pelvis of 01/01/2018 FINDINGS: There is contrast throughout the nondistended colon. NG tube tip lies in the region of the expected antrum of the stomach. As noted previously a loop of small bowel again is filled with air in the right abdomen possibly indicating persistent partial small bowel obstruction. No opaque calculi are noted. There are degenerative changes in the mid to lower lumbar spine. IMPRESSION: 1. Tip of  NG tube lies in the expected region of the distal antrum of the stomach. 2. No change in gaseous distention of small bowel in the right abdomen which may indicate persistent partial small bowel obstruction. Electronically Signed   By: Dwyane DeePaul  Barry M.D.   On: 01/04/2018 12:23    Anti-infectives: Anti-infectives (From admission, onward)   Start     Dose/Rate Route Frequency Ordered Stop   01/05/18 2200  cefoTEtan (CEFOTAN) 2 g in sodium chloride 0.9 % 100 mL IVPB     2 g 200 mL/hr over 30 Minutes Intravenous Every 12 hours 01/05/18 1527 01/06/18 2159   01/05/18 0900  cefoTEtan (CEFOTAN) 2 g in sodium chloride 0.9 % 100 mL IVPB     2 g 200 mL/hr over 30 Minutes Intravenous On call to O.R. 01/04/18 1449 01/05/18 2037   01/04/18 1145  cefoTEtan (CEFOTAN) 2 g in sodium chloride 0.9 % 100 mL IVPB  Status:  Discontinued     2 g 200 mL/hr over 30 Minutes Intravenous On call to O.R. 01/04/18 1132 01/04/18 1448      Assessment/Plan: s/p Procedure(s): EXPLORATORY LAPAROTOMY EXTENSIVE LYSIS OF ADHESIONS SMALL BOWEL RESECTION Impression: Stable on postoperative day 1.  Hypomagnesemia noted and will be addressed.  Continue NG tube.  Continue Foley placement to monitor urine output.  Continue ICU monitoring.  LOS: 5 days    Thomas [name] 01/06/2018

## 2018-01-06 NOTE — Care Management Note (Signed)
Case Management Note  Patient Details  Name: Thomas [name] MRN: [id number] Date of Birth: Jan 07, 1951  Subjective/Objective:   Adm with SBO. From home wih nephew, who is a paid caregiver. Nephew assists with bathing and dressing. Patient has no DME or HH pta. We discuss if he needs HH RN at DC, he says he is agreeable is MD feels it is needed.                  Action/Plan: CM following. [facility] Patient agreeable.    Expected Discharge Date: unk                 Expected Discharge Plan:     In-House Referral:     Discharge planning Services  CM Consult  Post Acute Care Choice:    Choice offered to:     DME Arranged:    DME Agency:     HH Arranged:    HH Agency:     Status of Service:  In process, will continue to follow  If discussed at Long Length of Stay Meetings, dates discussed:    Additional Comments:  Marigny Borre, Chrystine OilerSharley Diane, RN 01/06/2018, 12:48 PM

## 2018-01-06 NOTE — Progress Notes (Signed)
Pt refusing to turn at this time-RN educated pt on importance of turning at least every 2 hours to prevent skin breakdown. Pt verbalized understanding but stated he was comfortable. Pt placed on RA d/t O2 sats 98% on 1L O2 per [place]. Will continue to monitor pt

## 2018-01-06 NOTE — Progress Notes (Signed)
Patient bathed today, tolerated well. Patient continues to use incentive spirometer Q1H 5 to 10 reps. Patient instructed on how to splint ABD incision with pillow when coughing, walking, and during any time pressure is exerted throughout the ABD. Patient ambulated 1 lap around ICU unit this shift, tolerated well and denies any c/o increased pain, SOB, calf pain, chest pain, etc. Foley catheter & NG tube remains indwelling & in place at this time as directed per Dr.Jenkins. WIll continue to monitor.

## 2018-01-06 NOTE — Addendum Note (Signed)
Addendum  created 01/06/18 1409 by Moshe Salisburyaniel, Somtochukwu Woollard E, CRNA   Sign clinical note

## 2018-01-06 NOTE — Anesthesia Postprocedure Evaluation (Deleted)
Anesthesia Post Note  Patient: Thomas [name]  Procedure(s) Performed: EXPLORATORY LAPAROTOMY (N/A Abdomen) EXTENSIVE LYSIS OF ADHESIONS (N/A Abdomen) SMALL BOWEL RESECTION (N/A Abdomen)  Patient location during evaluation: PACU Anesthesia Type: General Level of consciousness: awake and alert and oriented Pain management: pain level controlled Vital Signs Assessment: post-procedure vital signs reviewed and stable Respiratory status: spontaneous breathing Cardiovascular status: blood pressure returned to baseline and stable Postop Assessment: no apparent nausea or vomiting Anesthetic complications: no     Last Vitals:  Vitals:   01/06/18 1202 01/06/18 1300  BP: 136/87 132/87  Pulse: 93 92  Resp: (!) 21 20  Temp:    SpO2: 96% 95%    Last Pain:  Vitals:   01/06/18 1330  TempSrc:   PainSc: 6                  Arnett Duddy

## 2018-01-07 LAB — CBC
HEMATOCRIT: 30.8 % — AB (ref 39.0–52.0)
Hemoglobin: 10.2 g/dL — ABNORMAL LOW (ref 13.0–17.0)
MCH: 30.6 pg (ref 26.0–34.0)
MCHC: 33.1 g/dL (ref 30.0–36.0)
MCV: 92.5 fL (ref 78.0–100.0)
Platelets: 250 10*3/uL (ref 150–400)
RBC: 3.33 MIL/uL — AB (ref 4.22–5.81)
RDW: 13.1 % (ref 11.5–15.5)
WBC: 10.2 10*3/uL (ref 4.0–10.5)

## 2018-01-07 LAB — GLUCOSE, CAPILLARY
GLUCOSE-CAPILLARY: 100 mg/dL — AB (ref 65–99)
Glucose-Capillary: 95 mg/dL (ref 65–99)
Glucose-Capillary: 97 mg/dL (ref 65–99)

## 2018-01-07 LAB — BASIC METABOLIC PANEL
Anion gap: 12 (ref 5–15)
BUN: 9 mg/dL (ref 6–20)
CHLORIDE: 107 mmol/L (ref 101–111)
CO2: 22 mmol/L (ref 22–32)
CREATININE: 0.84 mg/dL (ref 0.61–1.24)
Calcium: 8.3 mg/dL — ABNORMAL LOW (ref 8.9–10.3)
GFR calc Af Amer: >60 mL/min (ref >60)
GFR calc non Af Amer: >60 mL/min (ref >60)
Glucose, Bld: 95 mg/dL (ref 65–99)
POTASSIUM: 3.2 mmol/L — AB (ref 3.5–5.1)
Sodium: 141 mmol/L (ref 135–145)

## 2018-01-07 LAB — PHOSPHORUS: Phosphorus: 1.9 mg/dL — ABNORMAL LOW (ref 2.5–4.6)

## 2018-01-07 LAB — MAGNESIUM: Magnesium: 1.6 mg/dL — ABNORMAL LOW (ref 1.7–2.4)

## 2018-01-07 MED ORDER — DEXTROSE 5 % IV SOLN
20.0000 mmol | Freq: Once | INTRAVENOUS | Status: AC
  Administered 2018-01-07: 20 mmol via INTRAVENOUS
  Filled 2018-01-07: qty 6.67

## 2018-01-07 MED ORDER — BISACODYL 10 MG RE SUPP
10.0000 mg | Freq: Two times a day (BID) | RECTAL | Status: DC
Start: 2018-01-07 — End: 2018-01-09
  Administered 2018-01-07 – 2018-01-08 (×4): 10 mg via RECTAL
  Filled 2018-01-07 (×4): qty 1

## 2018-01-07 MED ORDER — MAGNESIUM SULFATE 4 GM/100ML IV SOLN
4.0000 g | Freq: Once | INTRAVENOUS | Status: AC
  Administered 2018-01-07: 4 g via INTRAVENOUS
  Filled 2018-01-07: qty 100

## 2018-01-07 MED ORDER — KCL IN DEXTROSE-NACL 40-5-0.45 MEQ/L-%-% IV SOLN
INTRAVENOUS | Status: DC
  Administered 2018-01-07 – 2018-01-09 (×4): via INTRAVENOUS

## 2018-01-07 NOTE — Progress Notes (Signed)
2 Days Post-Op  Subjective: Patient comfortable.  States he sometimes feels his heart is racing.  Denies any chest pain.  Objective: Vital signs in last 24 hours: Temp:  [98.8 F (37.1 C)-99.9 F (37.7 C)] 99.9 F (37.7 C) (04/20 0754) Pulse Rate:  [75-112] 83 (04/20 0700) Resp:  [20-26] 20 (04/20 0700) BP: (109-160)/(73-115) 136/97 (04/20 0700) SpO2:  [91 %-96 %] 93 % (04/20 0400) Last BM Date: 01/05/18  Intake/Output from previous day: 04/19 0701 - 04/20 0700 In: 2400 [I.V.:2400] Out: 275 [Urine:275] Intake/Output this shift: No intake/output data recorded.  General appearance: alert, cooperative and no distress Resp: clear to auscultation bilaterally Cardio: regular rate and rhythm, S1, S2 normal, no murmur, click, rub or gallop GI: Soft, slightly bloated.  Not tense.  No rigidity noted.  Incision healing well.  Minimal bowel sounds appreciated.  Lab Results:  Recent Labs    01/06/18 0449 01/07/18 0428  WBC 11.4* 10.2  HGB 10.9* 10.2*  HCT 33.4* 30.8*  PLT 276 250   BMET Recent Labs    01/06/18 0449 01/07/18 0428  NA 139 141  K 3.8 3.2*  CL 106 107  CO2 24 22  GLUCOSE 119* 95  BUN 8 9  CREATININE 1.03 0.84  CALCIUM 8.2* 8.3*   PT/INR No results for input(s): LABPROT, INR in the last 72 hours.  Studies/Results: No results found.  Anti-infectives: Anti-infectives (From admission, onward)   Start     Dose/Rate Route Frequency Ordered Stop   01/05/18 2200  cefoTEtan (CEFOTAN) 2 g in sodium chloride 0.9 % 100 mL IVPB     2 g 200 mL/hr over 30 Minutes Intravenous Every 12 hours 01/05/18 1527 01/06/18 1153   01/05/18 0900  cefoTEtan (CEFOTAN) 2 g in sodium chloride 0.9 % 100 mL IVPB     2 g 200 mL/hr over 30 Minutes Intravenous On call to O.R. 01/04/18 1449 01/05/18 2037   01/04/18 1145  cefoTEtan (CEFOTAN) 2 g in sodium chloride 0.9 % 100 mL IVPB  Status:  Discontinued     2 g 200 mL/hr over 30 Minutes Intravenous On call to O.R. 01/04/18 1132  01/04/18 1448      Assessment/Plan: s/p Procedure(s): EXPLORATORY LAPAROTOMY EXTENSIVE LYSIS OF ADHESIONS SMALL BOWEL RESECTION Impression: Stable on postoperative day 2.  Patient does have hypomagnesemia as well as hypophosphatemia.  His urine output has improved.  Mild hypokalemia is noted and this will be addressed. He occasionally has PVC's.  Continue Foley until urine output stable.  LOS: 6 days    Franky MachoMark Tamra Koos 01/07/2018

## 2018-01-07 NOTE — Progress Notes (Signed)
MEDICATION RELATED CONSULT NOTE - INITIAL   Pharmacy Consult for Phosphorous replacement Indication: hypophosphatemia  No Known Allergies  Patient Measurements: Height: 5\' 8"  (172.7 cm) Weight: 187 lb 2.7 oz (84.9 kg) IBW/kg (Calculated) : 68.4  Vital Signs: Temp: 99.9 F (37.7 C) (04/20 0754) Temp Source: Oral (04/20 0754) BP: 136/97 (04/20 0700) Pulse Rate: 83 (04/20 0700) Intake/Output from previous day: 04/19 0701 - 04/20 0700 In: 2400 [I.V.:2400] Out: 275 [Urine:275] Intake/Output from this shift: No intake/output data recorded.  Labs: Recent Labs    01/05/18 0629 01/06/18 0449 01/07/18 0428  WBC  --  11.4* 10.2  HGB  --  10.9* 10.2*  HCT  --  33.4* 30.8*  PLT  --  276 250  CREATININE  --  1.03 0.84  MG 1.7 1.3* 1.6*  PHOS 2.3* 2.9 1.9*   Estimated Creatinine Clearance: 88 mL/min (by C-G formula based on SCr of 0.84 mg/dL).  Medical History: Past Medical History:  Diagnosis Date  . AAA (abdominal aortic aneurysm) (HCC)    Repaired in the 90s  . Altered mental status   . Arthritis   . Confusion   . Hypertension   . Memory loss   . Seizures (HCC)    years ago no med now  . Spinal stenosis    Medications:  Medications Prior to Admission  Medication Sig Dispense Refill Last Dose  . amLODipine (NORVASC) 10 MG tablet Take 10 mg by mouth daily.   01/01/2018 at Unknown time  . aspirin 81 MG tablet Take 81 mg by mouth daily.   01/01/2018 at Unknown time  . bisoprolol (ZEBETA) 5 MG tablet Take 1 tablet (5 mg total) daily by mouth.   01/01/2018 at 900a  . donepezil (ARICEPT) 5 MG tablet Take 1 tablet (5 mg total) by mouth at bedtime. 30 tablet 6 12/31/2017 at Unknown time  . loperamide (IMODIUM) 2 MG capsule Take 1 capsule (2 mg total) by mouth 4 (four) times daily as needed for diarrhea or loose stools. 12 capsule 0 Past Month at Unknown time  . meclizine (ANTIVERT) 25 MG tablet Take 25 mg by mouth 3 (three) times daily as needed for dizziness or nausea. For 10  days  2 unknown  . meloxicam (MOBIC) 7.5 MG tablet Take 7.5 mg 2 (two) times daily by mouth.   01/01/2018 at Unknown time  . mirtazapine (REMERON) 7.5 MG tablet Take 7.5 mg at bedtime by mouth.  11 12/31/2017 at Unknown time  . omeprazole (PRILOSEC) 20 MG capsule Take 20 mg by mouth 2 (two) times daily.    01/01/2018 at Unknown time  . potassium chloride SA (K-DUR,KLOR-CON) 20 MEQ tablet Take 1 tablet (20 mEq total) daily by mouth. (Patient taking differently: Take 20 mEq by mouth 3 (three) times daily. ) 20 tablet 0 01/01/2018 at Unknown time  . sucralfate (CARAFATE) 1 GM/10ML suspension Take 10 mLs (1 g total) by mouth 4 (four) times daily -  with meals and at bedtime. 420 mL 0 01/01/2018 at Unknown time  . topiramate (TOPAMAX) 25 MG tablet Take 1 tablet (25 mg total) by mouth 2 (two) times daily. 180 tablet 3 01/01/2018 at Unknown time  . dicyclomine (BENTYL) 20 MG tablet Take one every 6-8 hours for abd cramps (Patient taking differently: Take 20 mg by mouth every 6 (six) hours as needed for spasms. Take one every 6-8 hours for abd cramps) 20 tablet 0    Assessment: 67yo male.  Pharmacy asked to replace PO4.  Labs  noted above.    Goal of Therapy:  Replace to WNL  Plan:  K-Phos IV today x 1 F/U labs in am  Valrie Hart A 01/07/2018,10:52 AM

## 2018-01-08 LAB — CBC
HEMATOCRIT: 31.2 % — AB (ref 39.0–52.0)
HEMOGLOBIN: 10.4 g/dL — AB (ref 13.0–17.0)
MCH: 30.3 pg (ref 26.0–34.0)
MCHC: 33.3 g/dL (ref 30.0–36.0)
MCV: 91 fL (ref 78.0–100.0)
Platelets: 271 10*3/uL (ref 150–400)
RBC: 3.43 MIL/uL — AB (ref 4.22–5.81)
RDW: 13.5 % (ref 11.5–15.5)
WBC: 11.9 10*3/uL — AB (ref 4.0–10.5)

## 2018-01-08 LAB — PHOSPHORUS: Phosphorus: 1.8 mg/dL — ABNORMAL LOW (ref 2.5–4.6)

## 2018-01-08 LAB — MAGNESIUM: Magnesium: 1.9 mg/dL (ref 1.7–2.4)

## 2018-01-08 LAB — GLUCOSE, CAPILLARY
GLUCOSE-CAPILLARY: 117 mg/dL — AB (ref 65–99)
GLUCOSE-CAPILLARY: 128 mg/dL — AB (ref 65–99)
GLUCOSE-CAPILLARY: 130 mg/dL — AB (ref 65–99)

## 2018-01-08 LAB — BASIC METABOLIC PANEL
ANION GAP: 9 (ref 5–15)
BUN: 5 mg/dL — AB (ref 6–20)
CHLORIDE: 102 mmol/L (ref 101–111)
CO2: 27 mmol/L (ref 22–32)
Calcium: 8.2 mg/dL — ABNORMAL LOW (ref 8.9–10.3)
Creatinine, Ser: 0.78 mg/dL (ref 0.61–1.24)
GFR calc Af Amer: >60 mL/min (ref >60)
Glucose, Bld: 118 mg/dL — ABNORMAL HIGH (ref 65–99)
POTASSIUM: 3.2 mmol/L — AB (ref 3.5–5.1)
Sodium: 138 mmol/L (ref 135–145)

## 2018-01-08 MED ORDER — POTASSIUM CHLORIDE 10 MEQ/100ML IV SOLN
10.0000 meq | INTRAVENOUS | Status: AC
  Administered 2018-01-08 (×3): 10 meq via INTRAVENOUS
  Filled 2018-01-08 (×3): qty 100

## 2018-01-08 MED ORDER — POTASSIUM PHOSPHATES 15 MMOLE/5ML IV SOLN
30.0000 mmol | Freq: Once | INTRAVENOUS | Status: AC
  Administered 2018-01-08: 30 mmol via INTRAVENOUS
  Filled 2018-01-08: qty 10

## 2018-01-08 NOTE — Progress Notes (Signed)
3 Days Post-Op  Subjective: Patient comfortable.  Denies incisional pain.  Had large bowel movement this morning.  Objective: Vital signs in last 24 hours: Temp:  [99.4 F (37.4 C)-100 F (37.8 C)] 99.5 F (37.5 C) (04/21 0759) Pulse Rate:  [63-122] 63 (04/21 0800) Resp:  [17-25] 20 (04/21 0800) BP: (131-167)/(84-127) 132/85 (04/21 0800) SpO2:  [92 %-96 %] 95 % (04/21 0800) Weight:  [187 lb 9.8 oz (85.1 kg)] 187 lb 9.8 oz (85.1 kg) (04/21 0500) Last BM Date: 01/07/18  Intake/Output from previous day: 04/20 0701 - 04/21 0700 In: 985.4 [I.V.:478.8; IV Piggyback:506.7] Out: 4100 [Urine:3800; Emesis/NG output:300] Intake/Output this shift: No intake/output data recorded.  General appearance: alert, cooperative and no distress Resp: clear to auscultation bilaterally Cardio: regular rate and rhythm, S1, S2 normal, no murmur, click, rub or gallop GI: Soft, bowel sounds active.  Incision healing well.  Lab Results:  Recent Labs    01/07/18 0428 01/08/18 0447  WBC 10.2 11.9*  HGB 10.2* 10.4*  HCT 30.8* 31.2*  PLT 250 271   BMET Recent Labs    01/07/18 0428 01/08/18 0447  NA 141 138  K 3.2* 3.2*  CL 107 102  CO2 22 27  GLUCOSE 95 118*  BUN 9 5*  CREATININE 0.84 0.78  CALCIUM 8.3* 8.2*   PT/INR No results for input(s): LABPROT, INR in the last 72 hours.  Studies/Results: No results found.  Anti-infectives: Anti-infectives (From admission, onward)   Start     Dose/Rate Route Frequency Ordered Stop   01/05/18 2200  cefoTEtan (CEFOTAN) 2 g in sodium chloride 0.9 % 100 mL IVPB     2 g 200 mL/hr over 30 Minutes Intravenous Every 12 hours 01/05/18 1527 01/06/18 1153   01/05/18 0900  cefoTEtan (CEFOTAN) 2 g in sodium chloride 0.9 % 100 mL IVPB     2 g 200 mL/hr over 30 Minutes Intravenous On call to O.R. 01/04/18 1449 01/05/18 2037   01/04/18 1145  cefoTEtan (CEFOTAN) 2 g in sodium chloride 0.9 % 100 mL IVPB  Status:  Discontinued     2 g 200 mL/hr over 30 Minutes  Intravenous On call to O.R. 01/04/18 1132 01/04/18 1448      Assessment/Plan: s/p Procedure(s): EXPLORATORY LAPAROTOMY EXTENSIVE LYSIS OF ADHESIONS SMALL BOWEL RESECTION Impression: Postoperative day 3.  Patient's bowel function starting to return.  Hypophosphatemia noted.  Mild hypokalemia noted.  Has had significant urine output.  Will leave Foley in due to history of phimosis and significant urine output.  Will remove gastric tube and advance to clear liquid diet.  We will transferred to regular floor on cardiac telemetry.  LOS: 7 days    Thomas [name] 01/08/2018

## 2018-01-08 NOTE — Progress Notes (Signed)
MEDICATION RELATED CONSULT NOTE - follow up  Pharmacy Consult for Phosphorous replacement Indication: hypophosphatemia  No Known Allergies  Patient Measurements: Height: 5\' 8"  (172.7 cm) Weight: 187 lb 9.8 oz (85.1 kg) IBW/kg (Calculated) : 68.4  Vital Signs: Temp: 99.5 F (37.5 C) (04/21 0759) Temp Source: Oral (04/21 0759) BP: 132/85 (04/21 0800) Pulse Rate: 63 (04/21 0800) Intake/Output from previous day: 04/20 0701 - 04/21 0700 In: 985.4 [I.V.:478.8; IV Piggyback:506.7] Out: 4100 [Urine:3800; Emesis/NG output:300] Intake/Output from this shift: No intake/output data recorded.  Labs: Recent Labs    01/06/18 0449 01/07/18 0428 01/08/18 0447  WBC 11.4* 10.2 11.9*  HGB 10.9* 10.2* 10.4*  HCT 33.4* 30.8* 31.2*  PLT 276 250 271  CREATININE 1.03 0.84 0.78  MG 1.3* 1.6* 1.9  PHOS 2.9 1.9* 1.8*   Estimated Creatinine Clearance: 92.6 mL/min (by C-G formula based on SCr of 0.78 mg/dL).  Medical History: Past Medical History:  Diagnosis Date  . AAA (abdominal aortic aneurysm) (HCC)    Repaired in the 90s  . Altered mental status   . Arthritis   . Confusion   . Hypertension   . Memory loss   . Seizures (HCC)    years ago no med now  . Spinal stenosis    Medications:  Medications Prior to Admission  Medication Sig Dispense Refill Last Dose  . amLODipine (NORVASC) 10 MG tablet Take 10 mg by mouth daily.   01/01/2018 at Unknown time  . aspirin 81 MG tablet Take 81 mg by mouth daily.   01/01/2018 at Unknown time  . bisoprolol (ZEBETA) 5 MG tablet Take 1 tablet (5 mg total) daily by mouth.   01/01/2018 at 900a  . donepezil (ARICEPT) 5 MG tablet Take 1 tablet (5 mg total) by mouth at bedtime. 30 tablet 6 12/31/2017 at Unknown time  . loperamide (IMODIUM) 2 MG capsule Take 1 capsule (2 mg total) by mouth 4 (four) times daily as needed for diarrhea or loose stools. 12 capsule 0 Past Month at Unknown time  . meclizine (ANTIVERT) 25 MG tablet Take 25 mg by mouth 3 (three)  times daily as needed for dizziness or nausea. For 10 days  2 unknown  . meloxicam (MOBIC) 7.5 MG tablet Take 7.5 mg 2 (two) times daily by mouth.   01/01/2018 at Unknown time  . mirtazapine (REMERON) 7.5 MG tablet Take 7.5 mg at bedtime by mouth.  11 12/31/2017 at Unknown time  . omeprazole (PRILOSEC) 20 MG capsule Take 20 mg by mouth 2 (two) times daily.    01/01/2018 at Unknown time  . potassium chloride SA (K-DUR,KLOR-CON) 20 MEQ tablet Take 1 tablet (20 mEq total) daily by mouth. (Patient taking differently: Take 20 mEq by mouth 3 (three) times daily. ) 20 tablet 0 01/01/2018 at Unknown time  . sucralfate (CARAFATE) 1 GM/10ML suspension Take 10 mLs (1 g total) by mouth 4 (four) times daily -  with meals and at bedtime. 420 mL 0 01/01/2018 at Unknown time  . topiramate (TOPAMAX) 25 MG tablet Take 1 tablet (25 mg total) by mouth 2 (two) times daily. 180 tablet 3 01/01/2018 at Unknown time  . dicyclomine (BENTYL) 20 MG tablet Take one every 6-8 hours for abd cramps (Patient taking differently: Take 20 mg by mouth every 6 (six) hours as needed for spasms. Take one every 6-8 hours for abd cramps) 20 tablet 0    Assessment: 67yo male.  Pharmacy asked to replace PO4.  Labs noted above.  PO4 did not  improve with K-Phos yesterday.   Goal of Therapy:  Replace to WNL  Plan:  K-Phos IV today x 1 F/U labs in am  Thomas [name] A 01/08/2018,9:08 AM

## 2018-01-09 ENCOUNTER — Encounter (HOSPITAL_COMMUNITY): Payer: Self-pay | Admitting: Internal Medicine

## 2018-01-09 ENCOUNTER — Encounter: Payer: Self-pay | Admitting: Gastroenterology

## 2018-01-09 ENCOUNTER — Telehealth: Payer: Self-pay | Admitting: Gastroenterology

## 2018-01-09 LAB — GLUCOSE, CAPILLARY
GLUCOSE-CAPILLARY: 103 mg/dL — AB (ref 65–99)
GLUCOSE-CAPILLARY: 105 mg/dL — AB (ref 65–99)
Glucose-Capillary: 96 mg/dL (ref 65–99)
Glucose-Capillary: 136 mg/dL — ABNORMAL HIGH (ref 65–99)

## 2018-01-09 LAB — BASIC METABOLIC PANEL
Anion gap: 9 (ref 5–15)
CALCIUM: 8.4 mg/dL — AB (ref 8.9–10.3)
CO2: 25 mmol/L (ref 22–32)
CREATININE: 0.86 mg/dL (ref 0.61–1.24)
Chloride: 105 mmol/L (ref 101–111)
GFR calc non Af Amer: >60 mL/min (ref >60)
Glucose, Bld: 110 mg/dL — ABNORMAL HIGH (ref 65–99)
Potassium: 3.2 mmol/L — ABNORMAL LOW (ref 3.5–5.1)
Sodium: 139 mmol/L (ref 135–145)

## 2018-01-09 LAB — CBC
HEMATOCRIT: 28.8 % — AB (ref 39.0–52.0)
Hemoglobin: 9.7 g/dL — ABNORMAL LOW (ref 13.0–17.0)
MCH: 30.4 pg (ref 26.0–34.0)
MCHC: 33.7 g/dL (ref 30.0–36.0)
MCV: 90.3 fL (ref 78.0–100.0)
Platelets: 274 10*3/uL (ref 150–400)
RBC: 3.19 MIL/uL — ABNORMAL LOW (ref 4.22–5.81)
RDW: 13.4 % (ref 11.5–15.5)
WBC: 8.2 10*3/uL (ref 4.0–10.5)

## 2018-01-09 LAB — PHOSPHORUS: Phosphorus: 2.1 mg/dL — ABNORMAL LOW (ref 2.5–4.6)

## 2018-01-09 LAB — MAGNESIUM: MAGNESIUM: 1.7 mg/dL (ref 1.7–2.4)

## 2018-01-09 MED ORDER — TOPIRAMATE 25 MG PO TABS
25.0000 mg | ORAL_TABLET | Freq: Two times a day (BID) | ORAL | Status: DC
  Administered 2018-01-09 – 2018-01-10 (×3): 25 mg via ORAL
  Filled 2018-01-09 (×3): qty 1

## 2018-01-09 MED ORDER — AMLODIPINE BESYLATE 5 MG PO TABS
10.0000 mg | ORAL_TABLET | Freq: Every day | ORAL | Status: DC
  Administered 2018-01-09 – 2018-01-10 (×2): 10 mg via ORAL
  Filled 2018-01-09 (×2): qty 2

## 2018-01-09 MED ORDER — MIRTAZAPINE 15 MG PO TABS
7.5000 mg | ORAL_TABLET | Freq: Every day | ORAL | Status: DC
  Administered 2018-01-09: 7.5 mg via ORAL
  Filled 2018-01-09: qty 1

## 2018-01-09 MED ORDER — BISOPROLOL FUMARATE 5 MG PO TABS
5.0000 mg | ORAL_TABLET | Freq: Every day | ORAL | Status: DC
  Administered 2018-01-09 – 2018-01-10 (×2): 5 mg via ORAL
  Filled 2018-01-09 (×2): qty 1

## 2018-01-09 MED ORDER — DOCUSATE SODIUM 100 MG PO CAPS
100.0000 mg | ORAL_CAPSULE | Freq: Two times a day (BID) | ORAL | Status: DC
  Administered 2018-01-09 – 2018-01-10 (×3): 100 mg via ORAL
  Filled 2018-01-09 (×3): qty 1

## 2018-01-09 MED ORDER — MAGNESIUM SULFATE 2 GM/50ML IV SOLN
2.0000 g | Freq: Once | INTRAVENOUS | Status: AC
  Administered 2018-01-09: 2 g via INTRAVENOUS
  Filled 2018-01-09: qty 50

## 2018-01-09 MED ORDER — POTASSIUM CHLORIDE 20 MEQ PO PACK
40.0000 meq | PACK | Freq: Two times a day (BID) | ORAL | Status: AC
  Administered 2018-01-09 (×2): 40 meq via ORAL
  Filled 2018-01-09 (×2): qty 2

## 2018-01-09 MED ORDER — DONEPEZIL HCL 5 MG PO TABS
5.0000 mg | ORAL_TABLET | Freq: Every day | ORAL | Status: DC
  Administered 2018-01-09: 5 mg via ORAL
  Filled 2018-01-09: qty 1

## 2018-01-09 MED ORDER — BISACODYL 10 MG RE SUPP: 10.0000 mg | Freq: Every day | RECTAL | Status: DC | PRN

## 2018-01-09 MED ORDER — K PHOS MONO-SOD PHOS DI & MONO 155-852-130 MG PO TABS
500.0000 mg | ORAL_TABLET | Freq: Two times a day (BID) | ORAL | Status: AC
  Administered 2018-01-09 (×2): 500 mg via ORAL
  Filled 2018-01-09 (×2): qty 2

## 2018-01-09 NOTE — Telephone Encounter (Signed)
Thomas [name]: patient has appt upcoming in May 2019 with Verlon AuLeslie, but we need to push this out a bit farther. Needs hospital follow-up in 8 weeks. Will arrange EGD at that time.

## 2018-01-09 NOTE — Progress Notes (Signed)
Pt ambulated in the halls around the nursing station without difficulty. Returned to room and is sitting in a chair.

## 2018-01-09 NOTE — Progress Notes (Signed)
Chart reviewed. PUD. Negative H.pylori. PPI BID as recommended, with EGD in 3 months. Will arrange outpatient follow-up with [facility] and arrange EGD as outpatient. Avoid NSAIDs.   Gelene MinkAnna W. Keilen Kahl, PhD, ANP-BC [facility]

## 2018-01-09 NOTE — Telephone Encounter (Signed)
Rescheduled patient to later date and mailed a letter

## 2018-01-09 NOTE — Progress Notes (Signed)
[facility] Progress Note  4 Days Post-Op  Subjective: No major issues. Having Bms and flatus, no nausea. Tolerated clears. Walked yesterday per his report. Foley out this AM and voided already.   Objective: Vital signs in last 24 hours: Temp:  [98.6 F (37 C)-99 F (37.2 C)] 98.6 F (37 C) (04/22 0503) Pulse Rate:  [70-91] 83 (04/22 0907) Resp:  [20-25] 20 (04/22 0503) BP: (148-172)/(91-111) 156/103 (04/22 0907) SpO2:  [96 %-100 %] 100 % (04/22 0503) Last BM Date: 01/07/18  Intake/Output from previous day: 04/21 0701 - 04/22 0700 In: 2081.3 [P.O.:240; I.V.:1331.3; IV Piggyback:510] Out: 2200 [Urine:2200] Intake/Output this shift: No intake/output data recorded.  General appearance: alert, cooperative and no distress Resp: normal work breathing GI: soft, mildly distended, appropriately tender, honeycomb dressing c./d/i with no erythema seen Extremities: extremities normal, atraumatic, no cyanosis or edema  Lab Results:  Recent Labs    01/08/18 0447 01/09/18 0419  WBC 11.9* 8.2  HGB 10.4* 9.7*  HCT 31.2* 28.8*  PLT 271 274   BMET Recent Labs    01/08/18 0447 01/09/18 0419  NA 138 139  K 3.2* 3.2*  CL 102 105  CO2 27 25  GLUCOSE 118* 110*  BUN 5* <5*  CREATININE 0.78 0.86  CALCIUM 8.2* 8.4*     Assessment/Plan: [name] is a 67 yo POD 4 s/p Ex lap, SBR for partial closed loop obstruction. Doing fair. Having some bowel function.  -PRN for pain -IS, OOB -Needs to ambulate -Hypertensive, home meds ordered back, Iv metoprolol d/c  -Full liquid diet, colace added  -Lytes replaced  -SCDs, lovenox  -Other home meds also ordered including topamax/ remeron    LOS: 8 days    Thomas [name] 01/09/2018

## 2018-01-09 NOTE — Progress Notes (Signed)
MEDICATION RELATED CONSULT NOTE - follow up  Pharmacy Consult for Phosphorous replacement Indication: hypophosphatemia  No Known Allergies  Patient Measurements: Height: 5\' 8"  (172.7 cm) Weight: 187 lb 9.8 oz (85.1 kg) IBW/kg (Calculated) : 68.4  Vital Signs: Temp: 98.6 F (37 C) (04/22 0503) Temp Source: Oral (04/22 0503) BP: 156/103 (04/22 0907) Pulse Rate: 83 (04/22 0907) Intake/Output from previous day: 04/21 0701 - 04/22 0700 In: 2081.3 [P.O.:240; I.V.:1331.3; IV Piggyback:510] Out: 2200 [Urine:2200] Intake/Output from this shift: No intake/output data recorded.  Labs: Recent Labs    01/07/18 0428 01/08/18 0447 01/09/18 0419  WBC 10.2 11.9* 8.2  HGB 10.2* 10.4* 9.7*  HCT 30.8* 31.2* 28.8*  PLT 250 271 274  CREATININE 0.84 0.78 0.86  MG 1.6* 1.9 1.7  PHOS 1.9* 1.8* 2.1*   Estimated Creatinine Clearance: 86.1 mL/min (by C-G formula based on SCr of 0.86 mg/dL).  Medical History: Past Medical History:  Diagnosis Date  . AAA (abdominal aortic aneurysm) (HCC)    Repaired in the 90s  . Altered mental status   . Arthritis   . Confusion   . Hypertension   . Memory loss   . Seizures (HCC)    years ago no med now  . Spinal stenosis    Medications:  Medications Prior to Admission  Medication Sig Dispense Refill Last Dose  . amLODipine (NORVASC) 10 MG tablet Take 10 mg by mouth daily.   01/01/2018 at Unknown time  . aspirin 81 MG tablet Take 81 mg by mouth daily.   01/01/2018 at Unknown time  . bisoprolol (ZEBETA) 5 MG tablet Take 1 tablet (5 mg total) daily by mouth.   01/01/2018 at 900a  . donepezil (ARICEPT) 5 MG tablet Take 1 tablet (5 mg total) by mouth at bedtime. 30 tablet 6 12/31/2017 at Unknown time  . loperamide (IMODIUM) 2 MG capsule Take 1 capsule (2 mg total) by mouth 4 (four) times daily as needed for diarrhea or loose stools. 12 capsule 0 Past Month at Unknown time  . meclizine (ANTIVERT) 25 MG tablet Take 25 mg by mouth 3 (three) times daily as  needed for dizziness or nausea. For 10 days  2 unknown  . meloxicam (MOBIC) 7.5 MG tablet Take 7.5 mg 2 (two) times daily by mouth.   01/01/2018 at Unknown time  . mirtazapine (REMERON) 7.5 MG tablet Take 7.5 mg at bedtime by mouth.  11 12/31/2017 at Unknown time  . omeprazole (PRILOSEC) 20 MG capsule Take 20 mg by mouth 2 (two) times daily.    01/01/2018 at Unknown time  . potassium chloride SA (K-DUR,KLOR-CON) 20 MEQ tablet Take 1 tablet (20 mEq total) daily by mouth. (Patient taking differently: Take 20 mEq by mouth 3 (three) times daily. ) 20 tablet 0 01/01/2018 at Unknown time  . sucralfate (CARAFATE) 1 GM/10ML suspension Take 10 mLs (1 g total) by mouth 4 (four) times daily -  with meals and at bedtime. 420 mL 0 01/01/2018 at Unknown time  . topiramate (TOPAMAX) 25 MG tablet Take 1 tablet (25 mg total) by mouth 2 (two) times daily. 180 tablet 3 01/01/2018 at Unknown time  . dicyclomine (BENTYL) 20 MG tablet Take one every 6-8 hours for abd cramps (Patient taking differently: Take 20 mg by mouth every 6 (six) hours as needed for spasms. Take one every 6-8 hours for abd cramps) 20 tablet 0    Assessment: 67yo male.  Pharmacy asked to replace PO4.  Labs noted above.  PO4 still low despite  IV K-Phos.     Goal of Therapy:  Replace to WNL  Plan:  K-Phos Neutral 500mg  po BID ordered F/U labs in am  Valrie HartHall, Demonie Kassa A 01/09/2018,9:46 AM

## 2018-01-09 NOTE — Care Management Note (Signed)
Case Management Note  Patient Details  Name: Thomas [name] M Enis MRN: [id number] Date of Birth: 1951/08/21  Expected Discharge Date:     01/12/2018             Expected Discharge Plan:  Home w Home Health Services  In-House Referral:  NA  Discharge planning Services  CM Consult  Post Acute Care Choice:  Home Health [facility] to:  [facility]  DME Arranged:    DME Agency:     HH Arranged:  RN HH Agency:  [facility]  Status of Service:  Completed, signed off  If discussed at [employer] Length of [employer] Meetings, dates discussed:    Additional Comments: [facility]. Have no preference of HH provider, agreeable to [facility]. Anticipate DC home in next 1-2 days. [facility] rep aware of referral and will pull info from chart.   Malcolm Metrohildress, Alter Moss Demske, RN 01/09/2018, 2:53 PM

## 2018-01-10 ENCOUNTER — Encounter (HOSPITAL_COMMUNITY): Payer: Self-pay | Admitting: General Surgery

## 2018-01-10 LAB — GLUCOSE, CAPILLARY
GLUCOSE-CAPILLARY: 104 mg/dL — AB (ref 65–99)
GLUCOSE-CAPILLARY: 110 mg/dL — AB (ref 65–99)

## 2018-01-10 LAB — PHOSPHORUS: PHOSPHORUS: 3 mg/dL (ref 2.5–4.6)

## 2018-01-10 LAB — BASIC METABOLIC PANEL
Anion gap: 8 (ref 5–15)
BUN: <5 mg/dL — ABNORMAL LOW (ref 6–20)
CO2: 23 mmol/L (ref 22–32)
Calcium: 8.6 mg/dL — ABNORMAL LOW (ref 8.9–10.3)
Chloride: 111 mmol/L (ref 101–111)
Creatinine, Ser: 0.89 mg/dL (ref 0.61–1.24)
GFR calc Af Amer: >60 mL/min (ref >60)
Glucose, Bld: 104 mg/dL — ABNORMAL HIGH (ref 65–99)
POTASSIUM: 3.7 mmol/L (ref 3.5–5.1)
SODIUM: 142 mmol/L (ref 135–145)

## 2018-01-10 LAB — MAGNESIUM: Magnesium: 1.7 mg/dL (ref 1.7–2.4)

## 2018-01-10 MED ORDER — DOCUSATE SODIUM 100 MG PO CAPS: 100.0000 mg | ORAL_CAPSULE | Freq: Two times a day (BID) | ORAL | 0 refills | Status: DC

## 2018-01-10 MED ORDER — ONDANSETRON HCL 4 MG PO TABS: 4.0000 mg | ORAL_TABLET | Freq: Four times a day (QID) | ORAL | 0 refills | Status: DC | PRN

## 2018-01-10 MED ORDER — OXYCODONE HCL 5 MG PO TABS: 5.0000 mg | ORAL_TABLET | ORAL | 0 refills | Status: DC | PRN

## 2018-01-10 NOTE — Discharge Summary (Signed)
Physician Discharge Summary  Patient ID: Thomas [name] MRN: [id number] DOB/AGE: 05-21-1951 67 y.o.  Admit date: 01/01/2018 Discharge date: 01/10/2018  Admission Diagnoses:  Discharge Diagnoses:  Principal Problem:   Small bowel obstruction, partial (HCC) Active Problems:   Dementia   Essential hypertension   Cervical spinal stenosis   Hyperglycemia   CKD (chronic kidney disease) stage 2, GFR 60-89 ml/min   Phimosis   Discharged Condition: Good  Hospital Course: Thomas [name] is a very pleasant gentleman who presented with a small bowel obstruction and failed NG/ NPO management. He was taken to the OR for exploration and underwent a small bowel resection due to a strictured area with multiple serosal tears in the dilated segment leading up to the stricture. He had essentially a partial closed loop obstruction due to having decompressed bowel proximal and distal to the dilated bowel, and has likely had some degree of intermittent obstruction for some time now.   He was also found to have a thickened stomach on the CT scan, and underwent EGD and was found to have an ulcer and biopsies were negative for malignancy or H pylori. He will see GI as an outpatient for repeat EGD. He is instructed to avoid NSAIDs due to the ulcer.   After his surgery, he was monitored in the ICU, and was ultimately transferred to the floor. He was ambulating without assistance. He was eating, and having BMs.  Overall he was feeling well prior to discharge.  Of note, in the OR on placement of the foley, it was noted that he had some degree of phimosis, and his foreskin was difficult to retract. This was ultimately retracted, and a foley was placed.  He did well with voiding once the foley was removed, but will need urology follow up as an outpatient to assess for any needs he may require in the future.    He also had some issues with BP control during the hospitalization, but once the home meds were added back, his BP was  in a more appropriate range 120-130/ 80-90.   I have spoken with this caregiver [name], nephew, a few times, and he lives with Thomas [name], in addition Home Health has been arranged to help in the immediate post discharge period.   Consults: Hospitalist service- transferred after surgery; GI consult- Dr. Jena Gauss for EGD  Significant Diagnostic Studies: CT - SBO and thickened stomach  Treatments:  EGD with normal biospies, gastritis/ ulcer; Surgery 01/05/2018- Ex lap, SBR, lysis of adhesions   Discharge Exam: Blood pressure 128/80, pulse (!) 56, temperature 98.7 F (37.1 C), temperature source Oral, resp. rate 18, height 5\' 8"  (1.727 m), weight 187 lb 9.8 oz (85.1 kg), SpO2 98 %. General appearance: alert, cooperative and no distress Resp: normal work breathing GI: soft, nondistended, appropriately tender, staples, c/d/i with no erythema or drainage Extremities: extremities normal, atraumatic, no cyanosis or edema  Disposition: Discharge disposition: 01-Home or Self Care       Discharge Instructions    Call MD for:  difficulty breathing, headache or visual disturbances   Complete by:  As directed    Call MD for:  extreme fatigue   Complete by:  As directed    Call MD for:  persistant dizziness or light-headedness   Complete by:  As directed    Call MD for:  persistant nausea and vomiting   Complete by:  As directed    Call MD for:  redness, tenderness, or signs of infection (pain, swelling, redness,  odor or green/yellow discharge around incision site)   Complete by:  As directed    Call MD for:  severe uncontrolled pain   Complete by:  As directed    Call MD for:  temperature >100.4   Complete by:  As directed    Diet - low sodium heart healthy   Complete by:  As directed    Increase activity slowly   Complete by:  As directed      Allergies as of 01/10/2018   No Known Allergies     Medication List    STOP taking these medications   meloxicam 7.5 MG tablet Commonly  known as:  MOBIC     TAKE these medications   amLODipine 10 MG tablet Commonly known as:  NORVASC Take 10 mg by mouth daily.   aspirin 81 MG tablet Take 81 mg by mouth daily.   bisoprolol 5 MG tablet Commonly known as:  ZEBETA Take 1 tablet (5 mg total) daily by mouth.   dicyclomine 20 MG tablet Commonly known as:  BENTYL Take one every 6-8 hours for abd cramps What changed:    how much to take  how to take this  when to take this  reasons to take this  additional instructions   docusate sodium 100 MG capsule Commonly known as:  COLACE Take 1 capsule (100 mg total) by mouth 2 (two) times daily.   donepezil 5 MG tablet Commonly known as:  ARICEPT Take 1 tablet (5 mg total) by mouth at bedtime.   loperamide 2 MG capsule Commonly known as:  IMODIUM Take 1 capsule (2 mg total) by mouth 4 (four) times daily as needed for diarrhea or loose stools.   meclizine 25 MG tablet Commonly known as:  ANTIVERT Take 25 mg by mouth 3 (three) times daily as needed for dizziness or nausea. For 10 days   mirtazapine 7.5 MG tablet Commonly known as:  REMERON Take 7.5 mg at bedtime by mouth.   omeprazole 20 MG capsule Commonly known as:  PRILOSEC Take 20 mg by mouth 2 (two) times daily.   ondansetron 4 MG tablet Commonly known as:  ZOFRAN Take 1 tablet (4 mg total) by mouth every 6 (six) hours as needed for nausea.   oxyCODONE 5 MG immediate release tablet Commonly known as:  Oxy IR/ROXICODONE Take 1 tablet (5 mg total) by mouth every 4 (four) hours as needed for severe pain or breakthrough pain.   potassium chloride SA 20 MEQ tablet Commonly known as:  K-DUR,KLOR-CON Take 1 tablet (20 mEq total) daily by mouth. What changed:  when to take this   sucralfate 1 GM/10ML suspension Commonly known as:  CARAFATE Take 10 mLs (1 g total) by mouth 4 (four) times daily -  with meals and at bedtime.   topiramate 25 MG tablet Commonly known as:  TOPAMAX Take 1 tablet (25 mg  total) by mouth 2 (two) times daily.      Follow-up Information    Lucretia Roers, MD Follow up on 01/24/2018.   Specialty:  General Surgery Why:  Appointment at  Contact information: [address] [place] [facility] [address] [phone]        [facility] Follow up in 8 week(s).   Specialty:  Gastroenterology Why:  They should call you with an appointment. But if not, call them in May 2019.  Contact information: [address] [place] [address] [phone]       Shawnie Dapper, PA-C Follow  up in 2 week(s).   Specialties:  Physician Assistant, Internal Medicine Contact information: [address] [place] [address] [phone]           Signed: Lucretia RoersLindsay C Synethia Endicott 01/10/2018, 8:57 AM

## 2018-01-10 NOTE — Care Management Important Message (Signed)
Important Message  Patient Details  Name: Thomas [name] MRN: [id number] Date of Birth: [birth year]   Medicare Important Message Given:  Yes    Malcolm MetroChildress, Sahvannah Rieser Demske, RN 01/10/2018, 9:13 AM

## 2018-01-10 NOTE — Progress Notes (Signed)
Discharge instructions reviewed with patient's nephew at bedside this afternoon per patient's request. Verbalized understanding. Aware of prescriptions at [facility] to pick up. Pt left floor in stable condition via w/c accompanied by nurse tech. Earnstine RegalAshley Tuwanda Vokes, RN

## 2018-01-10 NOTE — Progress Notes (Signed)
Discharge instructions reviewed with patient. Copy of AVS provided. Reviewed post-op education, incision care, diet instructions, follow-up appointment as scheduled, when to seek medical attention and home medications. Prescriptions sent to [facility] by MD. Pt verbalized understanding. States he will call to schedule follow-up with PCP and understands GI office will call him with appointment and to call them in May if he has not received call. Home health arranged by CM, patient aware they will call him to arrange time to see him at home. Denies pain or discomfort at this time, sitting up in chair. Pt in stable condition awaiting his nephew's arrival for discharge home. Earnstine RegalAshley Thara Searing, RN

## 2018-01-10 NOTE — Care Management Note (Addendum)
Case Management Note  Patient Details  Name: Sherlean FootLouis M Raineri MRN: [id number] Date of Birth: Aug 18, 1951  Expected Discharge Date:  01/10/18               Expected Discharge Plan:  Home w Home Health Services  In-House Referral:  NA  Discharge planning Services  CM Consult  Post Acute Care Choice:  Home Health Choice offered to:  [facility]  HH Arranged:  RN HH Agency[facility]  [facility]  Status of Service:  Completed, signed off  If discussed at [employer] Length of Stay Meetings, dates discussed:  01/10/2018  Additional Comments: Pt discharging home today. [facility] rep, aware of DC today. Pt aware HH has 48 hrs to make first visit. Pt had has 2 admits and 3 ED visits in 6 months but does not have chronic illness to [facility] despite being on [facility]. CM will not make referral.   Malcolm Metrohildress, Estephan Gallardo Demske, RN 01/10/2018, 9:06 AM

## 2018-01-10 NOTE — Discharge Instructions (Signed)
Discharge Instructions: Shower per your regular routine. Take tylenol as needed for pain control. The Gastroenterologist (GI doctor) does not want you to take any NSAIDS (ibuprofen, mobic, aleve, BC powder) due to the irritation/ ulcer in your stomach.  Avoid these until you see them in 8 weeks.   Take Roxicodone for breakthrough pain. Take colace for constipation related to narcotic pain medication. Do not pick at the staples on your incision. You can place a dry dressing daily to the area if desired but this is not necessary.  Open Small Bowel Resection, Care After This sheet gives you information about how to care for yourself after your procedure. Your health care provider may also give you more specific instructions. If you have problems or questions, contact your health care provider. What can I expect after the procedure? After the procedure, it is common to have:  Pain in your abdomen, especially along your incision. You will be given pain medicines to control this.  Tiredness. This is a normal part of the recovery process. Your energy level will return to normal over the next several weeks.  Constipation. You may be given a stool softener to help prevent this.  Follow these instructions at home: Medicines  Take over-the-counter and prescription medicines only as told by your health care provider.  Do not drive or use heavy machinery while taking prescription pain medicine.  If you were prescribed an antibiotic medicine, use it as told by your health care provider. Do not stop using the antibiotic even if you start to feel better. Activity  Return to your normal activities as told by your health care provider. Ask your health care provider what activities are safe for you.  Do not lift anything that is heavier than 10 lb (4.5 kg) until your health care provider says that it is safe.  Take frequent rest breaks during the day as needed.  Try to take short walks every day  for the amount of time that your health care provider suggests.  Avoid activities that require a lot of effort (are strenuous) for as long as told by your health care provider. Incision care  Follow instructions from your health care provider about how to take care of your incision. Make sure you: ? Wash your hands with soap and water before you change your bandage (dressing). If soap and water are not available, use hand sanitizer. ? Change your dressing as told by your health care provider. ? Leave stitches (sutures), skin glue, or adhesive strips in place. These skin closures may need to stay in place for 2 weeks or longer. If adhesive strip edges start to loosen and curl up, you may trim the loose edges. Do not remove adhesive strips completely unless your health care provider tells you to do that.  Check your incision area every day for signs of infection. Check for: ? More redness, swelling, or pain. ? More fluid or blood. ? Warmth. ? Pus or a bad smell.  Do not take baths, swim, or use a hot tub until your health care provider approves. Ask your health care provider if you can take showers. You may only be allowed to take sponge baths for bathing. General instructions  Continue to practice deep breathing and coughing. If it hurts to cough, try holding a pillow against your abdomen as you cough.  To prevent or treat constipation while you are taking prescription pain medicine, your health care provider may recommend that you: ? Drink enough fluid to  keep your urine clear or pale yellow. ? Take over-the-counter or prescription medicines. ? Eat foods that are high in fiber, such as fresh fruits and vegetables, whole grains, and beans. ? Limit foods that are high in fat and processed sugars, such as fried and sweet foods.  Do not use any products that contain nicotine or tobacco as told by your health care provider. These include cigarettes and e-cigarettes. If you need help quitting,  ask your health care provider.  Keep all follow-up visits as told by your health care provider. This is important. Contact a health care provider if:  You have pain that is not relieved with medicine.  You do not feel like eating.  You feel nauseous or you vomit.  You have constipation that is not relieved with prescribed stool softeners.  You have more redness, swelling, or pain around your incision.  You have more fluid or blood coming from your incision.  Your incision feels warm to the touch.  You have pus or a bad smell coming from your incision.  You have a fever. Get help right away if:  Your pain gets worse, even after you take pain medicine.  Your legs or arms hurt or become red or swollen.  You have chest pain.  You have trouble breathing. This information is not intended to replace advice given to you by your health care provider. Make sure you discuss any questions you have with your health care provider. Document Released: 02/08/2011 Document Revised: 06/15/2016 Document Reviewed: 06/07/2016 Elsevier Interactive Patient Education  2018 [employer]   Peptic Ulcer A peptic ulcer is a painful sore in the lining of your esophagus, stomach, or the first part of your small intestine. You may have pain in the area between your chest and your belly button. The most common causes of an ulcer are:  An infection.  Using certain pain medicines too often or too much.  Follow these instructions at home:  Avoid alcohol.  Avoid caffeine.  Do not use any tobacco products. These include cigarettes, chewing tobacco, and e-cigarettes. If you need help quitting, ask your doctor.  Take over-the-counter and prescription medicines only as told by your doctor. Do not stop or change your medicines unless you talk with your doctor about it first.  Keep all follow-up visits as told by your doctor. This is important. Contact a doctor if:  You do not get better in 7 days  after you start treatment.  You keep having an upset stomach (indigestion) or heartburn. Get help right away if:  You have sudden, sharp pain in your belly (abdomen).  You have lasting belly pain.  You have bloody poop (stool) or black, tarry poop.  You throw up (vomit) blood. It may look like coffee grounds.  You feel light-headed or feel like you may pass out (faint).  You get weak.  You get sweaty or feel sticky and cold to the touch (clammy). This information is not intended to replace advice given to you by your health care provider. Make sure you discuss any questions you have with your health care provider. Document Released: 12/01/2009 Document Revised: 01/21/2016 Document Reviewed: 06/07/2015 Elsevier Interactive Patient Education  [employer]2018 Elsevier Inc.

## 2018-01-12 DIAGNOSIS — Z48815 Encounter for surgical aftercare following surgery on the digestive system: Secondary | ICD-10-CM | POA: Diagnosis not present

## 2018-01-12 DIAGNOSIS — F0390 Unspecified dementia without behavioral disturbance: Secondary | ICD-10-CM | POA: Diagnosis not present

## 2018-01-12 DIAGNOSIS — I129 Hypertensive chronic kidney disease with stage 1 through stage 4 chronic kidney disease, or unspecified chronic kidney disease: Secondary | ICD-10-CM | POA: Diagnosis not present

## 2018-01-12 DIAGNOSIS — N182 Chronic kidney disease, stage 2 (mild): Secondary | ICD-10-CM | POA: Diagnosis not present

## 2018-01-12 DIAGNOSIS — M4802 Spinal stenosis, cervical region: Secondary | ICD-10-CM | POA: Diagnosis not present

## 2018-01-12 DIAGNOSIS — M1991 Primary osteoarthritis, unspecified site: Secondary | ICD-10-CM | POA: Diagnosis not present

## 2018-01-17 DIAGNOSIS — M1991 Primary osteoarthritis, unspecified site: Secondary | ICD-10-CM | POA: Diagnosis not present

## 2018-01-17 DIAGNOSIS — I129 Hypertensive chronic kidney disease with stage 1 through stage 4 chronic kidney disease, or unspecified chronic kidney disease: Secondary | ICD-10-CM | POA: Diagnosis not present

## 2018-01-17 DIAGNOSIS — Z48815 Encounter for surgical aftercare following surgery on the digestive system: Secondary | ICD-10-CM | POA: Diagnosis not present

## 2018-01-17 DIAGNOSIS — N182 Chronic kidney disease, stage 2 (mild): Secondary | ICD-10-CM | POA: Diagnosis not present

## 2018-01-17 DIAGNOSIS — M4802 Spinal stenosis, cervical region: Secondary | ICD-10-CM | POA: Diagnosis not present

## 2018-01-17 DIAGNOSIS — F0390 Unspecified dementia without behavioral disturbance: Secondary | ICD-10-CM | POA: Diagnosis not present

## 2018-01-19 DIAGNOSIS — I129 Hypertensive chronic kidney disease with stage 1 through stage 4 chronic kidney disease, or unspecified chronic kidney disease: Secondary | ICD-10-CM | POA: Diagnosis not present

## 2018-01-19 DIAGNOSIS — M4802 Spinal stenosis, cervical region: Secondary | ICD-10-CM | POA: Diagnosis not present

## 2018-01-19 DIAGNOSIS — Z48815 Encounter for surgical aftercare following surgery on the digestive system: Secondary | ICD-10-CM | POA: Diagnosis not present

## 2018-01-19 DIAGNOSIS — N182 Chronic kidney disease, stage 2 (mild): Secondary | ICD-10-CM | POA: Diagnosis not present

## 2018-01-19 DIAGNOSIS — M1991 Primary osteoarthritis, unspecified site: Secondary | ICD-10-CM | POA: Diagnosis not present

## 2018-01-19 DIAGNOSIS — F0390 Unspecified dementia without behavioral disturbance: Secondary | ICD-10-CM | POA: Diagnosis not present

## 2018-01-23 DIAGNOSIS — M4802 Spinal stenosis, cervical region: Secondary | ICD-10-CM | POA: Diagnosis not present

## 2018-01-23 DIAGNOSIS — I129 Hypertensive chronic kidney disease with stage 1 through stage 4 chronic kidney disease, or unspecified chronic kidney disease: Secondary | ICD-10-CM | POA: Diagnosis not present

## 2018-01-23 DIAGNOSIS — Z48815 Encounter for surgical aftercare following surgery on the digestive system: Secondary | ICD-10-CM | POA: Diagnosis not present

## 2018-01-23 DIAGNOSIS — N182 Chronic kidney disease, stage 2 (mild): Secondary | ICD-10-CM | POA: Diagnosis not present

## 2018-01-23 DIAGNOSIS — F0390 Unspecified dementia without behavioral disturbance: Secondary | ICD-10-CM | POA: Diagnosis not present

## 2018-01-23 DIAGNOSIS — M1991 Primary osteoarthritis, unspecified site: Secondary | ICD-10-CM | POA: Diagnosis not present

## 2018-01-24 ENCOUNTER — Ambulatory Visit (INDEPENDENT_AMBULATORY_CARE_PROVIDER_SITE_OTHER): Payer: Self-pay | Admitting: General Surgery

## 2018-01-24 ENCOUNTER — Encounter: Payer: Self-pay | Admitting: General Surgery

## 2018-01-24 VITALS — BP 128/80 | HR 66 | Temp 98.6°F | Resp 16 | Ht 68.0 in | Wt 179.0 lb

## 2018-01-24 DIAGNOSIS — K56609 Unspecified intestinal obstruction, unspecified as to partial versus complete obstruction: Secondary | ICD-10-CM | POA: Diagnosis not present

## 2018-01-24 DIAGNOSIS — E663 Overweight: Secondary | ICD-10-CM | POA: Diagnosis not present

## 2018-01-24 DIAGNOSIS — E876 Hypokalemia: Secondary | ICD-10-CM | POA: Diagnosis not present

## 2018-01-24 DIAGNOSIS — K56600 Partial intestinal obstruction, unspecified as to cause: Secondary | ICD-10-CM

## 2018-01-24 DIAGNOSIS — Z6827 Body mass index (BMI) 27.0-27.9, adult: Secondary | ICD-10-CM | POA: Diagnosis not present

## 2018-01-24 NOTE — Patient Instructions (Addendum)
No heavy lifting > 10 lbs until after March 02, 2018.

## 2018-01-24 NOTE — Progress Notes (Signed)
[facility] Note   HPI:  67 y.o. Male presents to clinic for post-op follow-up evaluation after an Ex lap, SBR for partial SBO.  He is doing great. He is eating and pain is improved.. Patient reports he has no complaints.  Review of Systems:  No fevers or chills Incision healing BMs daily  All other review of systems: otherwise negative   Pathology: Diagnosis Small intestine, resection - BENIGN SMALL BOWEL WITH FOCAL DEFECT, SEROSITIS AND ADHESIONS. - NO DYSPLASIA OR MALIGNANCY.  Vital Signs:  BP 128/80 (BP Location: Left Arm, Patient Position: Sitting, Cuff Size: Normal)   Pulse 66   Temp 98.6 F (37 C) (Temporal)   Resp 16   Ht  (1.727 m)   Wt 179 lb (81.2 kg)   BMI 27.22 kg/m     Physical Exam:  Physical Exam  Constitutional: He is oriented to person, place, and time. He appears well-developed.  Eyes: Pupils are equal, round, and reactive to light.  Neck: Normal range of motion.  Cardiovascular: Normal rate.  Pulmonary/Chest: Effort normal.  Abdominal: Soft. He exhibits no distension. There is no tenderness.  Staples removed, no erythema or drainage, steri strips applied   Musculoskeletal: Normal range of motion.  Neurological: He is alert and oriented to person, place, and time.  Vitals reviewed.   Laboratory studies: None   Imaging:  None    Assessment:  67 y.o. yo Male s/p an Ex lap, SBR for partial SBO. Doing great. He is healing nicely.  Plan:  -Repeat EGD with GI being scheduled -No lifting > 10 lbs for the next 8 weeks after surgery  -Follow up PRN with me   All of the above recommendations were discussed with the patient and patient's family, and all of patient's and family's questions were answered to their expressed satisfaction.  Algis Greenhouse, MD [facility] [address] [place], [place] [address] [phone] (office)

## 2018-01-26 DIAGNOSIS — N182 Chronic kidney disease, stage 2 (mild): Secondary | ICD-10-CM | POA: Diagnosis not present

## 2018-01-26 DIAGNOSIS — M1991 Primary osteoarthritis, unspecified site: Secondary | ICD-10-CM | POA: Diagnosis not present

## 2018-01-26 DIAGNOSIS — I129 Hypertensive chronic kidney disease with stage 1 through stage 4 chronic kidney disease, or unspecified chronic kidney disease: Secondary | ICD-10-CM | POA: Diagnosis not present

## 2018-01-26 DIAGNOSIS — M4802 Spinal stenosis, cervical region: Secondary | ICD-10-CM | POA: Diagnosis not present

## 2018-01-26 DIAGNOSIS — Z48815 Encounter for surgical aftercare following surgery on the digestive system: Secondary | ICD-10-CM | POA: Diagnosis not present

## 2018-01-26 DIAGNOSIS — F0390 Unspecified dementia without behavioral disturbance: Secondary | ICD-10-CM | POA: Diagnosis not present

## 2018-01-30 DIAGNOSIS — I129 Hypertensive chronic kidney disease with stage 1 through stage 4 chronic kidney disease, or unspecified chronic kidney disease: Secondary | ICD-10-CM | POA: Diagnosis not present

## 2018-01-30 DIAGNOSIS — F0390 Unspecified dementia without behavioral disturbance: Secondary | ICD-10-CM | POA: Diagnosis not present

## 2018-01-30 DIAGNOSIS — M4802 Spinal stenosis, cervical region: Secondary | ICD-10-CM | POA: Diagnosis not present

## 2018-01-30 DIAGNOSIS — Z48815 Encounter for surgical aftercare following surgery on the digestive system: Secondary | ICD-10-CM | POA: Diagnosis not present

## 2018-01-30 DIAGNOSIS — M1991 Primary osteoarthritis, unspecified site: Secondary | ICD-10-CM | POA: Diagnosis not present

## 2018-01-30 DIAGNOSIS — N182 Chronic kidney disease, stage 2 (mild): Secondary | ICD-10-CM | POA: Diagnosis not present

## 2018-02-02 DIAGNOSIS — N182 Chronic kidney disease, stage 2 (mild): Secondary | ICD-10-CM | POA: Diagnosis not present

## 2018-02-02 DIAGNOSIS — I129 Hypertensive chronic kidney disease with stage 1 through stage 4 chronic kidney disease, or unspecified chronic kidney disease: Secondary | ICD-10-CM | POA: Diagnosis not present

## 2018-02-02 DIAGNOSIS — M1991 Primary osteoarthritis, unspecified site: Secondary | ICD-10-CM | POA: Diagnosis not present

## 2018-02-02 DIAGNOSIS — F0390 Unspecified dementia without behavioral disturbance: Secondary | ICD-10-CM | POA: Diagnosis not present

## 2018-02-02 DIAGNOSIS — M4802 Spinal stenosis, cervical region: Secondary | ICD-10-CM | POA: Diagnosis not present

## 2018-02-02 DIAGNOSIS — Z48815 Encounter for surgical aftercare following surgery on the digestive system: Secondary | ICD-10-CM | POA: Diagnosis not present

## 2018-02-03 ENCOUNTER — Ambulatory Visit: Payer: Medicare Other | Admitting: Gastroenterology

## 2018-02-08 DIAGNOSIS — F0390 Unspecified dementia without behavioral disturbance: Secondary | ICD-10-CM | POA: Diagnosis not present

## 2018-02-08 DIAGNOSIS — M1991 Primary osteoarthritis, unspecified site: Secondary | ICD-10-CM | POA: Diagnosis not present

## 2018-02-08 DIAGNOSIS — Z48815 Encounter for surgical aftercare following surgery on the digestive system: Secondary | ICD-10-CM | POA: Diagnosis not present

## 2018-02-08 DIAGNOSIS — N182 Chronic kidney disease, stage 2 (mild): Secondary | ICD-10-CM | POA: Diagnosis not present

## 2018-02-08 DIAGNOSIS — I129 Hypertensive chronic kidney disease with stage 1 through stage 4 chronic kidney disease, or unspecified chronic kidney disease: Secondary | ICD-10-CM | POA: Diagnosis not present

## 2018-02-08 DIAGNOSIS — M4802 Spinal stenosis, cervical region: Secondary | ICD-10-CM | POA: Diagnosis not present

## 2018-02-16 ENCOUNTER — Other Ambulatory Visit: Payer: Self-pay

## 2018-02-16 ENCOUNTER — Encounter (HOSPITAL_COMMUNITY): Payer: Self-pay

## 2018-02-16 ENCOUNTER — Emergency Department (HOSPITAL_COMMUNITY)
Admission: EM | Admit: 2018-02-16 | Discharge: 2018-02-16 | Disposition: A | Discharge status: Home / Self Care | Payer: Medicare Other | Attending: Emergency Medicine | Admitting: Emergency Medicine

## 2018-02-16 DIAGNOSIS — N182 Chronic kidney disease, stage 2 (mild): Secondary | ICD-10-CM | POA: Insufficient documentation

## 2018-02-16 DIAGNOSIS — R110 Nausea: Secondary | ICD-10-CM | POA: Insufficient documentation

## 2018-02-16 DIAGNOSIS — Z79899 Other long term (current) drug therapy: Secondary | ICD-10-CM | POA: Diagnosis not present

## 2018-02-16 DIAGNOSIS — R42 Dizziness and giddiness: Secondary | ICD-10-CM | POA: Diagnosis not present

## 2018-02-16 DIAGNOSIS — I129 Hypertensive chronic kidney disease with stage 1 through stage 4 chronic kidney disease, or unspecified chronic kidney disease: Secondary | ICD-10-CM | POA: Diagnosis not present

## 2018-02-16 DIAGNOSIS — Z7982 Long term (current) use of aspirin: Secondary | ICD-10-CM | POA: Diagnosis not present

## 2018-02-16 MED ORDER — MECLIZINE HCL 25 MG PO TABS: 25.0000 mg | ORAL_TABLET | Freq: Three times a day (TID) | ORAL | 0 refills | Status: DC | PRN

## 2018-02-16 MED ORDER — MECLIZINE HCL 12.5 MG PO TABS
25.0000 mg | ORAL_TABLET | Freq: Once | ORAL | Status: AC
  Administered 2018-02-16: 25 mg via ORAL
  Filled 2018-02-16: qty 2

## 2018-02-16 MED ORDER — ONDANSETRON 4 MG PO TBDP
4.0000 mg | ORAL_TABLET | Freq: Once | ORAL | Status: AC
  Administered 2018-02-16: 4 mg via ORAL
  Filled 2018-02-16: qty 1

## 2018-02-16 MED ORDER — ONDANSETRON 4 MG PO TBDP: ORAL_TABLET | ORAL | 0 refills | Status: DC

## 2018-02-16 NOTE — Discharge Instructions (Signed)
Follow-up with your family doctor next week if not improving °
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
## 2018-02-16 NOTE — ED Notes (Signed)
Pt reports having dizziness for two days.  Currently denies dizziness or discomfort.  When dizziness occurs, pt reports feeling like he is spinning and not the room.

## 2018-02-16 NOTE — ED Triage Notes (Signed)
Pt reports dizziness x 2 days.  Pt describes dizziness as room spinning.  Denies any pain but says "can feel heart beating in head."

## 2018-02-16 NOTE — ED Notes (Signed)
Currently not dizzy but has had dizziness for the pass 2 days.

## 2018-02-24 ENCOUNTER — Telehealth: Payer: Self-pay | Admitting: Nurse Practitioner

## 2018-02-24 NOTE — Telephone Encounter (Signed)
Pt's nephew said he is tells him he feels like something is moving in his brain at the top of the head and can hear a beating sound, there is no pain for the past 2 weeks intermittently. He has felt this 3 times over the past 2 weeks. He was seen at the ED on 5/30 for dizziness. Please call to advise.

## 2018-02-24 NOTE — Telephone Encounter (Signed)
I called nephew and relayed to him that I would have him evaluated at pcp first for dizziness.  Dizziness a sx and can have many causes.  The meclizine that he had in ED 5-30 has not helped.  We see for dementia.  He was aggreeable to this and will pcp.

## 2018-02-27 ENCOUNTER — Encounter (HOSPITAL_COMMUNITY): Payer: Self-pay | Admitting: Emergency Medicine

## 2018-02-27 ENCOUNTER — Emergency Department (HOSPITAL_COMMUNITY): Payer: Medicare Other

## 2018-02-27 ENCOUNTER — Emergency Department (HOSPITAL_COMMUNITY)
Admission: EM | Admit: 2018-02-27 | Discharge: 2018-02-27 | Disposition: A | Discharge status: Home / Self Care | Payer: Medicare Other | Attending: Emergency Medicine | Admitting: Emergency Medicine

## 2018-02-27 ENCOUNTER — Other Ambulatory Visit: Payer: Self-pay

## 2018-02-27 DIAGNOSIS — I714 Abdominal aortic aneurysm, without rupture: Secondary | ICD-10-CM | POA: Diagnosis not present

## 2018-02-27 DIAGNOSIS — R51 Headache: Secondary | ICD-10-CM | POA: Insufficient documentation

## 2018-02-27 DIAGNOSIS — R002 Palpitations: Secondary | ICD-10-CM

## 2018-02-27 DIAGNOSIS — N182 Chronic kidney disease, stage 2 (mild): Secondary | ICD-10-CM | POA: Diagnosis not present

## 2018-02-27 DIAGNOSIS — I129 Hypertensive chronic kidney disease with stage 1 through stage 4 chronic kidney disease, or unspecified chronic kidney disease: Secondary | ICD-10-CM | POA: Insufficient documentation

## 2018-02-27 DIAGNOSIS — R000 Tachycardia, unspecified: Secondary | ICD-10-CM | POA: Diagnosis present

## 2018-02-27 DIAGNOSIS — R42 Dizziness and giddiness: Secondary | ICD-10-CM | POA: Diagnosis not present

## 2018-02-27 LAB — BASIC METABOLIC PANEL
ANION GAP: 8 (ref 5–15)
BUN: 10 mg/dL (ref 6–20)
CALCIUM: 9.7 mg/dL (ref 8.9–10.3)
CO2: 25 mmol/L (ref 22–32)
Chloride: 109 mmol/L (ref 101–111)
Creatinine, Ser: 1.05 mg/dL (ref 0.61–1.24)
GFR calc Af Amer: >60 mL/min (ref >60)
GLUCOSE: 141 mg/dL — AB (ref 65–99)
Potassium: 3.6 mmol/L (ref 3.5–5.1)
Sodium: 142 mmol/L (ref 135–145)

## 2018-02-27 LAB — CBC
HCT: 39.1 % (ref 39.0–52.0)
HEMOGLOBIN: 12.7 g/dL — AB (ref 13.0–17.0)
MCH: 30 pg (ref 26.0–34.0)
MCHC: 32.5 g/dL (ref 30.0–36.0)
MCV: 92.4 fL (ref 78.0–100.0)
PLATELETS: 345 10*3/uL (ref 150–400)
RBC: 4.23 MIL/uL (ref 4.22–5.81)
RDW: 15 % (ref 11.5–15.5)
WBC: 7.9 10*3/uL (ref 4.0–10.5)

## 2018-02-27 LAB — I-STAT TROPONIN, ED
TROPONIN I, POC: 0 ng/mL (ref 0.00–0.08)
Troponin i, poc: 0.01 ng/mL (ref 0.00–0.08)

## 2018-02-27 MED ORDER — IOPAMIDOL (ISOVUE-370) INJECTION 76%
100.0000 mL | Freq: Once | INTRAVENOUS | Status: AC | PRN
  Administered 2018-02-27: 100 mL via INTRAVENOUS

## 2018-02-27 MED ORDER — SODIUM CHLORIDE 0.9 % IV BOLUS
500.0000 mL | Freq: Once | INTRAVENOUS | Status: AC
  Administered 2018-02-27: 500 mL via INTRAVENOUS

## 2018-02-27 NOTE — ED Triage Notes (Signed)
Pt reports increase heart rate since 10am. Pt reports has had headache(seen for this last week in ED) and dizziness for one week.denies SOB.

## 2018-02-27 NOTE — Discharge Instructions (Addendum)
Follow-up with the local heart doctor to discuss your heart racing sensations. Return to the ER if you develop persistent chest pain, shortness of breath, passout or new concerns.
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
## 2018-03-06 DIAGNOSIS — R51 Headache: Secondary | ICD-10-CM | POA: Diagnosis not present

## 2018-03-06 DIAGNOSIS — Z6827 Body mass index (BMI) 27.0-27.9, adult: Secondary | ICD-10-CM | POA: Diagnosis not present

## 2018-03-06 DIAGNOSIS — Z1389 Encounter for screening for other disorder: Secondary | ICD-10-CM | POA: Diagnosis not present

## 2018-03-06 DIAGNOSIS — R000 Tachycardia, unspecified: Secondary | ICD-10-CM | POA: Diagnosis not present

## 2018-03-06 DIAGNOSIS — E663 Overweight: Secondary | ICD-10-CM | POA: Diagnosis not present
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
## 2018-03-21 ENCOUNTER — Ambulatory Visit (INDEPENDENT_AMBULATORY_CARE_PROVIDER_SITE_OTHER): Payer: Medicare Other | Admitting: Student

## 2018-03-21 ENCOUNTER — Encounter: Payer: Self-pay | Admitting: Student

## 2018-03-21 VITALS — BP 122/86 | HR 58 | Ht 68.0 in | Wt 182.2 lb

## 2018-03-21 DIAGNOSIS — R0789 Other chest pain: Secondary | ICD-10-CM

## 2018-03-21 DIAGNOSIS — Z6827 Body mass index (BMI) 27.0-27.9, adult: Secondary | ICD-10-CM | POA: Diagnosis not present

## 2018-03-21 DIAGNOSIS — Z0000 Encounter for general adult medical examination without abnormal findings: Secondary | ICD-10-CM | POA: Diagnosis not present

## 2018-03-21 DIAGNOSIS — Z1389 Encounter for screening for other disorder: Secondary | ICD-10-CM | POA: Diagnosis not present

## 2018-03-21 DIAGNOSIS — Z0001 Encounter for general adult medical examination with abnormal findings: Secondary | ICD-10-CM | POA: Diagnosis not present

## 2018-03-21 DIAGNOSIS — E785 Hyperlipidemia, unspecified: Secondary | ICD-10-CM | POA: Diagnosis not present

## 2018-03-21 DIAGNOSIS — R002 Palpitations: Secondary | ICD-10-CM | POA: Diagnosis not present

## 2018-03-21 DIAGNOSIS — I10 Essential (primary) hypertension: Secondary | ICD-10-CM | POA: Diagnosis not present

## 2018-03-21 DIAGNOSIS — E663 Overweight: Secondary | ICD-10-CM | POA: Diagnosis not present

## 2018-03-21 NOTE — Patient Instructions (Signed)
Medication Instructions:  Your physician recommends that you continue on your current medications as directed. Please refer to the Current Medication list given to you today.   Labwork: NONE   Testing/Procedures: Your physician has recommended that you wear a holter monitor. Holter monitors are medical devices that record the heart's electrical activity. Doctors most often use these monitors to diagnose arrhythmias. Arrhythmias are problems with the speed or rhythm of the heartbeat. The monitor is a small, portable device. You can wear one while you do your normal daily activities. This is usually used to diagnose what is causing palpitations/syncope (passing out).    Follow-Up: Your physician wants you to follow-up in: 6 Months. You will receive a reminder letter in the mail two months in advance. If you don't receive a letter, please call our office to schedule the follow-up appointment.   Any Other Special Instructions Will Be Listed Below (If Applicable).     If you need a refill on your cardiac medications before your next appointment, please call your pharmacy.  Thank you for choosing  [facility]!

## 2018-03-22 ENCOUNTER — Ambulatory Visit (HOSPITAL_COMMUNITY)
Admission: RE | Admit: 2018-03-22 | Discharge: 2018-03-22 | Disposition: A | Discharge status: Home / Self Care | Payer: Medicare Other | Source: Ambulatory Visit | Attending: Student | Admitting: Student

## 2018-03-22 DIAGNOSIS — R002 Palpitations: Secondary | ICD-10-CM | POA: Insufficient documentation

## 2018-03-24 ENCOUNTER — Other Ambulatory Visit: Payer: Self-pay | Admitting: Adult Health

## 2018-04-05 ENCOUNTER — Encounter: Payer: Self-pay | Admitting: *Deleted

## 2018-04-05 ENCOUNTER — Ambulatory Visit (INDEPENDENT_AMBULATORY_CARE_PROVIDER_SITE_OTHER): Payer: Medicare Other | Admitting: Gastroenterology

## 2018-04-05 ENCOUNTER — Other Ambulatory Visit: Payer: Self-pay | Admitting: *Deleted

## 2018-04-05 ENCOUNTER — Encounter

## 2018-04-05 ENCOUNTER — Encounter: Payer: Self-pay | Admitting: Gastroenterology

## 2018-04-05 DIAGNOSIS — K259 Gastric ulcer, unspecified as acute or chronic, without hemorrhage or perforation: Secondary | ICD-10-CM | POA: Diagnosis not present

## 2018-04-05 DIAGNOSIS — K769 Liver disease, unspecified: Secondary | ICD-10-CM

## 2018-04-05 NOTE — Assessment & Plan Note (Addendum)
Very pleasant 67 year old gentleman with history of dark stools and declining hemoglobin 4 months ago, hospitalized prior to work-up due to small bowel obstruction which required surgery.  During that hospitalization due to abnormal stomach on CT he did have an EGD showing markedly abnormal antral/prepyloric mucosa with glandular/adenomatous change well-demarcated circumferentially going into the pyloric channel, centrally located 1.5 cm deep ulcer.  Pyloric channel mildly stenosed.  Biopsy was benign.    Presents back today clinically doing very well. No longer on NSAIDS but not on PPI. Due for surveillance EGD.  Did well with conscious sedation previously. I have discussed the risks, alternatives, benefits with regards to but not limited to the risk of reaction to medication, bleeding, infection, perforation and the patient is agreeable to proceed. Written consent to be obtained.

## 2018-04-05 NOTE — Patient Instructions (Signed)
1. Upper endoscopy as scheduled. See separate instructions.  

## 2018-04-05 NOTE — Assessment & Plan Note (Signed)
Small 9mm liver lesion at hepatic dome on CT. Will review with radiology for recommendations regarding any necessary follow up.
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
## 2018-04-05 NOTE — Progress Notes (Signed)
cc'ed to pcp °

## 2018-04-07 ENCOUNTER — Other Ambulatory Visit: Payer: Self-pay

## 2018-04-07 ENCOUNTER — Encounter (HOSPITAL_COMMUNITY)
Admission: RE | Disposition: A | Discharge status: Home Health | Payer: Self-pay | Source: Ambulatory Visit | Attending: Internal Medicine

## 2018-04-07 ENCOUNTER — Ambulatory Visit (HOSPITAL_COMMUNITY)
Admission: RE | Admit: 2018-04-07 | Discharge: 2018-04-07 | Disposition: A | Discharge status: Home Health | Payer: Medicare Other | Source: Ambulatory Visit | Attending: Internal Medicine | Admitting: Internal Medicine

## 2018-04-07 ENCOUNTER — Encounter (HOSPITAL_COMMUNITY): Payer: Self-pay

## 2018-04-07 DIAGNOSIS — Z981 Arthrodesis status: Secondary | ICD-10-CM | POA: Diagnosis not present

## 2018-04-07 DIAGNOSIS — Z833 Family history of diabetes mellitus: Secondary | ICD-10-CM | POA: Diagnosis not present

## 2018-04-07 DIAGNOSIS — K209 Esophagitis, unspecified: Secondary | ICD-10-CM | POA: Diagnosis not present

## 2018-04-07 DIAGNOSIS — I714 Abdominal aortic aneurysm, without rupture: Secondary | ICD-10-CM | POA: Insufficient documentation

## 2018-04-07 DIAGNOSIS — Z79899 Other long term (current) drug therapy: Secondary | ICD-10-CM | POA: Insufficient documentation

## 2018-04-07 DIAGNOSIS — K210 Gastro-esophageal reflux disease with esophagitis: Secondary | ICD-10-CM | POA: Diagnosis not present

## 2018-04-07 DIAGNOSIS — Z9889 Other specified postprocedural states: Secondary | ICD-10-CM | POA: Diagnosis not present

## 2018-04-07 DIAGNOSIS — K449 Diaphragmatic hernia without obstruction or gangrene: Secondary | ICD-10-CM | POA: Diagnosis not present

## 2018-04-07 DIAGNOSIS — K3189 Other diseases of stomach and duodenum: Secondary | ICD-10-CM

## 2018-04-07 DIAGNOSIS — Z823 Family history of stroke: Secondary | ICD-10-CM | POA: Diagnosis not present

## 2018-04-07 DIAGNOSIS — R4182 Altered mental status, unspecified: Secondary | ICD-10-CM | POA: Diagnosis not present

## 2018-04-07 DIAGNOSIS — I10 Essential (primary) hypertension: Secondary | ICD-10-CM | POA: Diagnosis not present

## 2018-04-07 DIAGNOSIS — R413 Other amnesia: Secondary | ICD-10-CM | POA: Insufficient documentation

## 2018-04-07 DIAGNOSIS — Z7982 Long term (current) use of aspirin: Secondary | ICD-10-CM | POA: Diagnosis not present

## 2018-04-07 DIAGNOSIS — Z8249 Family history of ischemic heart disease and other diseases of the circulatory system: Secondary | ICD-10-CM | POA: Insufficient documentation

## 2018-04-07 DIAGNOSIS — M4800 Spinal stenosis, site unspecified: Secondary | ICD-10-CM | POA: Diagnosis not present

## 2018-04-07 DIAGNOSIS — K259 Gastric ulcer, unspecified as acute or chronic, without hemorrhage or perforation: Secondary | ICD-10-CM

## 2018-04-07 DIAGNOSIS — Z13810 Encounter for screening for upper gastrointestinal disorder: Secondary | ICD-10-CM

## 2018-04-07 DIAGNOSIS — M1990 Unspecified osteoarthritis, unspecified site: Secondary | ICD-10-CM | POA: Diagnosis not present

## 2018-04-07 DIAGNOSIS — Z8711 Personal history of peptic ulcer disease: Secondary | ICD-10-CM | POA: Diagnosis not present

## 2018-04-07 HISTORY — PX: ESOPHAGOGASTRODUODENOSCOPY: SHX5428

## 2018-04-07 SURGERY — EGD (ESOPHAGOGASTRODUODENOSCOPY)
Anesthesia: Moderate Sedation

## 2018-04-07 MED ORDER — MIDAZOLAM HCL 5 MG/5ML IJ SOLN
INTRAMUSCULAR | Status: DC | PRN
  Administered 2018-04-07 (×2): 1 mg via INTRAVENOUS

## 2018-04-07 MED ORDER — LIDOCAINE VISCOUS HCL 2 % MT SOLN
OROMUCOSAL | Status: DC | PRN
  Administered 2018-04-07: 5 mL via OROMUCOSAL

## 2018-04-07 MED ORDER — LIDOCAINE VISCOUS HCL 2 % MT SOLN
OROMUCOSAL | Status: AC
  Filled 2018-04-07: qty 15

## 2018-04-07 MED ORDER — ONDANSETRON HCL 4 MG/2ML IJ SOLN
INTRAMUSCULAR | Status: AC
  Filled 2018-04-07: qty 2

## 2018-04-07 MED ORDER — MEPERIDINE HCL 100 MG/ML IJ SOLN
INTRAMUSCULAR | Status: DC | PRN
  Administered 2018-04-07 (×2): 25 mg via INTRAVENOUS

## 2018-04-07 MED ORDER — SODIUM CHLORIDE 0.9 % IV SOLN
INTRAVENOUS | Status: DC
  Administered 2018-04-07: 13:00:00 via INTRAVENOUS

## 2018-04-07 MED ORDER — STERILE WATER FOR IRRIGATION IR SOLN
Status: DC | PRN
  Administered 2018-04-07: 2.5 mL

## 2018-04-07 MED ORDER — MIDAZOLAM HCL 5 MG/5ML IJ SOLN
INTRAMUSCULAR | Status: AC
  Filled 2018-04-07: qty 10

## 2018-04-07 MED ORDER — ONDANSETRON HCL 4 MG/2ML IJ SOLN
INTRAMUSCULAR | Status: DC | PRN
  Administered 2018-04-07: 4 mg via INTRAVENOUS

## 2018-04-07 MED ORDER — MEPERIDINE HCL 100 MG/ML IJ SOLN
INTRAMUSCULAR | Status: AC
  Filled 2018-04-07: qty 2

## 2018-04-07 NOTE — Interval H&P Note (Signed)
History and Physical Interval Note:  04/07/2018 2:19 PM  Thomas [name]  has presented today for surgery, with the diagnosis of gastric ulcer  The various methods of treatment have been discussed with the patient and family. After consideration of risks, benefits and other options for treatment, the patient has consented to  Procedure(s) with comments: ESOPHAGOGASTRODUODENOSCOPY (EGD) (N/A) - 2:15pm as a surgical intervention .  The patient's history has been reviewed, patient examined, no change in status, stable for surgery.  I have reviewed the patient's chart and labs.  Questions were answered to the patient's satisfaction.     Thomas [name]  No change. Surveillance EGD per plan.  The risks, benefits, limitations, alternatives and imponderables have been reviewed with the patient. Potential for esophageal dilation, biopsy, etc. have also been reviewed.  Questions have been answered. All parties agreeable.

## 2018-04-07 NOTE — Discharge Instructions (Signed)
EGD Discharge instructions Please read the instructions outlined below and refer to this sheet in the next few weeks. These discharge instructions provide you with general information on caring for yourself after you leave the hospital. Your doctor may also give you specific instructions. While your treatment has been planned according to the most current medical practices available, unavoidable complications occasionally occur. If you have any problems or questions after discharge, please call your doctor. ACTIVITY  You may resume your regular activity but move at a slower pace for the next 24 hours.   Take frequent rest periods for the next 24 hours.   Walking will help expel (get rid of) the air and reduce the bloated feeling in your abdomen.   No driving for 24 hours (because of the anesthesia (medicine) used during the test).   You may shower.   Do not sign any important legal documents or operate any machinery for 24 hours (because of the anesthesia used during the test).  NUTRITION  Drink plenty of fluids.   You may resume your normal diet.   Begin with a light meal and progress to your normal diet.   Avoid alcoholic beverages for 24 hours or as instructed by your caregiver.  MEDICATIONS  You may resume your normal medications unless your caregiver tells you otherwise.  WHAT YOU CAN EXPECT TODAY  You may experience abdominal discomfort such as a feeling of fullness or gas pains.  FOLLOW-UP  Your doctor will discuss the results of your test with you.  SEEK IMMEDIATE MEDICAL ATTENTION IF ANY OF THE FOLLOWING OCCUR:  Excessive nausea (feeling sick to your stomach) and/or vomiting.   Severe abdominal pain and distention (swelling).   Trouble swallowing.   Temperature over 101 F (37.8 C).   Rectal bleeding or vomiting of blood.    GERD information provided  Omeprazole 40 mg daily  Avoid aspirin and nonsteroidals going forward (these may take an 81 mg aspirin  daily )    Gastroesophageal Reflux Disease, Adult Normally, food travels down the esophagus and stays in the stomach to be digested. However, when a person has gastroesophageal reflux disease (GERD), food and stomach acid move back up into the esophagus. When this happens, the esophagus becomes sore and inflamed. Over time, GERD can create small holes (ulcers) in the lining of the esophagus. What are the causes? This condition is caused by a problem with the muscle between the esophagus and the stomach (lower esophageal sphincter, or LES). Normally, the LES muscle closes after food passes through the esophagus to the stomach. When the LES is weakened or abnormal, it does not close properly, and that allows food and stomach acid to go back up into the esophagus. The LES can be weakened by certain dietary substances, medicines, and medical conditions, including:  Tobacco use.  Pregnancy.  Having a hiatal hernia.  Heavy alcohol use.  Certain foods and beverages, such as coffee, chocolate, onions, and peppermint.  What increases the risk? This condition is more likely to develop in:  People who have an increased body weight.  People who have connective tissue disorders.  People who use NSAID medicines.  What are the signs or symptoms? Symptoms of this condition include:  Heartburn.  Difficult or painful swallowing.  The feeling of having a lump in the throat.  Abitter taste in the mouth.  Bad breath.  Having a large amount of saliva.  Having an upset or bloated stomach.  Belching.  Chest pain.  Shortness of  breath or wheezing.  Ongoing (chronic) cough or a night-time cough.  Wearing away of tooth enamel.  Weight loss.  Different conditions can cause chest pain. Make sure to see your health care provider if you experience chest pain. How is this diagnosed? Your health care provider will take a medical history and perform a physical exam. To determine if you have  mild or severe GERD, your health care provider may also monitor how you respond to treatment. You may also have other tests, including:  An endoscopy toexamine your stomach and esophagus with a small camera.  A test thatmeasures the acidity level in your esophagus.  A test thatmeasures how much pressure is on your esophagus.  A barium swallow or modified barium swallow to show the shape, size, and functioning of your esophagus.  How is this treated? The goal of treatment is to help relieve your symptoms and to prevent complications. Treatment for this condition may vary depending on how severe your symptoms are. Your health care provider may recommend:  Changes to your diet.  Medicine.  Surgery.  Follow these instructions at home: Diet  Follow a diet as recommended by your health care provider. This may involve avoiding foods and drinks such as: ? Coffee and tea (with or without caffeine). ? Drinks that containalcohol. ? Energy drinks and sports drinks. ? Carbonated drinks or sodas. ? Chocolate and cocoa. ? Peppermint and mint flavorings. ? Garlic and onions. ? Horseradish. ? Spicy and acidic foods, including peppers, chili powder, curry powder, vinegar, hot sauces, and barbecue sauce. ? Citrus fruit juices and citrus fruits, such as oranges, lemons, and limes. ? Tomato-based foods, such as red sauce, chili, salsa, and pizza with red sauce. ? Fried and fatty foods, such as donuts, french fries, potato chips, and high-fat dressings. ? High-fat meats, such as hot dogs and fatty cuts of red and white meats, such as rib eye steak, sausage, ham, and bacon. ? High-fat dairy items, such as whole milk, butter, and cream cheese.  Eat small, frequent meals instead of large meals.  Avoid drinking large amounts of liquid with your meals.  Avoid eating meals during the 2-3 hours before bedtime.  Avoid lying down right after you eat.  Do not exercise right after you eat. General  instructions  Pay attention to any changes in your symptoms.  Take over-the-counter and prescription medicines only as told by your health care provider. Do not take aspirin, ibuprofen, or other NSAIDs unless your health care provider told you to do so.  Do not use any tobacco products, including cigarettes, chewing tobacco, and e-cigarettes. If you need help quitting, ask your health care provider.  Wear loose-fitting clothing. Do not wear anything tight around your waist that causes pressure on your abdomen.  Raise (elevate) the head of your bed 6 inches (15cm).  Try to reduce your stress, such as with yoga or meditation. If you need help reducing stress, ask your health care provider.  If you are overweight, reduce your weight to an amount that is healthy for you. Ask your health care provider for guidance about a safe weight loss goal.  Keep all follow-up visits as told by your health care provider. This is important. Contact a health care provider if:  You have new symptoms.  You have unexplained weight loss.  You have difficulty swallowing, or it hurts to swallow.  You have wheezing or a persistent cough.  Your symptoms do not improve with treatment.  You have a  hoarse voice. Get help right away if:  You have pain in your arms, neck, jaw, teeth, or back.  You feel sweaty, dizzy, or light-headed.  You have chest pain or shortness of breath.  You vomit and your vomit looks like blood or coffee grounds.  You faint.  Your stool is bloody or black.  You cannot swallow, drink, or eat.   Office visit with [place] in 6 months  [phone]

## 2018-04-07 NOTE — Op Note (Signed)
[facility] Patient Name: Thomas [name] Procedure Date: 04/07/2018 2:16 PM MRN: [id number] Date of Birth: 06-08-1951 Attending MD: Gennette Pac , MD CSN: [id number] Age: 67 Admit Type: Outpatient Procedure:                Upper GI endoscopy Indications:              , Surveillance procedure Providers:                Gennette Pac, MD, [place] Scrape RN,                            RN, Edythe Clarity, Technician Referring MD:              Medicines:                Midazolam 2 mg IV, Meperidine 50 mg IV Complications:            No immediate complications. Estimated Blood Loss:     Estimated blood loss: none. Procedure:                Pre-Anesthesia Assessment:                           - Prior to the procedure, a History and Physical                            was performed, and patient medications and                            allergies were reviewed. The patient's tolerance of                            previous anesthesia was also reviewed. The risks                            and benefits of the procedure and the sedation                            options and risks were discussed with the patient.                            All questions were answered, and informed consent                            was obtained. Prior Anticoagulants: The patient has                            taken no previous anticoagulant or antiplatelet                            agents. ASA Grade Assessment: II - A patient with                            mild systemic disease. After reviewing the risks  and benefits, the patient was deemed in                            satisfactory condition to undergo the procedure.                           After obtaining informed consent, the endoscope was                            passed under direct vision. Throughout the                            procedure, the patient's blood pressure, pulse, and                             oxygen saturations were monitored continuously. The                            [id number]-[id number] ([id number] scope was introduced through the                            mouth, and advanced to the second part of duodenum.                            The upper GI endoscopy was accomplished without                            difficulty. The patient tolerated the procedure                            well. Scope In: 2:29:26 PM Scope Out: 2:32:18 PM Total Procedure Duration: 0 hours 2 minutes 52 seconds  Findings:      Distal esophageal erosions within 5 mm the GE junction. was found.      A small hiatal hernia was present.      Erythematous / edematous antral mucosal folds. Previously noted ulcer       has healed. Patient had a couple of the areas of erosion in this area.       No infiltrating process seen. Patent pylorus.mucosa was found in the       gastric antrum.      The duodenal bulb and second portion of the duodenum were normal. Impression:               - Mild reflux esophagitis.Esophagitis.                           - Small hiatal hernia.                           - Erythematous/ edematous mucosa in the antrum.                            gastric erosions. Gastric ulcer crater previously  noted has healed.                           - Normal duodenal bulb and second portion of the                            duodenum.                           - No specimens collected. Moderate Sedation:      Moderate (conscious) sedation was administered by the endoscopy nurse       and supervised by the endoscopist. The following parameters were       monitored: oxygen saturation, heart rate, blood pressure, respiratory       rate, EKG, adequacy of pulmonary ventilation, and response to care.       Total physician intraservice time was 9 minutes. Recommendation:           - Patient has a contact number available for                            emergencies. The signs and symptoms of  potential                            delayed complications were discussed with the                            patient. Return to normal activities tomorrow.                            Written discharge instructions were provided to the                            patient.                           - Resume previous diet. Avoid all nonsteroidal                            agents beyond 1 baby aspirin daily. Omeprazole 40                            mg orally daily. Office visit with [place] in 6 months.                           - Patient has a contact number available for                            emergencies. The signs and symptoms of potential                            delayed complications were discussed with the                            patient. Return to normal activities tomorrow.  Written discharge instructions were provided to the                            patient.                           - Continue present medications. Procedure Code(s):        --- Professional ---                           [phone], Esophagogastroduodenoscopy, flexible,                            transoral; diagnostic, including collection of                            specimen(s) by brushing or washing, when performed                            (separate procedure) Diagnosis Code(s):        --- Professional ---                           K20.9, Esophagitis, unspecified                           K44.9, Diaphragmatic hernia without obstruction or                            gangrene                           K31.89, Other diseases of stomach and duodenum                           Z13.810, Encounter for screening for upper                            gastrointestinal disorder CPT copyright 2017 [employer]. All rights reserved. The codes documented in this report are preliminary and upon coder review may  be revised to meet current compliance requirements. Gerrit Friends. Jeremih Dearmas,  MD Gennette Pac, MD 04/07/2018 2:41:12 PM This report has been signed electronically. Number of Addenda: 0

## 2018-04-12 ENCOUNTER — Encounter (HOSPITAL_COMMUNITY): Payer: Self-pay | Admitting: Internal Medicine
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
## 2018-04-26 ENCOUNTER — Ambulatory Visit (INDEPENDENT_AMBULATORY_CARE_PROVIDER_SITE_OTHER): Payer: Medicare Other | Admitting: Nurse Practitioner

## 2018-04-26 ENCOUNTER — Encounter: Payer: Self-pay | Admitting: Nurse Practitioner

## 2018-04-26 VITALS — BP 127/79 | HR 57 | Ht 67.0 in | Wt 183.8 lb

## 2018-04-26 DIAGNOSIS — F0390 Unspecified dementia without behavioral disturbance: Secondary | ICD-10-CM

## 2018-04-26 MED ORDER — DONEPEZIL HCL 5 MG PO TABS: 5.0000 mg | ORAL_TABLET | Freq: Every day | ORAL | 8 refills | Status: DC

## 2018-04-26 NOTE — Patient Instructions (Signed)
Continue  Aricept at 5 mg daily with food will refill Stay well-hydrated Exercise daily by walking Healthy diet fresh fruits and vegetables Follow a routine,  Follow-up in 6 months

## 2018-05-02 ENCOUNTER — Encounter: Payer: Self-pay | Admitting: Internal Medicine

## 2018-05-05 ENCOUNTER — Other Ambulatory Visit: Payer: Self-pay

## 2018-05-05 ENCOUNTER — Encounter (HOSPITAL_COMMUNITY): Payer: Self-pay | Admitting: Emergency Medicine

## 2018-05-05 ENCOUNTER — Emergency Department (HOSPITAL_COMMUNITY)
Admission: EM | Admit: 2018-05-05 | Discharge: 2018-05-05 | Disposition: A | Discharge status: Home / Self Care | Payer: Medicare Other | Attending: Emergency Medicine | Admitting: Emergency Medicine

## 2018-05-05 ENCOUNTER — Emergency Department (HOSPITAL_COMMUNITY): Payer: Medicare Other

## 2018-05-05 DIAGNOSIS — M545 Low back pain, unspecified: Secondary | ICD-10-CM

## 2018-05-05 DIAGNOSIS — I129 Hypertensive chronic kidney disease with stage 1 through stage 4 chronic kidney disease, or unspecified chronic kidney disease: Secondary | ICD-10-CM | POA: Insufficient documentation

## 2018-05-05 DIAGNOSIS — Z79899 Other long term (current) drug therapy: Secondary | ICD-10-CM | POA: Diagnosis not present

## 2018-05-05 DIAGNOSIS — R51 Headache: Secondary | ICD-10-CM | POA: Insufficient documentation

## 2018-05-05 DIAGNOSIS — N182 Chronic kidney disease, stage 2 (mild): Secondary | ICD-10-CM | POA: Diagnosis not present

## 2018-05-05 DIAGNOSIS — Z7982 Long term (current) use of aspirin: Secondary | ICD-10-CM | POA: Insufficient documentation

## 2018-05-05 DIAGNOSIS — R519 Headache, unspecified: Secondary | ICD-10-CM

## 2018-05-05 LAB — URINALYSIS, ROUTINE W REFLEX MICROSCOPIC
BACTERIA UA: NONE SEEN
BILIRUBIN URINE: NEGATIVE
Glucose, UA: NEGATIVE mg/dL
HGB URINE DIPSTICK: NEGATIVE
KETONES UR: NEGATIVE mg/dL
LEUKOCYTES UA: NEGATIVE
Nitrite: NEGATIVE
Protein, ur: 30 mg/dL — AB
SPECIFIC GRAVITY, URINE: 1.017 (ref 1.005–1.030)
pH: 6 (ref 5.0–8.0)

## 2018-05-05 LAB — CBC WITH DIFFERENTIAL/PLATELET
BASOS PCT: 1 %
Basophils Absolute: 0 10*3/uL (ref 0.0–0.1)
EOS PCT: 2 %
Eosinophils Absolute: 0.1 10*3/uL (ref 0.0–0.7)
HCT: 40.6 % (ref 39.0–52.0)
Hemoglobin: 13.7 g/dL (ref 13.0–17.0)
LYMPHS ABS: 1.6 10*3/uL (ref 0.7–4.0)
Lymphocytes Relative: 28 %
MCH: 30.3 pg (ref 26.0–34.0)
MCHC: 33.7 g/dL (ref 30.0–36.0)
MCV: 89.8 fL (ref 78.0–100.0)
MONOS PCT: 5 %
Monocytes Absolute: 0.3 10*3/uL (ref 0.1–1.0)
NEUTROS PCT: 64 %
Neutro Abs: 3.8 10*3/uL (ref 1.7–7.7)
PLATELETS: 249 10*3/uL (ref 150–400)
RBC: 4.52 MIL/uL (ref 4.22–5.81)
RDW: 14.1 % (ref 11.5–15.5)
WBC: 5.9 10*3/uL (ref 4.0–10.5)

## 2018-05-05 LAB — BASIC METABOLIC PANEL
Anion gap: 7 (ref 5–15)
BUN: 10 mg/dL (ref 8–23)
CALCIUM: 9.2 mg/dL (ref 8.9–10.3)
CO2: 22 mmol/L (ref 22–32)
CREATININE: 1.11 mg/dL (ref 0.61–1.24)
Chloride: 109 mmol/L (ref 98–111)
Glucose, Bld: 128 mg/dL — ABNORMAL HIGH (ref 70–99)
Potassium: 3.4 mmol/L — ABNORMAL LOW (ref 3.5–5.1)
Sodium: 138 mmol/L (ref 135–145)

## 2018-05-05 MED ORDER — TRAMADOL HCL 50 MG PO TABS
100.0000 mg | ORAL_TABLET | Freq: Once | ORAL | Status: AC
Start: 2018-05-05 — End: 2018-05-05
  Administered 2018-05-05: 100 mg via ORAL
  Filled 2018-05-05: qty 2

## 2018-05-05 MED ORDER — METHOCARBAMOL 500 MG PO TABS: 500.0000 mg | ORAL_TABLET | Freq: Two times a day (BID) | ORAL | 0 refills | Status: DC

## 2018-05-05 MED ORDER — METHOCARBAMOL 500 MG PO TABS
500.0000 mg | ORAL_TABLET | Freq: Once | ORAL | Status: AC
  Administered 2018-05-05: 500 mg via ORAL
  Filled 2018-05-05: qty 1

## 2018-05-05 NOTE — Discharge Instructions (Addendum)
Your lab work is nonacute at this time.  Your CT scan of the head is negative for acute intracerebral problem.  Please use Robaxin 2 times daily with food for assistance with your back pain.  Please use Tylenol every 4 hours also for assistance with your back pain and headache.  Please see the Mr. Lenise HeraldBenjamin Mann in the office for assistance and follow-up of these issues.
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
## 2018-05-05 NOTE — ED Triage Notes (Addendum)
Patient complaining of back pain, headache, and generalized weakness x 2 days. Patient ambulated to room with no difficulty or assistance. Denies dysuria, vomiting, or diarrhea.

## 2018-05-11 DIAGNOSIS — S39012D Strain of muscle, fascia and tendon of lower back, subsequent encounter: Secondary | ICD-10-CM | POA: Diagnosis not present

## 2018-05-11 DIAGNOSIS — Z6828 Body mass index (BMI) 28.0-28.9, adult: Secondary | ICD-10-CM | POA: Diagnosis not present

## 2018-05-11 DIAGNOSIS — E663 Overweight: Secondary | ICD-10-CM | POA: Diagnosis not present

## 2018-05-31 DIAGNOSIS — H40013 Open angle with borderline findings, low risk, bilateral: Secondary | ICD-10-CM | POA: Diagnosis not present

## 2018-05-31 DIAGNOSIS — H2513 Age-related nuclear cataract, bilateral: Secondary | ICD-10-CM | POA: Diagnosis not present

## 2018-05-31 DIAGNOSIS — H50111 Monocular exotropia, right eye: Secondary | ICD-10-CM | POA: Diagnosis not present

## 2018-05-31 DIAGNOSIS — H5340 Unspecified visual field defects: Secondary | ICD-10-CM | POA: Diagnosis not present

## 2018-06-26 DIAGNOSIS — Z23 Encounter for immunization: Secondary | ICD-10-CM | POA: Diagnosis not present

## 2018-06-26 DIAGNOSIS — Z6828 Body mass index (BMI) 28.0-28.9, adult: Secondary | ICD-10-CM | POA: Diagnosis not present

## 2018-06-26 DIAGNOSIS — E876 Hypokalemia: Secondary | ICD-10-CM | POA: Diagnosis not present

## 2018-06-26 DIAGNOSIS — E663 Overweight: Secondary | ICD-10-CM | POA: Diagnosis not present

## 2018-07-04 ENCOUNTER — Other Ambulatory Visit: Payer: Self-pay | Admitting: Adult Health

## 2018-07-04 NOTE — Telephone Encounter (Signed)
Rx has been sent to the pharmacy electronically. ° °
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
## 2018-09-21 ENCOUNTER — Ambulatory Visit (INDEPENDENT_AMBULATORY_CARE_PROVIDER_SITE_OTHER): Payer: Medicare Other | Admitting: Student

## 2018-09-21 ENCOUNTER — Encounter: Payer: Self-pay | Admitting: Student

## 2018-09-21 VITALS — BP 122/70 | HR 64 | Ht 68.0 in | Wt 191.0 lb

## 2018-09-21 DIAGNOSIS — R002 Palpitations: Secondary | ICD-10-CM

## 2018-09-21 DIAGNOSIS — R0789 Other chest pain: Secondary | ICD-10-CM | POA: Diagnosis not present

## 2018-09-21 DIAGNOSIS — F0390 Unspecified dementia without behavioral disturbance: Secondary | ICD-10-CM

## 2018-09-21 DIAGNOSIS — I10 Essential (primary) hypertension: Secondary | ICD-10-CM

## 2018-09-21 NOTE — Patient Instructions (Signed)
Medication Instructions:  Your physician recommends that you continue on your current medications as directed. Please refer to the Current Medication list given to you today.  If you need a refill on your cardiac medications before your next appointment, please call your pharmacy.   Lab work: NONE   If you have labs (blood work) drawn today and your tests are completely normal, you will receive your results only by: . MyChart Message (if you have MyChart) OR . A paper copy in the mail If you have any lab test that is abnormal or we need to change your treatment, we will call you to review the results.  Testing/Procedures: NONE   Follow-Up: At [facility], you and your health needs are our priority.  As part of our continuing mission to provide you with exceptional heart care, we have created designated Provider Care Teams.  These Care Teams include your primary Cardiologist (physician) and Advanced Practice Providers (APPs -  Physician Assistants and Nurse Practitioners) who all work together to provide you with the care you need, when you need it. You will need a follow up appointment in 6 months.  Please call our office 2 months in advance to schedule this appointment.  You may see Branch, Jonathan, MD or one of the following Advanced Practice Providers on your designated Care Team:   Brittany Strader, PA-C ([place] Office) . Michele Lenze, PA-C ([place] Office)  Any Other Special Instructions Will Be Listed Below (If Applicable). Thank you for choosing [facility]!     

## 2018-09-28 ENCOUNTER — Other Ambulatory Visit: Payer: Self-pay | Admitting: Internal Medicine

## 2018-10-10 ENCOUNTER — Encounter: Payer: Self-pay | Admitting: Gastroenterology

## 2018-10-17 ENCOUNTER — Other Ambulatory Visit: Payer: Self-pay | Admitting: Neurology

## 2018-10-24 DIAGNOSIS — E876 Hypokalemia: Secondary | ICD-10-CM | POA: Diagnosis not present

## 2018-10-30 ENCOUNTER — Ambulatory Visit (INDEPENDENT_AMBULATORY_CARE_PROVIDER_SITE_OTHER): Payer: Medicare Other | Admitting: Neurology

## 2018-10-30 ENCOUNTER — Encounter: Payer: Self-pay | Admitting: Neurology

## 2018-10-30 VITALS — BP 155/83 | HR 60 | Ht 68.0 in | Wt 194.0 lb

## 2018-10-30 DIAGNOSIS — R413 Other amnesia: Secondary | ICD-10-CM

## 2018-10-30 MED ORDER — DONEPEZIL HCL 10 MG PO TABS: 10.0000 mg | ORAL_TABLET | Freq: Every day | ORAL | 3 refills | Status: DC
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
## 2018-10-30 NOTE — Patient Instructions (Signed)
I had a long discussion with the patient and his son regarding his memory loss and mild dementia which appears to be stable.  I recommend he increase the dose of Aricept to 10 mg daily which is optimal dose as he seems to be tolerating it well without side effects.  Continue participating in cognitively challenging activities like solving crossword puzzles, playing bridge and sudoku.  We also discussed memory compensation strategies.  He will return for follow-up in the future in 3 months with my nurse practitioner Shanda Bumps or call earlier if necessary. Memory Compensation Strategies  1. Use "WARM" strategy.  W= write it down  A= associate it  R= repeat it  M= make a mental note  2.   You can keep a Glass blower/designer.  Use a 3-ring notebook with sections for the following: calendar, important names and phone numbers,  medications, doctors' names/phone numbers, lists/reminders, and a section to journal what you did  each day.   3.    Use a calendar to write appointments down.  4.    Write yourself a schedule for the day.  This can be placed on the calendar or in a separate section of the Memory Notebook.  Keeping a  regular schedule can help memory.  5.    Use medication organizer with sections for each day or morning/evening pills.  You may need help loading it  6.    Keep a basket, or pegboard by the door.  Place items that you need to take out with you in the basket or on the pegboard.  You may also want to  include a message board for reminders.  7.    Use sticky notes.  Place sticky notes with reminders in a place where the task is performed.  For example: " turn off the  stove" placed by the stove, "lock the door" placed on the door at eye level, " take your medications" on  the bathroom mirror or by the place where you normally take your medications.  8.    Use alarms/timers.  Use while cooking to remind yourself to check on food or as a reminder to take your medicine, or as a  reminder  to make a call, or as a reminder to perform another task, etc.

## 2018-11-06 ENCOUNTER — Ambulatory Visit: Payer: Medicare Other | Admitting: Gastroenterology

## 2018-11-07 ENCOUNTER — Ambulatory Visit: Payer: Medicare Other | Admitting: Gastroenterology

## 2018-11-08 ENCOUNTER — Encounter: Payer: Self-pay | Admitting: Gastroenterology

## 2018-11-08 ENCOUNTER — Ambulatory Visit (INDEPENDENT_AMBULATORY_CARE_PROVIDER_SITE_OTHER): Payer: Medicare Other | Admitting: Gastroenterology

## 2018-11-08 VITALS — BP 148/92 | HR 59 | Temp 97.3°F | Ht 68.0 in | Wt 193.2 lb

## 2018-11-08 DIAGNOSIS — K259 Gastric ulcer, unspecified as acute or chronic, without hemorrhage or perforation: Secondary | ICD-10-CM

## 2018-11-08 NOTE — Progress Notes (Signed)
Primary Care Physician: Samuella Bruin  Primary Gastroenterologist:  Roetta Sessions, MD   Chief Complaint  Patient presents with  . Follow-up    no complaints    HPI: Thomas [name] is a 68 y.o. male here for follow-up.  He was seen back in July 2019 for follow-up of hospitalization when he presented with abdominal pain.  CT showed small bowel obstruction with transition point in the right lower quadrant.  Wall thickening of the pylorus also noted.  Stable 9 mm hypodensity noted in the hepatic dome.  At the time he was on Mobic.  EGD was performed, showed erosive reflux esophagitis, NG tube trauma, markedly abnormal antral/prepyloric mucosa with glandular/adenomatous change well demarcated circumferentially going into pyloric channel.  Centrally located 1.5 cm deep ulcer.  Pyloric channel mildly stenosed.  Biopsy benign.  No H. Pylori.  January 05, 2018 he underwent exploratory laparotomy, lysis of adhesions, small bowel resection with anastomosis.  Surveillance EGD April 07, 2018 showed distal esophageal erosions within 5 mm of the GE junction.  Small hiatal hernia.  Erythematous/edematous antral mucosal folds.  Previously noted ulcer healed.  Couple of areas of erosions in this area.  Patient advised to take omeprazole 40 mg daily, avoid NSAIDs beyond aspirin 81 mg daily.  Patient is on omeprazole 40 mg daily as needed.  He denies any heartburn or abdominal pain.  No dysphagia.  He presents with his nephew today.  Weight is up over 10 pounds since we last saw him.  No constipation, diarrhea, melena, rectal bleeding.  Due for colonoscopy in 2022.   Current Outpatient Medications  Medication Sig Dispense Refill  . amLODipine (NORVASC) 10 MG tablet TAKE 1 TABLET BY MOUTH EVERY DAY 90 tablet 1  . donepezil (ARICEPT) 10 MG tablet Take 1 tablet (10 mg total) by mouth at bedtime. 30 tablet 3  . meclizine (ANTIVERT) 25 MG tablet Take 1 tablet (25 mg total) by mouth 3 (three) times  daily as needed for dizziness. 30 tablet 0  . methocarbamol (ROBAXIN) 500 MG tablet Take 1 tablet (500 mg total) by mouth 2 (two) times daily. For back spasm pain. 21 tablet 0  . metoprolol tartrate (LOPRESSOR) 25 MG tablet Take 1 tablet by mouth 2 (two) times daily.  11  . mirtazapine (REMERON) 7.5 MG tablet Take 7.5 mg at bedtime by mouth.  11  . omeprazole (PRILOSEC) 40 MG capsule TAKE 1 CAPSULE BY MOUTH DAILY (Patient taking differently: Take by mouth daily. As needed) 30 capsule 2  . ondansetron (ZOFRAN) 4 MG tablet Take 1 tablet (4 mg total) by mouth every 6 (six) hours as needed for nausea. 20 tablet 0  . Potassium Chloride ER 20 MEQ TBCR Take 20 mEq by mouth daily.  1   No current facility-administered medications for this visit.     Allergies as of 11/08/2018  . (No Known Allergies)    ROS:  General: Negative for anorexia, weight loss, fever, chills, fatigue, weakness. ENT: Negative for hoarseness, difficulty swallowing , nasal congestion. CV: Negative for chest pain, angina, palpitations, dyspnea on exertion, peripheral edema.  Respiratory: Negative for dyspnea at rest, dyspnea on exertion, cough, sputum, wheezing.  GI: See history of present illness. GU:  Negative for dysuria, hematuria, urinary incontinence, urinary frequency, nocturnal urination.  Endo: Negative for unusual weight change.    Physical Examination:   BP (!) 148/92   Pulse (!) 59   Temp (!) 97.3 F (36.3 C) (Oral)  Ht 5\' 8"  (1.727 m)   Wt 193 lb 3.2 oz (87.6 kg)   BMI 29.38 kg/m   General: Well-nourished, well-developed in no acute distress.  Eyes: No icterus. Mouth: Oropharyngeal mucosa moist and pink , no lesions erythema or exudate. Lungs: Clear to auscultation bilaterally.  Heart: Regular rate and rhythm, no murmurs rubs or gallops.  Abdomen: Bowel sounds are normal, nontender, nondistended, no hepatosplenomegaly or masses, no abdominal bruits or hernia , no rebound or guarding.   Extremities:  No lower extremity edema. No clubbing or deformities. Neuro: Alert and oriented x 4   Skin: Warm and dry, no jaundice.   Psych: Alert and cooperative, normal mood and affect.  Labs:  Lab Results  Component Value Date   ALT 24 01/01/2018   AST 31 01/01/2018   ALKPHOS 84 01/01/2018   BILITOT 0.3 01/01/2018   Lab Results  Component Value Date   WBC 5.9 05/05/2018   HGB 13.7 05/05/2018   HCT 40.6 05/05/2018   MCV 89.8 05/05/2018   PLT 249 05/05/2018   Lab Results  Component Value Date   CREATININE 1.11 05/05/2018   BUN 10 05/05/2018   NA 138 05/05/2018   K 3.4 (L) 05/05/2018   CL 109 05/05/2018   CO2 22 05/05/2018    Imaging Studies: No results found.

## 2018-11-08 NOTE — Assessment & Plan Note (Addendum)
Doing well. EGD 03/2018 showed healing of gastric ulcer. No longer on NSAIDS. Taking PPI intermittently. Advised to continue PPI daily. Will have him follow up in six months. Call sooner if any problems like abdominal pain, poor appetite, weight loss, n/v, melena.   Routine colonoscopy due in 2022.

## 2018-11-08 NOTE — Patient Instructions (Addendum)
1. Continue omeprazole 40 mg daily to provide stomach protection given your history of ulcers. 2. Return to the office in 6 months.  Call sooner if you have any issues with abdominal pain, poor appetite, nausea and/or vomiting, black stools, bloody stools, weight loss.

## 2018-11-09 NOTE — Progress Notes (Signed)
cc'ed to pcp °

## 2018-12-22 ENCOUNTER — Other Ambulatory Visit: Payer: Self-pay | Admitting: Nurse Practitioner

## 2018-12-29 ENCOUNTER — Other Ambulatory Visit: Payer: Self-pay | Admitting: Student

## 2019-01-01 DIAGNOSIS — Z1389 Encounter for screening for other disorder: Secondary | ICD-10-CM | POA: Diagnosis not present

## 2019-01-01 DIAGNOSIS — S39012A Strain of muscle, fascia and tendon of lower back, initial encounter: Secondary | ICD-10-CM | POA: Diagnosis not present

## 2019-01-01 DIAGNOSIS — Z6830 Body mass index (BMI) 30.0-30.9, adult: Secondary | ICD-10-CM | POA: Diagnosis not present

## 2019-01-01 DIAGNOSIS — M545 Low back pain: Secondary | ICD-10-CM | POA: Diagnosis not present

## 2019-01-01 DIAGNOSIS — E6609 Other obesity due to excess calories: Secondary | ICD-10-CM | POA: Diagnosis not present

## 2019-01-11 DIAGNOSIS — K046 Periapical abscess with sinus: Secondary | ICD-10-CM | POA: Diagnosis not present

## 2019-01-11 DIAGNOSIS — E663 Overweight: Secondary | ICD-10-CM | POA: Diagnosis not present

## 2019-01-11 DIAGNOSIS — Z6829 Body mass index (BMI) 29.0-29.9, adult: Secondary | ICD-10-CM | POA: Diagnosis not present

## 2019-01-29 ENCOUNTER — Telehealth: Payer: Self-pay

## 2019-01-29 NOTE — Telephone Encounter (Signed)
Left vm for pts nephew [name] that pt visit will be video due to COVID 19.I stated if he has a cell phone with a camera we can do visit that way. We need verbal consent to do video and to file insurance. I stated we are not doing in office visits.

## 2019-01-30 NOTE — Telephone Encounter (Addendum)
I call nephew to get verbal consent for visit tomorrow and to file insurance. [name] gave verbal consent for both. He gave new email, and it was sent today. [name] stated he receive the email and the link. I explain to log on 10 minutes prior to visit and click link and type in pts first and last name and click video. I stated to visit is at 0945am.HE verbalized understanding.

## 2019-01-31 ENCOUNTER — Ambulatory Visit (INDEPENDENT_AMBULATORY_CARE_PROVIDER_SITE_OTHER): Payer: Medicare Other | Admitting: Adult Health

## 2019-01-31 ENCOUNTER — Encounter: Payer: Self-pay | Admitting: Adult Health

## 2019-01-31 ENCOUNTER — Other Ambulatory Visit: Payer: Self-pay

## 2019-01-31 DIAGNOSIS — F0390 Unspecified dementia without behavioral disturbance: Secondary | ICD-10-CM

## 2019-01-31 MED ORDER — DONEPEZIL HCL 10 MG PO TABS: 10.0000 mg | ORAL_TABLET | Freq: Every day | ORAL | 3 refills | Status: DC
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
## 2019-02-02 NOTE — Progress Notes (Signed)
I agree with the above plan 

## 2019-02-22 DIAGNOSIS — N400 Enlarged prostate without lower urinary tract symptoms: Secondary | ICD-10-CM | POA: Diagnosis not present

## 2019-02-22 DIAGNOSIS — R972 Elevated prostate specific antigen [PSA]: Secondary | ICD-10-CM | POA: Diagnosis not present

## 2019-02-22 DIAGNOSIS — R310 Gross hematuria: Secondary | ICD-10-CM | POA: Diagnosis not present

## 2019-02-26 ENCOUNTER — Encounter (HOSPITAL_COMMUNITY): Payer: Self-pay | Admitting: Emergency Medicine

## 2019-02-26 ENCOUNTER — Other Ambulatory Visit: Payer: Self-pay

## 2019-02-26 ENCOUNTER — Emergency Department (HOSPITAL_COMMUNITY)
Admission: EM | Admit: 2019-02-26 | Discharge: 2019-02-26 | Disposition: A | Discharge status: Home / Self Care | Payer: Medicare Other | Attending: Emergency Medicine | Admitting: Emergency Medicine

## 2019-02-26 ENCOUNTER — Emergency Department (HOSPITAL_COMMUNITY): Payer: Medicare Other

## 2019-02-26 DIAGNOSIS — I129 Hypertensive chronic kidney disease with stage 1 through stage 4 chronic kidney disease, or unspecified chronic kidney disease: Secondary | ICD-10-CM | POA: Diagnosis not present

## 2019-02-26 DIAGNOSIS — Z79899 Other long term (current) drug therapy: Secondary | ICD-10-CM | POA: Diagnosis not present

## 2019-02-26 DIAGNOSIS — N400 Enlarged prostate without lower urinary tract symptoms: Secondary | ICD-10-CM | POA: Diagnosis not present

## 2019-02-26 DIAGNOSIS — R109 Unspecified abdominal pain: Secondary | ICD-10-CM | POA: Diagnosis present

## 2019-02-26 DIAGNOSIS — K760 Fatty (change of) liver, not elsewhere classified: Secondary | ICD-10-CM | POA: Diagnosis not present

## 2019-02-26 DIAGNOSIS — K5900 Constipation, unspecified: Secondary | ICD-10-CM

## 2019-02-26 DIAGNOSIS — N182 Chronic kidney disease, stage 2 (mild): Secondary | ICD-10-CM | POA: Diagnosis not present

## 2019-02-26 LAB — CBC WITH DIFFERENTIAL/PLATELET
Abs Immature Granulocytes: 0.03 10*3/uL (ref 0.00–0.07)
Basophils Absolute: 0 10*3/uL (ref 0.0–0.1)
Basophils Relative: 1 %
Eosinophils Absolute: 0.1 10*3/uL (ref 0.0–0.5)
Eosinophils Relative: 2 %
HCT: 39.8 % (ref 39.0–52.0)
Hemoglobin: 13.2 g/dL (ref 13.0–17.0)
Immature Granulocytes: 1 %
Lymphocytes Relative: 26 %
Lymphs Abs: 1.7 10*3/uL (ref 0.7–4.0)
MCH: 30.5 pg (ref 26.0–34.0)
MCHC: 33.2 g/dL (ref 30.0–36.0)
MCV: 91.9 fL (ref 80.0–100.0)
Monocytes Absolute: 0.5 10*3/uL (ref 0.1–1.0)
Monocytes Relative: 7 %
Neutro Abs: 4.2 10*3/uL (ref 1.7–7.7)
Neutrophils Relative %: 63 %
Platelets: 306 10*3/uL (ref 150–400)
RBC: 4.33 MIL/uL (ref 4.22–5.81)
RDW: 13.8 % (ref 11.5–15.5)
WBC: 6.5 10*3/uL (ref 4.0–10.5)
nRBC: 0 % (ref 0.0–0.2)

## 2019-02-26 LAB — URINALYSIS, ROUTINE W REFLEX MICROSCOPIC
Bacteria, UA: NONE SEEN
Bilirubin Urine: NEGATIVE
Glucose, UA: NEGATIVE mg/dL
Ketones, ur: NEGATIVE mg/dL
Leukocytes,Ua: NEGATIVE
Nitrite: NEGATIVE
Protein, ur: 30 mg/dL — AB
Specific Gravity, Urine: 1.019 (ref 1.005–1.030)
pH: 5 (ref 5.0–8.0)

## 2019-02-26 LAB — COMPREHENSIVE METABOLIC PANEL
ALT: 18 U/L (ref 0–44)
AST: 29 U/L (ref 15–41)
Albumin: 4.2 g/dL (ref 3.5–5.0)
Alkaline Phosphatase: 95 U/L (ref 38–126)
Anion gap: 10 (ref 5–15)
BUN: 8 mg/dL (ref 8–23)
CO2: 24 mmol/L (ref 22–32)
Calcium: 9.3 mg/dL (ref 8.9–10.3)
Chloride: 106 mmol/L (ref 98–111)
Creatinine, Ser: 0.94 mg/dL (ref 0.61–1.24)
GFR calc Af Amer: >60 mL/min (ref >60)
GFR calc non Af Amer: >60 mL/min (ref >60)
Glucose, Bld: 100 mg/dL — ABNORMAL HIGH (ref 70–99)
Potassium: 3.8 mmol/L (ref 3.5–5.1)
Sodium: 140 mmol/L (ref 135–145)
Total Bilirubin: 0.4 mg/dL (ref 0.3–1.2)
Total Protein: 8.2 g/dL — ABNORMAL HIGH (ref 6.5–8.1)

## 2019-02-26 LAB — LIPASE, BLOOD: Lipase: 30 U/L (ref 11–51)

## 2019-02-26 MED ORDER — POLYETHYLENE GLYCOL 3350 17 G PO PACK: 17.0000 g | PACK | Freq: Every day | ORAL | 0 refills | Status: DC

## 2019-02-26 MED ORDER — IOHEXOL 300 MG/ML  SOLN
100.0000 mL | Freq: Once | INTRAMUSCULAR | Status: AC | PRN
  Administered 2019-02-26: 100 mL via INTRAVENOUS
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
## 2019-02-26 NOTE — Discharge Instructions (Signed)
Take MiraLAX as directed.  Mix 1 packet with 8 ounces of water and drink daily until he starts having good regular bowel movements.  Once he is having bowel movements, he can stop the medication.  Follow-up with primary care doctor as directed.  Return the emergency department for any abdominal pain, vomiting, blood in stools or any other worsening or concerning symptoms.

## 2019-02-26 NOTE — ED Triage Notes (Signed)
Scheduled to see Dr Gala Romney in August   Caregiver brings today because pt has abd pain with constipation  Pt caregiver educated regarding miralaz, mag citrate, increased fiber, fluids, and metamucil daily   Pt reports "trying to go but nothing comes out"

## 2019-03-02 ENCOUNTER — Emergency Department (HOSPITAL_COMMUNITY)
Admission: EM | Admit: 2019-03-02 | Discharge: 2019-03-02 | Disposition: A | Discharge status: Home / Self Care | Payer: Medicare Other | Attending: Emergency Medicine | Admitting: Emergency Medicine

## 2019-03-02 ENCOUNTER — Emergency Department (HOSPITAL_COMMUNITY): Payer: Medicare Other

## 2019-03-02 ENCOUNTER — Encounter (HOSPITAL_COMMUNITY): Payer: Self-pay | Admitting: *Deleted

## 2019-03-02 ENCOUNTER — Other Ambulatory Visit: Payer: Self-pay

## 2019-03-02 DIAGNOSIS — N182 Chronic kidney disease, stage 2 (mild): Secondary | ICD-10-CM | POA: Diagnosis not present

## 2019-03-02 DIAGNOSIS — K5900 Constipation, unspecified: Secondary | ICD-10-CM | POA: Diagnosis not present

## 2019-03-02 DIAGNOSIS — Z79899 Other long term (current) drug therapy: Secondary | ICD-10-CM | POA: Diagnosis not present

## 2019-03-02 DIAGNOSIS — F0390 Unspecified dementia without behavioral disturbance: Secondary | ICD-10-CM | POA: Insufficient documentation

## 2019-03-02 DIAGNOSIS — I129 Hypertensive chronic kidney disease with stage 1 through stage 4 chronic kidney disease, or unspecified chronic kidney disease: Secondary | ICD-10-CM | POA: Insufficient documentation

## 2019-03-02 DIAGNOSIS — K6389 Other specified diseases of intestine: Secondary | ICD-10-CM | POA: Diagnosis not present

## 2019-03-02 DIAGNOSIS — R109 Unspecified abdominal pain: Secondary | ICD-10-CM | POA: Diagnosis not present

## 2019-03-02 DIAGNOSIS — K760 Fatty (change of) liver, not elsewhere classified: Secondary | ICD-10-CM | POA: Diagnosis not present

## 2019-03-02 DIAGNOSIS — N400 Enlarged prostate without lower urinary tract symptoms: Secondary | ICD-10-CM | POA: Diagnosis not present

## 2019-03-02 LAB — COMPREHENSIVE METABOLIC PANEL
ALT: 18 U/L (ref 0–44)
AST: 32 U/L (ref 15–41)
Albumin: 4.5 g/dL (ref 3.5–5.0)
Alkaline Phosphatase: 110 U/L (ref 38–126)
Anion gap: 13 (ref 5–15)
BUN: 6 mg/dL — ABNORMAL LOW (ref 8–23)
CO2: 24 mmol/L (ref 22–32)
Calcium: 9.8 mg/dL (ref 8.9–10.3)
Chloride: 104 mmol/L (ref 98–111)
Creatinine, Ser: 0.87 mg/dL (ref 0.61–1.24)
GFR calc Af Amer: >60 mL/min (ref >60)
GFR calc non Af Amer: >60 mL/min (ref >60)
Glucose, Bld: 120 mg/dL — ABNORMAL HIGH (ref 70–99)
Potassium: 3.8 mmol/L (ref 3.5–5.1)
Sodium: 141 mmol/L (ref 135–145)
Total Bilirubin: 0.6 mg/dL (ref 0.3–1.2)
Total Protein: 8.9 g/dL — ABNORMAL HIGH (ref 6.5–8.1)

## 2019-03-02 LAB — CBC WITH DIFFERENTIAL/PLATELET
Abs Immature Granulocytes: 0.05 10*3/uL (ref 0.00–0.07)
Basophils Absolute: 0 10*3/uL (ref 0.0–0.1)
Basophils Relative: 1 %
Eosinophils Absolute: 0.1 10*3/uL (ref 0.0–0.5)
Eosinophils Relative: 1 %
HCT: 42.3 % (ref 39.0–52.0)
Hemoglobin: 14 g/dL (ref 13.0–17.0)
Immature Granulocytes: 1 %
Lymphocytes Relative: 21 %
Lymphs Abs: 1.6 10*3/uL (ref 0.7–4.0)
MCH: 30 pg (ref 26.0–34.0)
MCHC: 33.1 g/dL (ref 30.0–36.0)
MCV: 90.6 fL (ref 80.0–100.0)
Monocytes Absolute: 0.5 10*3/uL (ref 0.1–1.0)
Monocytes Relative: 7 %
Neutro Abs: 5.5 10*3/uL (ref 1.7–7.7)
Neutrophils Relative %: 69 %
Platelets: 328 10*3/uL (ref 150–400)
RBC: 4.67 MIL/uL (ref 4.22–5.81)
RDW: 13.7 % (ref 11.5–15.5)
WBC: 7.8 10*3/uL (ref 4.0–10.5)
nRBC: 0 % (ref 0.0–0.2)

## 2019-03-02 LAB — POC OCCULT BLOOD, ED: Fecal Occult Bld: NEGATIVE

## 2019-03-02 MED ORDER — BISACODYL 10 MG RE SUPP
10.0000 mg | Freq: Once | RECTAL | Status: AC
  Administered 2019-03-02: 10 mg via RECTAL
  Filled 2019-03-02: qty 1

## 2019-03-02 MED ORDER — MAGNESIUM CITRATE PO SOLN
1.0000 | Freq: Once | ORAL | Status: AC
  Administered 2019-03-02: 1 via ORAL
  Filled 2019-03-02: qty 296
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
## 2019-03-02 NOTE — ED Triage Notes (Signed)
Last BM 3 days ago, c/o abdominal pain intermittent

## 2019-03-02 NOTE — ED Provider Notes (Signed)
   Thomas [name] is a 68 yo male signed out to me by Evalee Jefferson, PA-C pending CT abd/pelvis results.     Thomas [name] comes in with abdominal pain and constipation.  He was also seen here on 02/26/19 for same.  He has hx of SBO with resection and concerned about a possible recurrence.  Taking miralax without improvement.  No fever, vomiting or melena.    Has PCP   Rowan Blase, PA-C GI   Dr. Oneida Alar     Ct Abdomen Pelvis Wo Contrast  Result Date: 03/02/2019 CLINICAL DATA:  Constipation. Last bowel movement 3 days ago. Right mid abdominal intermittent pain since 02/25/2019. EXAM: CT ABDOMEN AND PELVIS WITHOUT CONTRAST TECHNIQUE: Multidetector CT imaging of the abdomen and pelvis was performed following the standard protocol without IV contrast. COMPARISON:  Abdomen radiographs obtained today. Prior CT, 02/26/2019. FINDINGS: Lower chest: No acute abnormality.  Stable pericardial cyst. Hepatobiliary: Decreased attenuation of the liver consistent with fatty infiltration. No liver mass or focal lesion. Normal gallbladder. No bile duct dilation. Pancreas: Unremarkable. No pancreatic ductal dilatation or surrounding inflammatory changes. Spleen: Normal in size without focal abnormality. Adrenals/Urinary Tract: Adrenal glands are unremarkable. Kidneys are normal, without renal calculi, focal lesion, or hydronephrosis. Bladder is unremarkable. Stomach/Bowel: Normal stomach. Small bowel is normal in caliber. No wall thickening or inflammation. Bowel anastomosis staple line noted in the right mid abdomen with another in the right lower quadrant. Colon is normal in caliber. No colonic wall thickening or adjacent inflammation. Normal appendix visualized. There is no increased stool in the colon. Vascular/Lymphatic: No significant vascular findings are present. No enlarged abdominal or pelvic lymph nodes. Reproductive: Significant prostate enlargement. Prostate elevates the bladder. It measures 6.7 x 5.7 x 6.1 cm. Other: No  abdominal wall hernia or abnormality. No abdominopelvic ascites. Musculoskeletal: No fracture or acute finding. No osteoblastic or osteolytic lesions. IMPRESSION: 1. No acute findings. 2. Stable surgical changes from a small-bowel resection and right mid and lower abdomen anastomoses. No evidence of bowel obstruction or inflammation. 3. No increased colonic stool. 4. Hepatic steatosis. 5. Marked prostate hypertrophy. Electronically Signed   By: Lajean Manes M.D.   On: 03/02/2019 17:31     Upon entering the room, pt reports having a large BM just before going for his CT, denies abdominal pain at this point and states that he is feeling better.     I have discussed CT abd/pelvis results with the patient.  He is aware of the enlarged prostate and state he has been evaluated by urology for that.  He appears appropriate for dc home.  Agrees to continue miralax, advised to take BID if needed and close out pt f/u if needed     Kem Parkinson, PA-C 03/02/19 [place], [place], DO 03/07/19 1454

## 2019-03-02 NOTE — Discharge Instructions (Addendum)
Drink plenty of fluids.  You may take the Miralax once or twice a day as needed for occasional constipation.  Follow-up with your primary or GI doctor if needed.

## 2019-03-02 NOTE — ED Notes (Signed)
Pt states going to bathroom.  Was unable to have a full bowel movement.  States " I went but only a little came out"

## 2019-03-06 DIAGNOSIS — E6609 Other obesity due to excess calories: Secondary | ICD-10-CM | POA: Diagnosis not present

## 2019-03-06 DIAGNOSIS — Z6830 Body mass index (BMI) 30.0-30.9, adult: Secondary | ICD-10-CM | POA: Diagnosis not present

## 2019-03-06 DIAGNOSIS — K5900 Constipation, unspecified: Secondary | ICD-10-CM | POA: Diagnosis not present

## 2019-03-27 DIAGNOSIS — E782 Mixed hyperlipidemia: Secondary | ICD-10-CM | POA: Diagnosis not present

## 2019-03-27 DIAGNOSIS — Z6829 Body mass index (BMI) 29.0-29.9, adult: Secondary | ICD-10-CM | POA: Diagnosis not present

## 2019-03-27 DIAGNOSIS — Z0000 Encounter for general adult medical examination without abnormal findings: Secondary | ICD-10-CM | POA: Diagnosis not present

## 2019-03-27 DIAGNOSIS — Z1389 Encounter for screening for other disorder: Secondary | ICD-10-CM | POA: Diagnosis not present

## 2019-03-27 DIAGNOSIS — E663 Overweight: Secondary | ICD-10-CM | POA: Diagnosis not present

## 2019-03-28 ENCOUNTER — Telehealth: Payer: Self-pay | Admitting: Cardiology

## 2019-03-28 NOTE — Telephone Encounter (Signed)
Virtual Visit Pre-Appointment Phone Call  "(Name), I am calling you today to discuss your upcoming appointment. We are currently trying to limit exposure to the virus that causes COVID-19 by seeing patients at home rather than in the office."  1. "What is the BEST phone number to call the day of the visit?" - include this in appointment notes  2. Do you have or have access to (through a family member/friend) a smartphone with video capability that we can use for your visit?" a. If yes - list this number in appt notes as cell (if different from BEST phone #) and list the appointment type as a VIDEO visit in appointment notes b. If no - list the appointment type as a PHONE visit in appointment notes  3. Confirm consent - "In the setting of the current Covid19 crisis, you are scheduled for a (phone or video) visit with your provider on (date) at (time).  Just as we do with many in-office visits, in order for you to participate in this visit, we must obtain consent.  If you'd like, I can send this to your mychart (if signed up) or email for you to review.  Otherwise, I can obtain your verbal consent now.  All virtual visits are billed to your insurance company just like a normal visit would be.  By agreeing to a virtual visit, we'd like you to understand that the technology does not allow for your provider to perform an examination, and thus may limit your provider's ability to fully assess your condition. If your provider identifies any concerns that need to be evaluated in person, we will make arrangements to do so.  Finally, though the technology is pretty good, we cannot assure that it will always work on either your or our end, and in the setting of a video visit, we may have to convert it to a phone-only visit.  In either situation, we cannot ensure that we have a secure connection.  Are you willing to proceed?" STAFF: Did the patient verbally acknowledge consent to telehealth visit? Document  YES/NO here: Yes  4. Advise patient to be prepared - "Two hours prior to your appointment, go ahead and check your blood pressure, pulse, oxygen saturation, and your weight (if you have the equipment to check those) and write them all down. When your visit starts, your provider will ask you for this information. If you have an Apple Watch or Kardia device, please plan to have heart rate information ready on the day of your appointment. Please have a pen and paper handy nearby the day of the visit as well."  5. Give patient instructions for MyChart download to smartphone OR [email or web address] as below if video visit (depending on what platform provider is using)  6. Inform patient they will receive a phone call 15 minutes prior to their appointment time (may be from unknown caller ID) so they should be prepared to answer    TELEPHONE CALL NOTE  Thomas [name] has been deemed a candidate for a follow-up tele-health visit to limit community exposure during the Covid-19 pandemic. I spoke with the patient via phone to ensure availability of phone/video source, confirm preferred email & phone number, and discuss instructions and expectations.  I reminded Thomas [name] to be prepared with any vital sign and/or heart rhythm information that could potentially be obtained via home monitoring, at the time of his visit. I reminded Thomas [name] to expect a phone call prior to  his visit.  [name] 03/28/2019 1:00 PM

## 2019-03-31 ENCOUNTER — Other Ambulatory Visit: Payer: Self-pay | Admitting: Nurse Practitioner

## 2019-04-03 ENCOUNTER — Encounter: Payer: Self-pay | Admitting: Cardiology

## 2019-04-03 ENCOUNTER — Telehealth (INDEPENDENT_AMBULATORY_CARE_PROVIDER_SITE_OTHER): Payer: Medicare Other | Admitting: Cardiology

## 2019-04-03 VITALS — Ht 67.0 in | Wt 194.0 lb

## 2019-04-03 DIAGNOSIS — R0789 Other chest pain: Secondary | ICD-10-CM

## 2019-04-03 DIAGNOSIS — I10 Essential (primary) hypertension: Secondary | ICD-10-CM

## 2019-04-03 DIAGNOSIS — R002 Palpitations: Secondary | ICD-10-CM

## 2019-04-03 NOTE — Patient Instructions (Signed)
Medication Instructions: Your physician recommends that you continue on your current medications as directed. Please refer to the Current Medication list given to you today.   Labwork: None  Procedures/Testing: None  Follow-Up: 6 months with Dr.Branch  Any Additional Special Instructions Will Be Listed Below (If Applicable).     If you need a refill on your cardiac medications before your next appointment, please call your pharmacy.     Thank you for choosing [facility] !        
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
## 2019-04-20 ENCOUNTER — Other Ambulatory Visit: Payer: Self-pay

## 2019-04-20 ENCOUNTER — Encounter

## 2019-04-20 ENCOUNTER — Ambulatory Visit (INDEPENDENT_AMBULATORY_CARE_PROVIDER_SITE_OTHER): Payer: Medicare Other | Admitting: Gastroenterology

## 2019-04-20 ENCOUNTER — Encounter: Payer: Self-pay | Admitting: Gastroenterology

## 2019-04-20 VITALS — BP 149/88 | HR 61 | Temp 96.9°F | Ht 67.0 in | Wt 196.8 lb

## 2019-04-20 DIAGNOSIS — K5900 Constipation, unspecified: Secondary | ICD-10-CM | POA: Diagnosis not present

## 2019-04-20 DIAGNOSIS — K259 Gastric ulcer, unspecified as acute or chronic, without hemorrhage or perforation: Secondary | ICD-10-CM | POA: Diagnosis not present

## 2019-04-20 NOTE — Patient Instructions (Addendum)
1. Continue omeprazole once daily.  2. Return to the office in one year.  3. We will plan on your next colonoscopy in 2022.

## 2019-04-20 NOTE — Progress Notes (Signed)
Primary Care Physician: Ginger Organ  Primary Gastroenterologist:  Garfield Cornea, MD   Chief Complaint  Patient presents with  . gastric ulcer    f/u, doing ok    HPI: Thomas [name] is a 68 y.o. male here for follow-up.  Last seen February 2020.  CT showed small bowel obstruction with transition point in the right lower quadrant.  Wall thickening of the pylorus also noted.  Stable 9 mm hypodensity noted in the hepatic dome.  At the time he was on Mobic.  EGD was performed, showed erosive reflux esophagitis, NG tube trauma, markedly abnormal antral/prepyloric mucosa with glandular/adenomatous change well demarcated circumferentially going into pyloric channel.  Centrally located 1.5 cm deep ulcer.  Pyloric channel mildly stenosed.  Biopsy benign.  No H. Pylori.  April 2019 he underwent exploratory laparotomy, lysis of adhesions, small bowel resection with anastomosis.  Surveillance EGD April 07, 2018 showed distal esophageal erosions within 5 mm of the GE junction.  Small hiatal hernia.  Erythematous/edematous antral mucosal folds.  Previously noted ulcer had healed.  Couple of areas of erosions in the area.  Patient advised to continue PPI, avoid NSAIDs beyond aspirin 81 mg daily.  He is due for colonoscopy in 2022.  Patient was seen in the ED back in June for constipation.  CT abdomen pelvis without contrast showed fatty liver.  Stable surgical changes from small bowel resection and right mid and lower abdominal anastomosis.  No bowel obstruction.  CT abdomen pelvis with contrast February 26, 2019 for abdominal pain showed surgical changes related to prior small bowel surgery.  No obstruction.  Moderate fatty liver.  Markedly enlarged prostate gland with prominent seminal vesicles.  Doing well. BMs improved and regular. No melena, brbpr. No abd pain. No heartburn, dysphagia. Appetite is good. No weight loss.    Current Outpatient Medications  Medication Sig Dispense Refill   . acetaminophen (TYLENOL) 500 MG tablet Take 500 mg by mouth every 6 (six) hours as needed for mild pain or moderate pain.    Marland Kitchen amLODipine (NORVASC) 10 MG tablet TAKE 1 TABLET BY MOUTH EVERY DAY (Patient taking differently: Take 10 mg by mouth daily. ) 90 tablet 2  . donepezil (ARICEPT) 10 MG tablet Take 1 tablet (10 mg total) by mouth at bedtime. 90 tablet 3  . metoprolol tartrate (LOPRESSOR) 25 MG tablet Take 1 tablet by mouth 2 (two) times daily.  11  . mirtazapine (REMERON) 7.5 MG tablet Take 7.5 mg at bedtime by mouth.  11  . omeprazole (PRILOSEC) 40 MG capsule TAKE 1 CAPSULE BY MOUTH DAILY 30 capsule 5  . Potassium Chloride ER 20 MEQ TBCR Take 20 mEq by mouth daily.   1   No current facility-administered medications for this visit.     Allergies as of 04/20/2019  . (No Known Allergies)    ROS:  General: Negative for anorexia, weight loss, fever, chills, fatigue, weakness. ENT: Negative for hoarseness, difficulty swallowing , nasal congestion. CV: Negative for chest pain, angina, palpitations, dyspnea on exertion, peripheral edema.  Respiratory: Negative for dyspnea at rest, dyspnea on exertion, cough, sputum, wheezing.  GI: See history of present illness. GU:  Negative for dysuria, hematuria, urinary incontinence, urinary frequency, nocturnal urination.  Endo: Negative for unusual weight change.    Physical Examination:   BP (!) 149/88   Pulse 61   Temp (!) 96.9 F (36.1 C) (Temporal)   Ht 5\' 7"  (1.702 m)   Wt 196  lb 12.8 oz (89.3 kg)   BMI 30.82 kg/m   General: Well-nourished, well-developed in no acute distress.  Eyes: No icterus. Mouth: Oropharyngeal mucosa moist and pink , no lesions erythema or exudate. Lungs: Clear to auscultation bilaterally.  Heart: Regular rate and rhythm, no murmurs rubs or gallops.  Abdomen: Bowel sounds are normal, nontender, nondistended, no hepatosplenomegaly or masses, no abdominal bruits or hernia , no rebound or guarding.    Extremities: No lower extremity edema. No clubbing or deformities. Neuro: Alert and oriented x 4   Skin: Warm and dry, no jaundice.   Psych: Alert and cooperative, normal mood and affect.  Labs:  Lab Results  Component Value Date   WBC 7.8 03/02/2019   HGB 14.0 03/02/2019   HCT 42.3 03/02/2019   MCV 90.6 03/02/2019   PLT 328 03/02/2019   Lab Results  Component Value Date   CREATININE 0.87 03/02/2019   BUN 6 (L) 03/02/2019   NA 141 03/02/2019   K 3.8 03/02/2019   CL 104 03/02/2019   CO2 24 03/02/2019   Lab Results  Component Value Date   ALT 18 03/02/2019   AST 32 03/02/2019   ALKPHOS 110 03/02/2019   BILITOT 0.6 03/02/2019   Lab Results  Component Value Date   LIPASE 30 02/26/2019    Imaging Studies: No results found.

## 2019-04-20 NOTE — Assessment & Plan Note (Signed)
Documented ulcer healing with some residual erythema. Doing well. No longer on NSAIDS. Taking omeprazole daily. Return to the office in one year.

## 2019-04-20 NOTE — Assessment & Plan Note (Signed)
Doing well at this time. Uses miralax prn only.

## 2019-04-26 NOTE — Progress Notes (Signed)
cc'd to pcp 

## 2019-05-09 ENCOUNTER — Ambulatory Visit: Payer: Medicare Other | Admitting: Gastroenterology

## 2019-05-14 ENCOUNTER — Emergency Department (HOSPITAL_COMMUNITY): Payer: Medicare Other

## 2019-05-14 ENCOUNTER — Other Ambulatory Visit: Payer: Self-pay

## 2019-05-14 ENCOUNTER — Emergency Department (HOSPITAL_COMMUNITY)
Admission: EM | Admit: 2019-05-14 | Discharge: 2019-05-14 | Disposition: A | Discharge status: Home / Self Care | Payer: Medicare Other | Attending: Emergency Medicine | Admitting: Emergency Medicine

## 2019-05-14 ENCOUNTER — Encounter (HOSPITAL_COMMUNITY): Payer: Self-pay | Admitting: Emergency Medicine

## 2019-05-14 DIAGNOSIS — Z79899 Other long term (current) drug therapy: Secondary | ICD-10-CM | POA: Diagnosis not present

## 2019-05-14 DIAGNOSIS — I129 Hypertensive chronic kidney disease with stage 1 through stage 4 chronic kidney disease, or unspecified chronic kidney disease: Secondary | ICD-10-CM | POA: Diagnosis not present

## 2019-05-14 DIAGNOSIS — F0390 Unspecified dementia without behavioral disturbance: Secondary | ICD-10-CM | POA: Insufficient documentation

## 2019-05-14 DIAGNOSIS — R079 Chest pain, unspecified: Secondary | ICD-10-CM | POA: Diagnosis not present

## 2019-05-14 DIAGNOSIS — R0789 Other chest pain: Secondary | ICD-10-CM | POA: Insufficient documentation

## 2019-05-14 DIAGNOSIS — N182 Chronic kidney disease, stage 2 (mild): Secondary | ICD-10-CM | POA: Diagnosis not present

## 2019-05-14 LAB — CBC
HCT: 41.7 % (ref 39.0–52.0)
Hemoglobin: 13.6 g/dL (ref 13.0–17.0)
MCH: 30.1 pg (ref 26.0–34.0)
MCHC: 32.6 g/dL (ref 30.0–36.0)
MCV: 92.3 fL (ref 80.0–100.0)
Platelets: 319 10*3/uL (ref 150–400)
RBC: 4.52 MIL/uL (ref 4.22–5.81)
RDW: 13.5 % (ref 11.5–15.5)
WBC: 6.6 10*3/uL (ref 4.0–10.5)
nRBC: 0 % (ref 0.0–0.2)

## 2019-05-14 LAB — BASIC METABOLIC PANEL
Anion gap: 7 (ref 5–15)
BUN: 8 mg/dL (ref 8–23)
CO2: 26 mmol/L (ref 22–32)
Calcium: 9.5 mg/dL (ref 8.9–10.3)
Chloride: 107 mmol/L (ref 98–111)
Creatinine, Ser: 0.96 mg/dL (ref 0.61–1.24)
GFR calc Af Amer: >60 mL/min (ref >60)
GFR calc non Af Amer: >60 mL/min (ref >60)
Glucose, Bld: 119 mg/dL — ABNORMAL HIGH (ref 70–99)
Potassium: 4.1 mmol/L (ref 3.5–5.1)
Sodium: 140 mmol/L (ref 135–145)

## 2019-05-14 LAB — TROPONIN I (HIGH SENSITIVITY)
Troponin I (High Sensitivity): 3 ng/L (ref <18)
Troponin I (High Sensitivity): 3 ng/L (ref <18)

## 2019-05-14 NOTE — Discharge Instructions (Addendum)
As discussed, your xray, your ekg and chest xray are stable today.  Plan a followup with your primary doctor as discussed.

## 2019-05-14 NOTE — ED Triage Notes (Signed)
Pt states he was sitting on the porch this morning talking to his brother and has been having intermittent chest pain since. States it does not last long and does not have any other symptoms with it.
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
## 2019-06-07 DIAGNOSIS — R0789 Other chest pain: Secondary | ICD-10-CM | POA: Diagnosis not present

## 2019-06-07 DIAGNOSIS — Z6830 Body mass index (BMI) 30.0-30.9, adult: Secondary | ICD-10-CM | POA: Diagnosis not present

## 2019-06-07 DIAGNOSIS — E6609 Other obesity due to excess calories: Secondary | ICD-10-CM | POA: Diagnosis not present

## 2019-06-19 DIAGNOSIS — Z23 Encounter for immunization: Secondary | ICD-10-CM | POA: Diagnosis not present

## 2019-07-23 DIAGNOSIS — H5213 Myopia, bilateral: Secondary | ICD-10-CM | POA: Diagnosis not present

## 2019-07-23 DIAGNOSIS — H524 Presbyopia: Secondary | ICD-10-CM | POA: Diagnosis not present

## 2019-07-23 DIAGNOSIS — H40013 Open angle with borderline findings, low risk, bilateral: Secondary | ICD-10-CM | POA: Diagnosis not present

## 2019-07-23 DIAGNOSIS — H52223 Regular astigmatism, bilateral: Secondary | ICD-10-CM | POA: Diagnosis not present

## 2019-07-23 DIAGNOSIS — H2513 Age-related nuclear cataract, bilateral: Secondary | ICD-10-CM | POA: Diagnosis not present

## 2019-07-26 IMAGING — CT CT ABD-PELV W/ CM
2 of 5 series · 16 of 46 positions shown, 18 images · IV contrast (Isovue)
Comparison: None.

CLINICAL DATA: Nausea and vomiting

EXAM:
CT ABDOMEN AND PELVIS WITH CONTRAST
TECHNIQUE: Multidetector CT imaging of the abdomen and pelvis was performed
using the standard protocol following bolus administration of
intravenous contrast.
CONTRAST:  100mL 946GEB-BLL IOPAMIDOL (946GEB-BLL) INJECTION 61%

[Series 3: axial st · axial · 0.77mm/px · z∈[+937,+1352]mm · 13 of 93 slices shown, 15 images]
[im 5/93  soft-tissue]
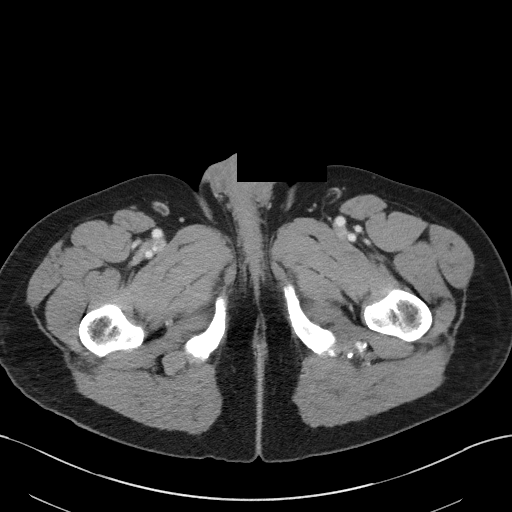
[im 5/93  bone]
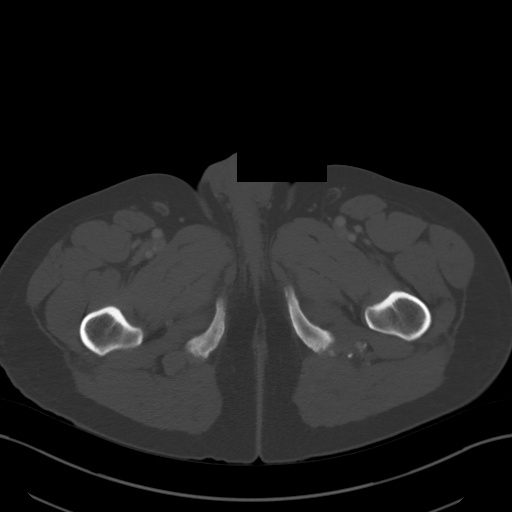
[im 15/93  soft-tissue]
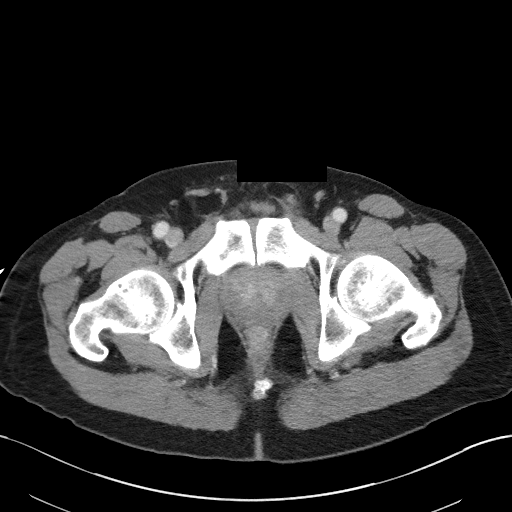
[im 20/93  soft-tissue]
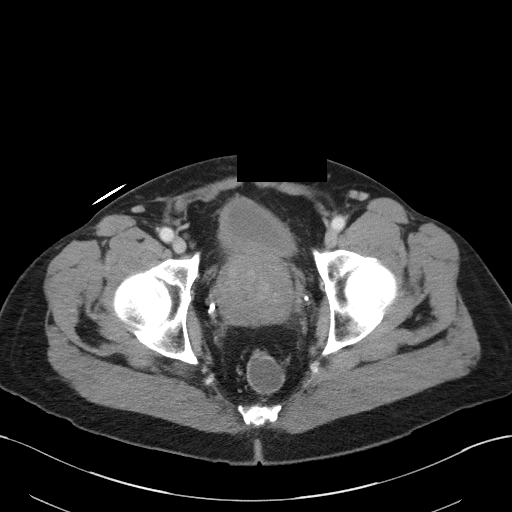
[im 25/93  soft-tissue]
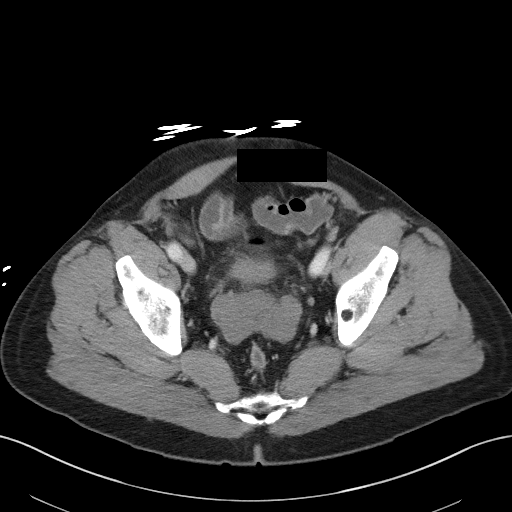
[im 34/93  soft-tissue]
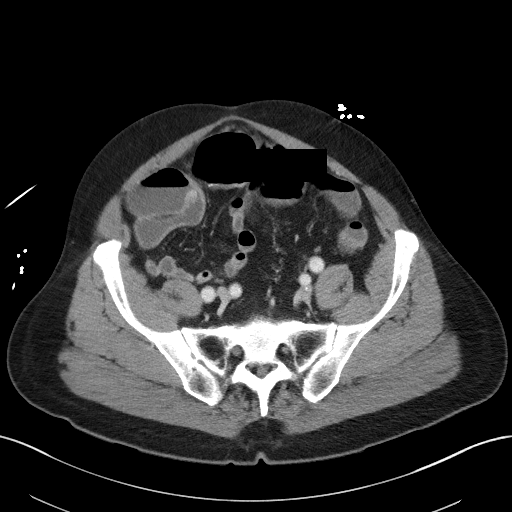
[im 39/93  soft-tissue]
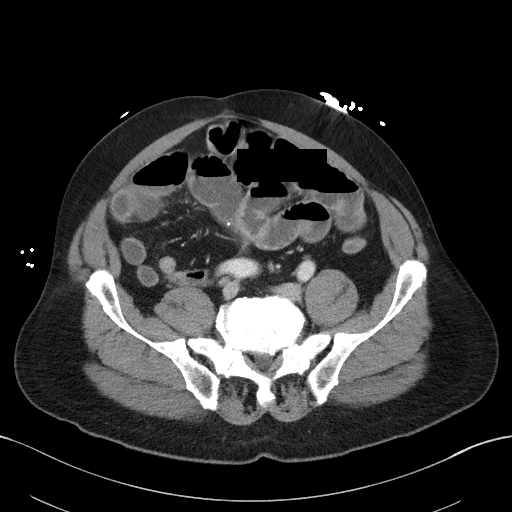
[im 49/93  soft-tissue]
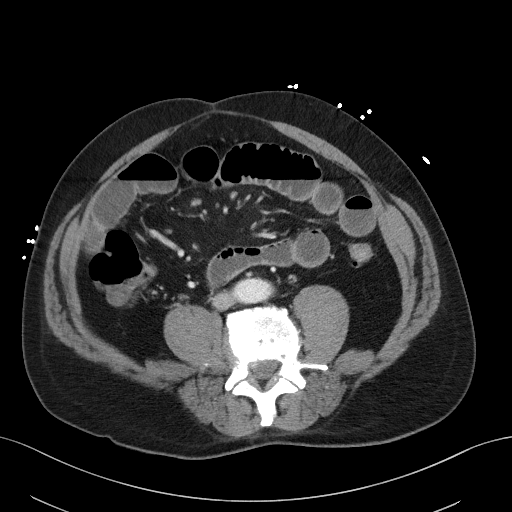
[im 54/93  soft-tissue]
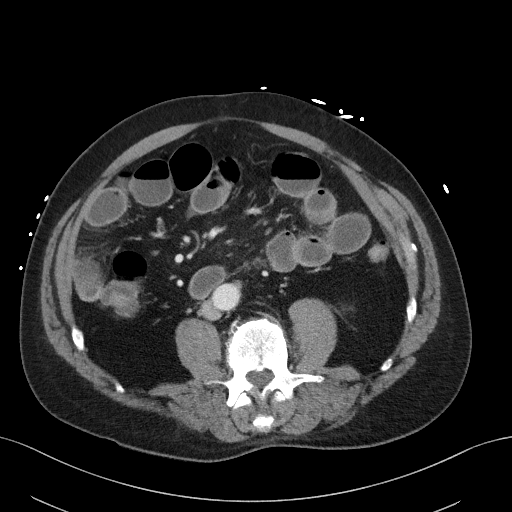
[im 59/93  soft-tissue]
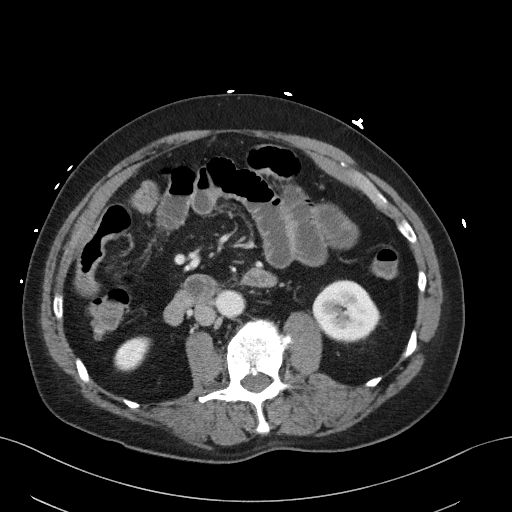
[im 59/93  bone]
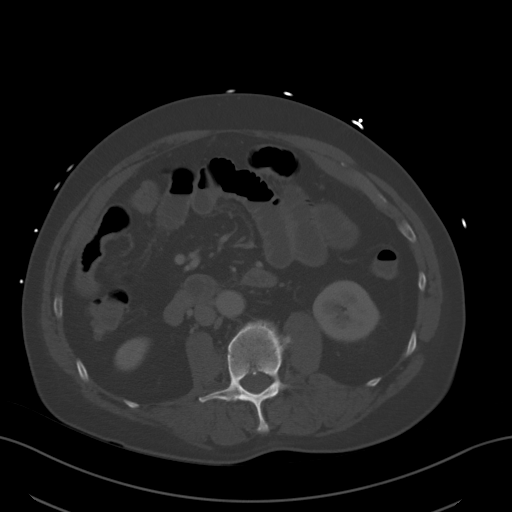
[im 68/93  soft-tissue]
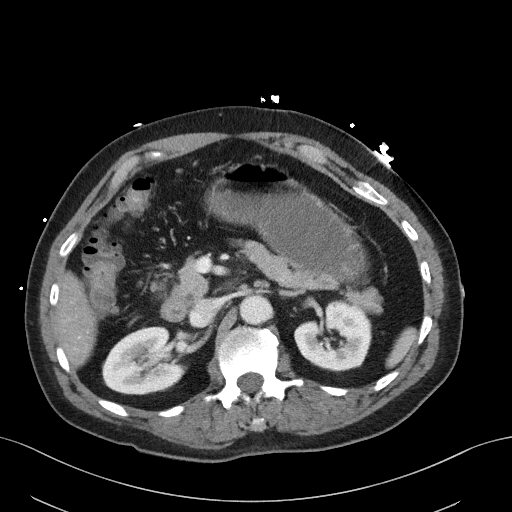
[im 73/93  soft-tissue]
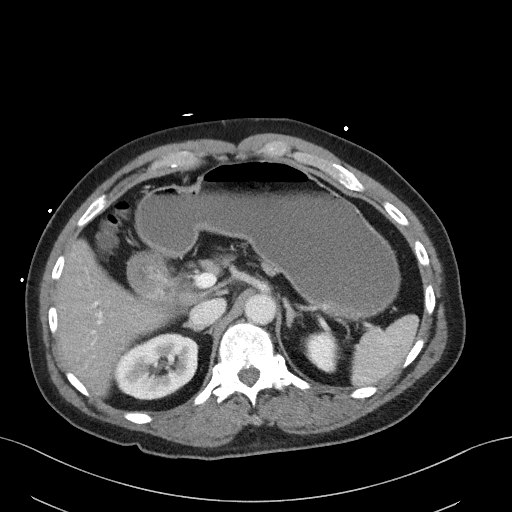
[im 78/93  soft-tissue]
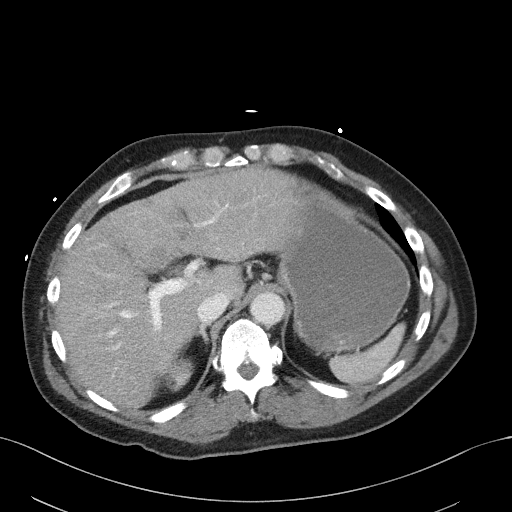
[im 88/93  soft-tissue]
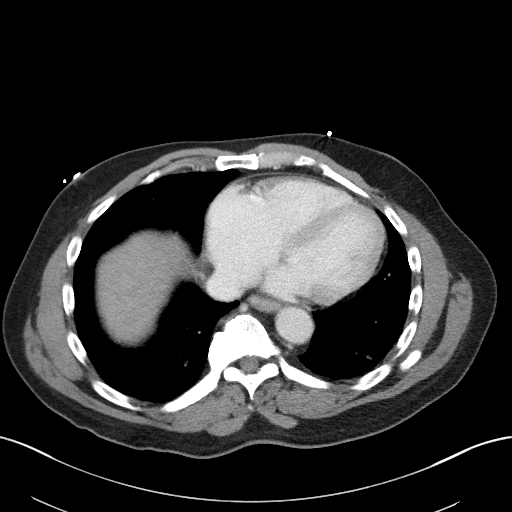

[Series 6: coronal st · coronal · 0.73mm/px · 3 of 95 slices shown]
[im 32/95  soft-tissue]
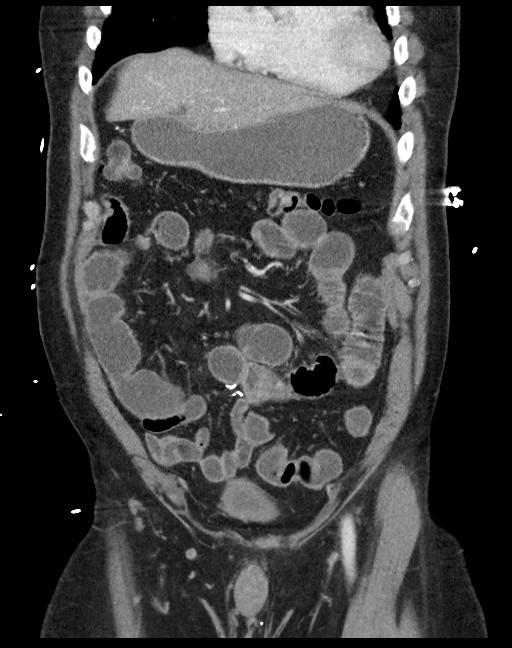
[im 42/95  soft-tissue]
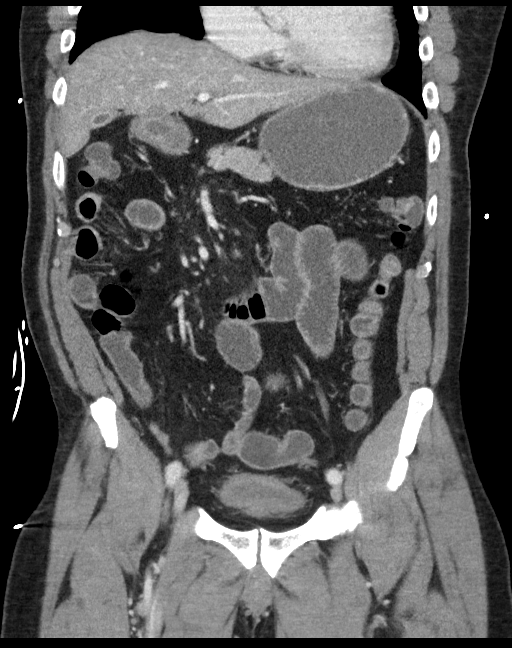
[im 53/95  soft-tissue]
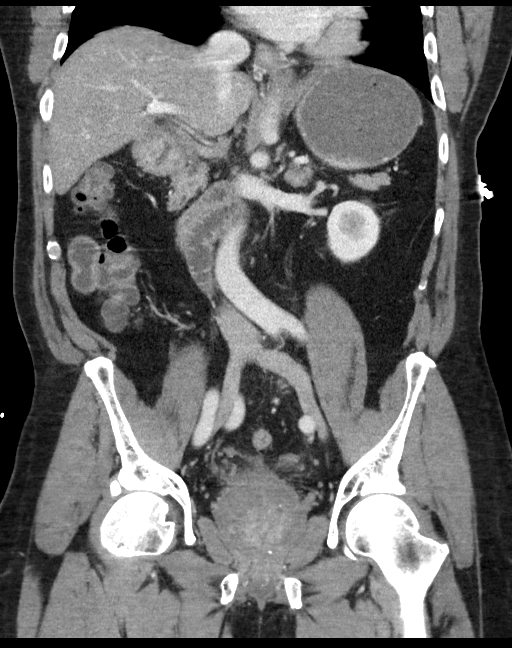

[16 of 46 positions shown; findings below may reference images not displayed]

FINDINGS: Lower chest: No acute abnormality.

Hepatobiliary: No focal liver abnormality is seen. No gallstones,
gallbladder wall thickening, or biliary dilatation.

Pancreas: Unremarkable. No pancreatic ductal dilatation or
surrounding inflammatory changes.

Spleen: Normal in size without focal abnormality.

Adrenals/Urinary Tract: Adrenal glands are unremarkable. Kidneys are
normal, without renal calculi, focal lesion, or hydronephrosis.
Bladder is unremarkable.

Stomach/Bowel: No bowel wall thickening. Multiple air-fluid levels
in the stomach, small bowel and colon as can be seen with
enterocolitis. No pneumatosis, pneumoperitoneum or portal venous
gas.

Vascular/Lymphatic: No significant vascular findings are present. No
enlarged abdominal or pelvic lymph nodes.

Reproductive: Prostate is enlarged.

Other: No abdominal wall hernia or abnormality. No abdominopelvic
ascites.

Musculoskeletal: No acute osseous abnormality. No aggressive osseous
lesion. Degenerative disc disease disc height loss throughout the
lumbar spine.
IMPRESSION: 1. Multiple air-fluid levels in the stomach, small bowel and colon
as can be seen with enterocolitis.

## 2019-08-07 ENCOUNTER — Ambulatory Visit (INDEPENDENT_AMBULATORY_CARE_PROVIDER_SITE_OTHER): Payer: Medicare Other | Admitting: Adult Health

## 2019-08-07 ENCOUNTER — Encounter: Payer: Self-pay | Admitting: Adult Health

## 2019-08-07 ENCOUNTER — Other Ambulatory Visit: Payer: Self-pay

## 2019-08-07 VITALS — BP 126/70 | HR 60 | Temp 97.9°F | Ht 67.0 in | Wt 202.4 lb

## 2019-08-07 DIAGNOSIS — F0390 Unspecified dementia without behavioral disturbance: Secondary | ICD-10-CM

## 2019-08-07 NOTE — Patient Instructions (Signed)
Your Plan:  Continue Aricept 10 mg daily  Highly encourage use of memory exercises such as crossword puzzles, word searches, card games and reading    Follow-up in 1 year or call earlier if needed     Thank you for coming to see [place] at [facility]. I hope we have been able to provide you high quality care today.  You may receive a patient satisfaction survey over the next few weeks. We would appreciate your feedback and comments so that we may continue to improve ourselves and the health of our patients.
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
## 2019-08-07 NOTE — Progress Notes (Signed)
I agree with the above plan 

## 2019-09-18 ENCOUNTER — Other Ambulatory Visit: Payer: Self-pay | Admitting: Student

## 2019-09-18 ENCOUNTER — Other Ambulatory Visit: Payer: Self-pay | Admitting: Nurse Practitioner

## 2019-12-14 ENCOUNTER — Other Ambulatory Visit: Payer: Self-pay | Admitting: Cardiology

## 2019-12-14 ENCOUNTER — Other Ambulatory Visit: Payer: Self-pay | Admitting: Student

## 2020-01-11 ENCOUNTER — Other Ambulatory Visit: Payer: Self-pay

## 2020-01-11 ENCOUNTER — Encounter: Payer: Self-pay | Admitting: Cardiology

## 2020-01-11 ENCOUNTER — Ambulatory Visit (INDEPENDENT_AMBULATORY_CARE_PROVIDER_SITE_OTHER): Payer: Medicare Other | Admitting: Cardiology

## 2020-01-11 VITALS — BP 146/84 | HR 62 | Temp 98.8°F | Ht 68.0 in | Wt 211.0 lb

## 2020-01-11 DIAGNOSIS — R0789 Other chest pain: Secondary | ICD-10-CM

## 2020-01-11 DIAGNOSIS — R002 Palpitations: Secondary | ICD-10-CM

## 2020-01-11 DIAGNOSIS — I10 Essential (primary) hypertension: Secondary | ICD-10-CM | POA: Diagnosis not present

## 2020-01-11 NOTE — Patient Instructions (Signed)
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
## 2020-01-12 DIAGNOSIS — Z23 Encounter for immunization: Secondary | ICD-10-CM | POA: Diagnosis not present

## 2020-02-07 ENCOUNTER — Other Ambulatory Visit: Payer: Self-pay | Admitting: Adult Health

## 2020-02-09 DIAGNOSIS — Z23 Encounter for immunization: Secondary | ICD-10-CM | POA: Diagnosis not present

## 2020-02-13 ENCOUNTER — Encounter: Payer: Self-pay | Admitting: Internal Medicine

## 2020-02-19 ENCOUNTER — Other Ambulatory Visit: Payer: Self-pay | Admitting: Adult Health

## 2020-03-18 ENCOUNTER — Other Ambulatory Visit: Payer: Self-pay | Admitting: Nurse Practitioner

## 2020-04-03 DIAGNOSIS — R972 Elevated prostate specific antigen [PSA]: Secondary | ICD-10-CM | POA: Diagnosis not present

## 2020-04-08 ENCOUNTER — Encounter: Payer: Self-pay | Admitting: Gastroenterology

## 2020-04-08 ENCOUNTER — Ambulatory Visit (INDEPENDENT_AMBULATORY_CARE_PROVIDER_SITE_OTHER): Payer: Medicare Other | Admitting: Gastroenterology

## 2020-04-08 ENCOUNTER — Other Ambulatory Visit: Payer: Self-pay

## 2020-04-08 DIAGNOSIS — K2100 Gastro-esophageal reflux disease with esophagitis, without bleeding: Secondary | ICD-10-CM | POA: Diagnosis not present

## 2020-04-08 DIAGNOSIS — K219 Gastro-esophageal reflux disease without esophagitis: Secondary | ICD-10-CM | POA: Insufficient documentation

## 2020-04-08 NOTE — Progress Notes (Signed)
Primary Care Physician: Samuella Bruin  Primary Gastroenterologist:  Roetta Sessions, MD   Chief Complaint  Patient presents with  . gastric ulcer    doing ok    HPI: Thomas [name] is a 69 y.o. male here for follow-up.  Last seen July 2020.  Patient has a history of constipation, previous gastric ulcer (2019), due for screening colonoscopy in 2022.  Last EGD July 2019 with small hiatal hernia, distal esophageal erosions, erythematous/edematous antral mucosal folds, previous gastric ulcer healed.  Couple of gastric erosions in that area.  Last colonoscopy in June 2012 with pancolonic diverticula.  Clinically doing well.  Denies any abdominal pain or heartburn.  Uses omeprazole only when needed.  Rarely has to take it.  Is on aspirin 81 mg daily but no other aspirin products or NSAIDs.  Denies any constipation or diarrhea.  No melena or rectal bleeding.  No weight loss.  Appetite is good.  He is up a few pounds.     Current Outpatient Medications  Medication Sig Dispense Refill  . acetaminophen (TYLENOL) 500 MG tablet Take 500 mg by mouth every 6 (six) hours as needed for mild pain or moderate pain.    Marland Kitchen amLODipine (NORVASC) 10 MG tablet TAKE 1 TABLET BY MOUTH EVERY DAY 90 tablet 3  . aspirin EC 81 MG tablet Take 81 mg by mouth daily.    Marland Kitchen donepezil (ARICEPT) 10 MG tablet TAKE 1 TABLET(10 MG) BY MOUTH AT BEDTIME 90 tablet 3  . meclizine (ANTIVERT) 25 MG tablet Take 25 mg by mouth every 6 (six) hours as needed for dizziness.    . metoprolol tartrate (LOPRESSOR) 25 MG tablet Take 1 tablet by mouth 2 (two) times daily.  11  . mirtazapine (REMERON) 7.5 MG tablet Take 7.5 mg at bedtime by mouth.  11  . nitroGLYCERIN (NITROSTAT) 0.4 MG SL tablet PLACE 1 TABLET INSIDE THE CHEEK 5-10 MINUTES PRIOR TO ACTIVITIES WHICH MIGHT PRECIPITATE AN ATTACK OR CHEST PRESSURE    . omeprazole (PRILOSEC) 40 MG capsule TAKE 1 CAPSULE BY MOUTH DAILY (Patient taking differently: Take 40 mg by  mouth daily as needed. ) 30 capsule 5  . Potassium Chloride ER 20 MEQ TBCR Take 40 mEq by mouth daily.   1   No current facility-administered medications for this visit.    Allergies as of 04/08/2020  . (No Known Allergies)    ROS:  General: Negative for anorexia, weight loss, fever, chills, fatigue, weakness. ENT: Negative for hoarseness, difficulty swallowing , nasal congestion. CV: Negative for chest pain, angina, palpitations, dyspnea on exertion, peripheral edema.  Respiratory: Negative for dyspnea at rest, dyspnea on exertion, cough, sputum, wheezing.  GI: See history of present illness. GU:  Negative for dysuria, hematuria, urinary incontinence, urinary frequency, nocturnal urination.  Endo: Negative for unusual weight change.    Physical Examination:   BP (!) 150/86   Pulse (!) 57   Temp (!) 97.2 F (36.2 C) (Oral)   Ht 5\' 8"  (1.727 m)   Wt 214 lb (97.1 kg)   BMI 32.54 kg/m   General: Well-nourished, well-developed in no acute distress.  Eyes: No icterus. Mouth:masked Abdomen: Bowel sounds are normal, nontender, nondistended, no hepatosplenomegaly or masses, no abdominal bruits or hernia , no rebound or guarding.   Extremities: No lower extremity edema. No clubbing or deformities. Neuro: Alert and oriented x 4   Skin: Warm and dry, no jaundice.   Psych: Alert and cooperative, normal mood  and affect.   Imaging Studies: No results found.    Impression/plan:  Pleasant 69 year old gentleman with history of prior gastric ulcer with documented healing, reflux esophagitis presenting for follow-up.  He has done well the past 1 year.  Takes omeprazole daily as needed but does not have to take on a regular basis.  He feels like he is doing well.  His nephew [name] helps him confirmed that the patient seems to be doing very well.  He is due for screening colonoscopy next year if health permits.  We will have him come back in 1 year and schedule accordingly.  They will  call in the interim if any problems.

## 2020-04-08 NOTE — Patient Instructions (Addendum)
1. Continue omeprazole once daily AS NEEDED for control of reflux symptoms.  2. You will be due for a colonoscopy next year. We will scheduled at time of your next visit in 03/2021. 3. Call with any questions or concerns.

## 2020-04-10 DIAGNOSIS — R972 Elevated prostate specific antigen [PSA]: Secondary | ICD-10-CM | POA: Diagnosis not present

## 2020-04-10 DIAGNOSIS — N400 Enlarged prostate without lower urinary tract symptoms: Secondary | ICD-10-CM | POA: Diagnosis not present

## 2020-04-30 DIAGNOSIS — G4700 Insomnia, unspecified: Secondary | ICD-10-CM | POA: Diagnosis not present

## 2020-05-08 DIAGNOSIS — H524 Presbyopia: Secondary | ICD-10-CM | POA: Diagnosis not present

## 2020-05-08 DIAGNOSIS — H2513 Age-related nuclear cataract, bilateral: Secondary | ICD-10-CM | POA: Diagnosis not present

## 2020-05-08 DIAGNOSIS — H52223 Regular astigmatism, bilateral: Secondary | ICD-10-CM | POA: Diagnosis not present

## 2020-05-08 DIAGNOSIS — H40013 Open angle with borderline findings, low risk, bilateral: Secondary | ICD-10-CM | POA: Diagnosis not present

## 2020-05-14 DIAGNOSIS — R7309 Other abnormal glucose: Secondary | ICD-10-CM | POA: Diagnosis not present

## 2020-05-14 DIAGNOSIS — G4700 Insomnia, unspecified: Secondary | ICD-10-CM | POA: Diagnosis not present

## 2020-05-14 DIAGNOSIS — Z0000 Encounter for general adult medical examination without abnormal findings: Secondary | ICD-10-CM | POA: Diagnosis not present

## 2020-05-14 DIAGNOSIS — I10 Essential (primary) hypertension: Secondary | ICD-10-CM | POA: Diagnosis not present

## 2020-05-14 DIAGNOSIS — Z0001 Encounter for general adult medical examination with abnormal findings: Secondary | ICD-10-CM | POA: Diagnosis not present

## 2020-05-14 DIAGNOSIS — N400 Enlarged prostate without lower urinary tract symptoms: Secondary | ICD-10-CM | POA: Diagnosis not present

## 2020-05-14 DIAGNOSIS — Z1389 Encounter for screening for other disorder: Secondary | ICD-10-CM | POA: Diagnosis not present

## 2020-05-14 DIAGNOSIS — F0390 Unspecified dementia without behavioral disturbance: Secondary | ICD-10-CM | POA: Diagnosis not present

## 2020-06-21 ENCOUNTER — Encounter (HOSPITAL_COMMUNITY): Payer: Self-pay | Admitting: *Deleted

## 2020-06-21 ENCOUNTER — Other Ambulatory Visit: Payer: Self-pay

## 2020-06-21 ENCOUNTER — Emergency Department (HOSPITAL_COMMUNITY)
Admission: EM | Admit: 2020-06-21 | Discharge: 2020-06-21 | Disposition: A | Discharge status: Home / Self Care | Payer: Medicare Other | Attending: Emergency Medicine | Admitting: Emergency Medicine

## 2020-06-21 ENCOUNTER — Emergency Department (HOSPITAL_COMMUNITY): Payer: Medicare Other

## 2020-06-21 DIAGNOSIS — I10 Essential (primary) hypertension: Secondary | ICD-10-CM | POA: Diagnosis not present

## 2020-06-21 DIAGNOSIS — Z7982 Long term (current) use of aspirin: Secondary | ICD-10-CM | POA: Insufficient documentation

## 2020-06-21 DIAGNOSIS — M5459 Other low back pain: Secondary | ICD-10-CM | POA: Diagnosis present

## 2020-06-21 DIAGNOSIS — I129 Hypertensive chronic kidney disease with stage 1 through stage 4 chronic kidney disease, or unspecified chronic kidney disease: Secondary | ICD-10-CM | POA: Diagnosis not present

## 2020-06-21 DIAGNOSIS — M47816 Spondylosis without myelopathy or radiculopathy, lumbar region: Secondary | ICD-10-CM | POA: Diagnosis not present

## 2020-06-21 DIAGNOSIS — N182 Chronic kidney disease, stage 2 (mild): Secondary | ICD-10-CM | POA: Diagnosis not present

## 2020-06-21 DIAGNOSIS — M545 Low back pain, unspecified: Secondary | ICD-10-CM | POA: Diagnosis not present

## 2020-06-21 DIAGNOSIS — Z79899 Other long term (current) drug therapy: Secondary | ICD-10-CM | POA: Diagnosis not present

## 2020-06-21 LAB — URINALYSIS, ROUTINE W REFLEX MICROSCOPIC
Bilirubin Urine: NEGATIVE
Glucose, UA: NEGATIVE mg/dL
Hgb urine dipstick: NEGATIVE
Ketones, ur: NEGATIVE mg/dL
Leukocytes,Ua: NEGATIVE
Nitrite: NEGATIVE
Protein, ur: NEGATIVE mg/dL
Specific Gravity, Urine: 1.008 (ref 1.005–1.030)
pH: 7 (ref 5.0–8.0)

## 2020-06-21 MED ORDER — PREDNISONE 10 MG PO TABS: ORAL_TABLET | ORAL | 0 refills | Status: DC

## 2020-06-21 MED ORDER — LIDOCAINE 5 % EX PTCH: 1.0000 | MEDICATED_PATCH | CUTANEOUS | 0 refills | Status: DC

## 2020-06-21 MED ORDER — PREDNISONE 50 MG PO TABS
60.0000 mg | ORAL_TABLET | Freq: Once | ORAL | Status: AC
  Administered 2020-06-21: 60 mg via ORAL
  Filled 2020-06-21: qty 1
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
## 2020-06-21 NOTE — Discharge Instructions (Signed)
Take your next dose of prednisone tomorrow evening.  I also recommend starting your pain patch as we discussed applying it to your low back at the location of your pain.  You may also use a heating pad for 20 minutes several times daily which can help low back pain.    Avoid lifting,  Bending,  Twisting or any other activity that worsens your pain over the next week.  Apply an  icepack  to your lower back for 10-15 minutes every 2 hours for the next 2 days.  You should get rechecked if your symptoms are not better over the next 5 days,  Or you develop increased pain,  Weakness in your leg(s) or loss of bladder or bowel function - these are symptoms of a worse injury.    As we discussed your blood pressure is elevated tonight.  I recommend seeing Thomas [name] for recheck of your blood pressure within the next week.  Make sure you are taking your blood pressure medication.

## 2020-06-21 NOTE — ED Triage Notes (Signed)
Pt with lower back pain since this morning, denies any injury.  Denies any radiation of pain

## 2020-06-21 NOTE — ED Notes (Signed)
Pt to X-Ray at this time.

## 2020-06-26 DIAGNOSIS — Z23 Encounter for immunization: Secondary | ICD-10-CM | POA: Diagnosis not present

## 2020-08-07 ENCOUNTER — Encounter: Payer: Self-pay | Admitting: Adult Health

## 2020-08-07 ENCOUNTER — Ambulatory Visit (INDEPENDENT_AMBULATORY_CARE_PROVIDER_SITE_OTHER): Payer: Medicare Other | Admitting: Adult Health

## 2020-08-07 VITALS — BP 151/88 | HR 65 | Ht 67.0 in | Wt 212.0 lb

## 2020-08-07 DIAGNOSIS — F0390 Unspecified dementia without behavioral disturbance: Secondary | ICD-10-CM
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
## 2020-08-07 NOTE — Patient Instructions (Addendum)
Your Plan:  Memory stable since prior visit    Follow up in 1 year or call earlier if needed     Thank you for coming to see [place] at [facility]. I hope we have been able to provide you high quality care today.  You may receive a patient satisfaction survey over the next few weeks. We would appreciate your feedback and comments so that we may continue to improve ourselves and the health of our patients.

## 2020-08-11 NOTE — Progress Notes (Signed)
I agree with the above plan 

## 2020-09-17 ENCOUNTER — Other Ambulatory Visit: Payer: Self-pay | Admitting: Gastroenterology

## 2020-10-23 ENCOUNTER — Encounter: Payer: Self-pay | Admitting: Internal Medicine

## 2020-10-23 DIAGNOSIS — R972 Elevated prostate specific antigen [PSA]: Secondary | ICD-10-CM | POA: Diagnosis not present

## 2020-11-28 ENCOUNTER — Other Ambulatory Visit: Payer: Self-pay | Admitting: Cardiology

## 2021-02-10 ENCOUNTER — Encounter: Payer: Self-pay | Admitting: Internal Medicine

## 2021-02-10 ENCOUNTER — Ambulatory Visit (INDEPENDENT_AMBULATORY_CARE_PROVIDER_SITE_OTHER): Payer: Medicare Other | Admitting: Internal Medicine

## 2021-02-10 ENCOUNTER — Telehealth: Payer: Self-pay

## 2021-02-10 VITALS — BP 158/89 | HR 66 | Temp 97.0°F | Ht 67.5 in | Wt 212.0 lb

## 2021-02-10 DIAGNOSIS — K2100 Gastro-esophageal reflux disease with esophagitis, without bleeding: Secondary | ICD-10-CM | POA: Diagnosis not present

## 2021-02-10 DIAGNOSIS — R1900 Intra-abdominal and pelvic swelling, mass and lump, unspecified site: Secondary | ICD-10-CM

## 2021-02-10 DIAGNOSIS — Z1211 Encounter for screening for malignant neoplasm of colon: Secondary | ICD-10-CM

## 2021-02-10 NOTE — Telephone Encounter (Signed)
Will call pt to schedule TCS w/Propofol ASA 3 w/Dr. Jena Gauss when next schedules are available.

## 2021-02-10 NOTE — Patient Instructions (Signed)
We will schedule an average risk screening colonoscopy ASA 3  /propofol.  Further recommendations to follow.
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
## 2021-02-23 ENCOUNTER — Encounter: Payer: Self-pay | Admitting: *Deleted

## 2021-02-23 MED ORDER — PEG 3350-KCL-NA BICARB-NACL 420 G PO SOLR: ORAL | 0 refills | Status: DC

## 2021-02-23 NOTE — Telephone Encounter (Signed)
Called pt. He has been scheduled for 7/14 at 1:00pm. Pt aware will mail prep instructions with pre-op appt. Confirmed address. Confirmed pharmacy.

## 2021-02-23 NOTE — Addendum Note (Signed)
Addended by: Armstead Peaks on: 02/23/2021 12:01 PM   Modules accepted: Orders

## 2021-03-05 ENCOUNTER — Other Ambulatory Visit: Payer: Self-pay | Admitting: Nurse Practitioner

## 2021-03-09 DIAGNOSIS — Z681 Body mass index (BMI) 19 or less, adult: Secondary | ICD-10-CM | POA: Diagnosis not present

## 2021-03-09 DIAGNOSIS — R4182 Altered mental status, unspecified: Secondary | ICD-10-CM | POA: Diagnosis not present

## 2021-03-30 NOTE — Patient Instructions (Signed)
Thomas [name]  03/30/2021     @PREFPERIOPPHARMACY @   Your procedure is scheduled on  04/02/2021.   Report to 04/04/2021 at  1130  A.M.   Call this number if you have problems the morning of surgery:  [phone]   Remember:  Follow the diet and prep instructions given to you by the office.    Take these medicines the morning of surgery with A SIP OF WATER     Amlodipine, antivert(if needed), metoprolol, prilosec.     Do not wear jewelry, make-up or nail polish.  Do not wear lotions, powders, or perfumes, or deodorant.  Do not shave 48 hours prior to surgery.  Men may shave face and neck.  Do not bring valuables to the hospital.  [facility] is not responsible for any belongings or valuables.  Contacts, dentures or bridgework may not be worn into surgery.  Leave your suitcase in the car.  After surgery it may be brought to your room.  For patients admitted to the hospital, discharge time will be determined by your treatment team.  Patients discharged the day of surgery will not be allowed to drive home and must have someone with them for 24 hours.    Special instructions:     DO NOT smoke tobacco or vape for 24 hours before your procedure.  Please read over the following fact sheets that you were given. Anesthesia Post-op Instructions and Care and Recovery After Surgery      Colonoscopy, Adult, Care After This sheet gives you information about how to care for yourself after your procedure. Your health care provider may also give you more specific instructions. If you have problems or questions, contact your health careprovider. What can I expect after the procedure? After the procedure, it is common to have: A small amount of blood in your stool for 24 hours after the procedure. Some gas. Mild cramping or bloating of your abdomen. Follow these instructions at home: Eating and drinking  Drink enough fluid to keep your urine pale yellow. Follow instructions  from your health care provider about eating or drinking restrictions. Resume your normal diet as instructed by your health care provider. Avoid heavy or fried foods that are hard to digest.  Activity Rest as told by your health care provider. Avoid sitting for a long time without moving. Get up to take short walks every 1-2 hours. This is important to improve blood flow and breathing. Ask for help if you feel weak or unsteady. Return to your normal activities as told by your health care provider. Ask your health care provider what activities are safe for you. Managing cramping and bloating  Try walking around when you have cramps or feel bloated. Apply heat to your abdomen as told by your health care provider. Use the heat source that your health care provider recommends, such as a moist heat pack or a heating pad. Place a towel between your skin and the heat source. Leave the heat on for 20-30 minutes. Remove the heat if your skin turns bright red. This is especially important if you are unable to feel pain, heat, or cold. You may have a greater risk of getting burned.  General instructions If you were given a sedative during the procedure, it can affect you for several hours. Do not drive or operate machinery until your health care provider says that it is safe. For the first 24 hours after the procedure: Do not  sign important documents. Do not drink alcohol. Do your regular daily activities at a slower pace than normal. Eat soft foods that are easy to digest. Take over-the-counter and prescription medicines only as told by your health care provider. Keep all follow-up visits as told by your health care provider. This is important. Contact a health care provider if: You have blood in your stool 2-3 days after the procedure. Get help right away if you have: More than a small spotting of blood in your stool. Large blood clots in your stool. Swelling of your abdomen. Nausea or  vomiting. A fever. Increasing pain in your abdomen that is not relieved with medicine. Summary After the procedure, it is common to have a small amount of blood in your stool. You may also have mild cramping and bloating of your abdomen. If you were given a sedative during the procedure, it can affect you for several hours. Do not drive or operate machinery until your health care provider says that it is safe. Get help right away if you have a lot of blood in your stool, nausea or vomiting, a fever, or increased pain in your abdomen. This information is not intended to replace advice given to you by your health care provider. Make sure you discuss any questions you have with your healthcare provider. Document Revised: 08/31/2019 Document Reviewed: 04/02/2019 Elsevier Patient Education  Thomas [name] After This sheet gives you information about how to care for yourself after your procedure. Your health care provider may also give you more specific instructions. If you have problems or questions, contact your health careprovider. What can I expect after the procedure? After the procedure, it is common to have: Tiredness. Forgetfulness about what happened after the procedure. Impaired judgment for important decisions. Nausea or vomiting. Some difficulty with balance. Follow these instructions at home: For the time period you were told by your health care provider:     Rest as needed. Do not participate in activities where you could fall or become injured. Do not drive or use machinery. Do not drink alcohol. Do not take sleeping pills or medicines that cause drowsiness. Do not make important decisions or sign legal documents. Do not take care of children on your own. Eating and drinking Follow the diet that is recommended by your health care provider. Drink enough fluid to keep your urine pale yellow. If you vomit: Drink water, juice, or soup when  you can drink without vomiting. Make sure you have little or no nausea before eating solid foods. General instructions Have a responsible adult stay with you for the time you are told. It is important to have someone help care for you until you are awake and alert. Take over-the-counter and prescription medicines only as told by your health care provider. If you have sleep apnea, surgery and certain medicines can increase your risk for breathing problems. Follow instructions from your health care provider about wearing your sleep device: Anytime you are sleeping, including during daytime naps. While taking prescription pain medicines, sleeping medicines, or medicines that make you drowsy. Avoid smoking. Keep all follow-up visits as told by your health care provider. This is important. Contact a health care provider if: You keep feeling nauseous or you keep vomiting. You feel light-headed. You are still sleepy or having trouble with balance after 24 hours. You develop a rash. You have a fever. You have redness or swelling around the IV site. Get help right away if: You  have trouble breathing. You have new-onset confusion at home. Summary For several hours after your procedure, you may feel tired. You may also be forgetful and have poor judgment. Have a responsible adult stay with you for the time you are told. It is important to have someone help care for you until you are awake and alert. Rest as told. Do not drive or operate machinery. Do not drink alcohol or take sleeping pills. Get help right away if you have trouble breathing, or if you suddenly become confused. This information is not intended to replace advice given to you by your health care provider. Make sure you discuss any questions you have with your healthcare provider. Document Revised: 05/22/2020 Document Reviewed: 08/09/2019 Elsevier Patient Education  2022 [employer].

## 2021-03-31 ENCOUNTER — Encounter (HOSPITAL_COMMUNITY): Payer: Self-pay

## 2021-03-31 ENCOUNTER — Encounter (HOSPITAL_COMMUNITY)
Admission: RE | Admit: 2021-03-31 | Discharge: 2021-03-31 | Disposition: A | Discharge status: Home / Self Care | Payer: Medicare Other | Source: Ambulatory Visit | Attending: Internal Medicine | Admitting: Internal Medicine

## 2021-04-01 ENCOUNTER — Telehealth: Payer: Self-pay | Admitting: Internal Medicine

## 2021-04-01 NOTE — Pre-Procedure Instructions (Signed)
        no show Received: [name], [name], RN  [name], [name], CMA Hey [name]! Thomas [name] did not show for his PAT today.

## 2021-04-01 NOTE — Progress Notes (Signed)
Pt was a no show for his PAT appt on 03/31/21. Dr Luvenia Starch office notified again today of no show. Office staff will f/u to check if procedure canceled.

## 2021-04-01 NOTE — Telephone Encounter (Signed)
Becky from Short Stay called to say that patient didn't show up for his pre op yesterday and he is still on the schedule with RMR for tomorrow.

## 2021-04-02 ENCOUNTER — Ambulatory Visit (HOSPITAL_COMMUNITY)
Admission: RE | Admit: 2021-04-02 | Discharge: 2021-04-02 | Disposition: A | Discharge status: Home / Self Care | Payer: Medicare Other | Attending: Internal Medicine | Admitting: Internal Medicine

## 2021-04-02 ENCOUNTER — Ambulatory Visit (HOSPITAL_COMMUNITY): Payer: Medicare Other | Admitting: Certified Registered"

## 2021-04-02 ENCOUNTER — Encounter (HOSPITAL_COMMUNITY)
Admission: RE | Disposition: A | Discharge status: Home / Self Care | Payer: Self-pay | Source: Home / Self Care | Attending: Internal Medicine

## 2021-04-02 ENCOUNTER — Other Ambulatory Visit: Payer: Self-pay

## 2021-04-02 ENCOUNTER — Encounter (HOSPITAL_COMMUNITY): Payer: Self-pay | Admitting: Internal Medicine

## 2021-04-02 DIAGNOSIS — Z8249 Family history of ischemic heart disease and other diseases of the circulatory system: Secondary | ICD-10-CM | POA: Diagnosis not present

## 2021-04-02 DIAGNOSIS — F0390 Unspecified dementia without behavioral disturbance: Secondary | ICD-10-CM | POA: Diagnosis not present

## 2021-04-02 DIAGNOSIS — Z79899 Other long term (current) drug therapy: Secondary | ICD-10-CM | POA: Insufficient documentation

## 2021-04-02 DIAGNOSIS — K219 Gastro-esophageal reflux disease without esophagitis: Secondary | ICD-10-CM | POA: Diagnosis not present

## 2021-04-02 DIAGNOSIS — Z833 Family history of diabetes mellitus: Secondary | ICD-10-CM | POA: Diagnosis not present

## 2021-04-02 DIAGNOSIS — K5730 Diverticulosis of large intestine without perforation or abscess without bleeding: Secondary | ICD-10-CM | POA: Diagnosis not present

## 2021-04-02 DIAGNOSIS — Z1211 Encounter for screening for malignant neoplasm of colon: Secondary | ICD-10-CM | POA: Insufficient documentation

## 2021-04-02 DIAGNOSIS — K6389 Other specified diseases of intestine: Secondary | ICD-10-CM | POA: Diagnosis not present

## 2021-04-02 HISTORY — PX: COLONOSCOPY WITH PROPOFOL: SHX5780

## 2021-04-02 SURGERY — COLONOSCOPY WITH PROPOFOL
Anesthesia: General

## 2021-04-02 MED ORDER — PROPOFOL 500 MG/50ML IV EMUL
INTRAVENOUS | Status: DC | PRN
  Administered 2021-04-02: 150 ug/kg/min via INTRAVENOUS

## 2021-04-02 MED ORDER — LACTATED RINGERS IV SOLN: INTRAVENOUS | Status: DC | PRN

## 2021-04-02 MED ORDER — LIDOCAINE HCL (CARDIAC) PF 100 MG/5ML IV SOSY
PREFILLED_SYRINGE | INTRAVENOUS | Status: DC | PRN
  Administered 2021-04-02: 50 mg via INTRAVENOUS

## 2021-04-02 MED ORDER — PROPOFOL 10 MG/ML IV BOLUS
INTRAVENOUS | Status: DC | PRN
  Administered 2021-04-02 (×2): 40 mg via INTRAVENOUS
  Administered 2021-04-02: 100 mg via INTRAVENOUS
  Administered 2021-04-02: 50 mg via INTRAVENOUS
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
## 2021-04-02 NOTE — Anesthesia Preprocedure Evaluation (Signed)
Anesthesia Evaluation  Patient identified by MRN, date of birth, ID band Patient awake    Reviewed: Allergy & Precautions, H&P , NPO status , Patient's Chart, lab work & pertinent test results, reviewed documented beta blocker date and time   Airway Mallampati: II  TM Distance: >3 FB Neck ROM: full    Dental no notable dental hx.    Pulmonary neg pulmonary ROS,    Pulmonary exam normal breath sounds clear to auscultation       Cardiovascular Exercise Tolerance: Good hypertension, negative cardio ROS   Rhythm:regular Rate:Normal     Neuro/Psych Seizures -,  PSYCHIATRIC DISORDERS Dementia    GI/Hepatic Neg liver ROS, PUD, GERD  Medicated,  Endo/Other  negative endocrine ROS  Renal/GU ARFRenal disease  negative genitourinary   Musculoskeletal   Abdominal   Peds  Hematology negative hematology ROS (+)   Anesthesia Other Findings   Reproductive/Obstetrics negative OB ROS                             Anesthesia Physical Anesthesia Plan  ASA: 2  Anesthesia Plan: General   Post-op Pain Management:    Induction:   PONV Risk Score and Plan: Propofol infusion  Airway Management Planned:   Additional Equipment:   Intra-op Plan:   Post-operative Plan:   Informed Consent: I have reviewed the patients History and Physical, chart, labs and discussed the procedure including the risks, benefits and alternatives for the proposed anesthesia with the patient or authorized representative who has indicated his/her understanding and acceptance.     Dental Advisory Given  Plan Discussed with: CRNA  Anesthesia Plan Comments:         Anesthesia Quick Evaluation

## 2021-04-02 NOTE — Op Note (Signed)
[facility] Patient Name: Thomas [name] Procedure Date: 04/02/2021 1:05 PM MRN: [id number] Date of Birth: 04-27-1951 Attending MD: Gennette Pacobert Michael Lateefah Mallery , MD CSN: [id number] Age: 70 Admit Type: Outpatient Procedure:                Colonoscopy Indications:              Screening for colorectal malignant neoplasm Providers:                Gennette Pacobert Michael Ely Ballen, MD, Criselda PeachesLurae B. Patsy LagerAlbert RN, RN,                            Edythe ClarityKelly Cox, Technician Referring MD:              Medicines:                Propofol per Anesthesia Complications:            No immediate complications. Estimated Blood Loss:     Estimated blood loss: none. Procedure:                Pre-Anesthesia Assessment:                           - Prior to the procedure, a History and Physical                            was performed, and patient medications and                            allergies were reviewed. The patient's tolerance of                            previous anesthesia was also reviewed. The risks                            and benefits of the procedure and the sedation                            options and risks were discussed with the patient.                            All questions were answered, and informed consent                            was obtained. Prior Anticoagulants: The patient has                            taken no previous anticoagulant or antiplatelet                            agents. ASA Grade Assessment: III - A patient with                            severe systemic disease. After reviewing the risks  and benefits, the patient was deemed in                            satisfactory condition to undergo the procedure.                           After obtaining informed consent, the colonoscope                            was passed under direct vision. Throughout the                            procedure, the patient's blood pressure, pulse, and                             oxygen saturations were monitored continuously. The                            [id number]-[id number] ([id number]) scope was introduced through                            the anus and advanced to the the cecum, identified                            by appendiceal orifice and ileocecal valve. The                            colonoscopy was performed without difficulty. The                            patient tolerated the procedure well. The quality                            of the bowel preparation was adequate. Scope In: 1:17:49 PM Scope Out: 1:28:06 PM Scope Withdrawal Time: 0 hours 6 minutes 2 seconds  Total Procedure Duration: 0 hours 10 minutes 17 seconds  Findings:      The perianal and digital rectal examinations were normal.      Scattered small-mouthed diverticula were found in the entire colon.       Diffusely pigmented colon consistent with melanosis      The exam was otherwise without abnormality on direct and retroflexion       views. Impression:               - Diverticulosis in the entire examined colon.                           Melanosis coli                           - The examination was otherwise normal on direct                            and retroflexion views.                           -  No specimens collected. Moderate Sedation:      Moderate (conscious) sedation was personally administered by an       anesthesia professional. The following parameters were monitored: oxygen       saturation, heart rate, blood pressure, respiratory rate, EKG, adequacy       of pulmonary ventilation, and response to care. Recommendation:           - Patient has a contact number available for                            emergencies. The signs and symptoms of potential                            delayed complications were discussed with the                            patient. Return to normal activities tomorrow.                            Written discharge instructions were provided to the                             patient.                           - Resume previous diet.                           - Continue present medications.                           - Patient has a contact number available for                            emergencies. The signs and symptoms of potential                            delayed complications were discussed with the                            patient. Return to normal activities tomorrow.                            Written discharge instructions were provided to the                            patient.                           - No repeat colonoscopy.                           - Return to GI clinic (date not yet determined). Procedure Code(s):        --- Professional ---  63875, Colonoscopy, flexible; diagnostic, including                            collection of specimen(s) by brushing or washing,                            when performed (separate procedure) Diagnosis Code(s):        --- Professional ---                           Z12.11, Encounter for screening for malignant                            neoplasm of colon                           K57.30, Diverticulosis of large intestine without                            perforation or abscess without bleeding CPT copyright 2019 American Medical Association. All rights reserved. The codes documented in this report are preliminary and upon coder review may  be revised to meet current compliance requirements. Gerrit Friends. Quyen Cutsforth, MD Gennette Pac, MD 04/02/2021 1:39:36 PM This report has been signed electronically. Number of Addenda: 0

## 2021-04-02 NOTE — Discharge Instructions (Addendum)
  Colonoscopy Discharge Instructions  Read the instructions outlined below and refer to this sheet in the next few weeks. These discharge instructions provide you with general information on caring for yourself after you leave the hospital. Your doctor may also give you specific instructions. While your treatment has been planned according to the most current medical practices available, unavoidable complications occasionally occur. If you have any problems or questions after discharge, call Dr. Jena Gauss at [phone]. ACTIVITY You may resume your regular activity, but move at a slower pace for the next 24 hours.  Take frequent rest periods for the next 24 hours.  Walking will help get rid of the air and reduce the bloated feeling in your belly (abdomen).  No driving for 24 hours (because of the medicine (anesthesia) used during the test).   Do not sign any important legal documents or operate any machinery for 24 hours (because of the anesthesia used during the test).  NUTRITION Drink plenty of fluids.  You may resume your normal diet as instructed by your doctor.  Begin with a light meal and progress to your normal diet. Heavy or fried foods are harder to digest and may make you feel sick to your stomach (nauseated).  Avoid alcoholic beverages for 24 hours or as instructed.  MEDICATIONS You may resume your normal medications unless your doctor tells you otherwise.  WHAT YOU CAN EXPECT TODAY Some feelings of bloating in the abdomen.  Passage of more gas than usual.  Spotting of blood in your stool or on the toilet paper.  IF YOU HAD POLYPS REMOVED DURING THE COLONOSCOPY: No aspirin products for 7 days or as instructed.  No alcohol for 7 days or as instructed.  Eat a soft diet for the next 24 hours.  FINDING OUT THE RESULTS OF YOUR TEST Not all test results are available during your visit. If your test results are not back during the visit, make an appointment with your caregiver to find out the  results. Do not assume everything is normal if you have not heard from your caregiver or the medical facility. It is important for you to follow up on all of your test results.  SEEK IMMEDIATE MEDICAL ATTENTION IF: You have more than a spotting of blood in your stool.  Your belly is swollen (abdominal distention).  You are nauseated or vomiting.  You have a temperature over 101.  You have abdominal pain or discomfort that is severe or gets worse throughout the day.      Diverticulosis only found today  A future colonoscopy is not recommended unless new symptoms develop  At patient request, I called Vladislav Axelson at [phone] -reviewed findings and recommendations

## 2021-04-02 NOTE — Anesthesia Postprocedure Evaluation (Signed)
Anesthesia Post Note  Patient: Thomas [name]  Procedure(s) Performed: COLONOSCOPY WITH PROPOFOL  Patient location during evaluation: Phase II Anesthesia Type: General Level of consciousness: awake Pain management: pain level controlled Vital Signs Assessment: post-procedure vital signs reviewed and stable Respiratory status: spontaneous breathing and respiratory function stable Cardiovascular status: blood pressure returned to baseline and stable Postop Assessment: no headache and no apparent nausea or vomiting Anesthetic complications: no Comments: Late entry   No notable events documented.   Last Vitals:  Vitals:   04/02/21 1332 04/02/21 1340  BP: (!) 87/61 (!) 112/24  Pulse: 65 71  Resp: 18 20  Temp: 37 C   SpO2: 97% 100%    Last Pain:  Vitals:   04/02/21 1340  TempSrc:   PainSc: 0-No pain                 Windell Norfolk

## 2021-04-02 NOTE — Progress Notes (Signed)
Message left for [facility], pt's nephew, to return call. Need to see if pt is coming for his procedure today and if pt has done his prep. Pt did not show for his PAT of Tuesday 03/31/21.

## 2021-04-02 NOTE — Anesthesia Procedure Notes (Signed)
Date/Time: 04/02/2021 1:23 PM Performed by: Julian Reil, CRNA Pre-anesthesia Checklist: Patient identified, Emergency Drugs available, Suction available and Patient being monitored Patient Re-evaluated:Patient Re-evaluated prior to induction Oxygen Delivery Method: Nasal cannula Induction Type: IV induction Placement Confirmation: positive ETCO2

## 2021-04-02 NOTE — Transfer of Care (Signed)
Immediate Anesthesia Transfer of Care Note  Patient: Thomas [name]  Procedure(s) Performed: COLONOSCOPY WITH PROPOFOL  Patient Location: Short Stay  Anesthesia Type:General  Level of Consciousness: drowsy  Airway & Oxygen Therapy: Patient Spontanous Breathing  Post-op Assessment: Report given to RN and Post -op Vital signs reviewed and stable  Post vital signs: Reviewed and stable  Last Vitals:  Vitals Value Taken Time  BP    Temp    Pulse    Resp    SpO2      Last Pain:  Vitals:   04/02/21 1315  TempSrc:   PainSc: 0-No pain      Patients Stated Pain Goal: 6 (04/02/21 1154)  Complications: No notable events documented.

## 2021-04-03 NOTE — Telephone Encounter (Signed)
Pt had procedure per chart.

## 2021-04-10 ENCOUNTER — Encounter (HOSPITAL_COMMUNITY): Payer: Self-pay | Admitting: Internal Medicine

## 2021-06-16 DIAGNOSIS — R4182 Altered mental status, unspecified: Secondary | ICD-10-CM | POA: Diagnosis not present

## 2021-06-16 DIAGNOSIS — Z23 Encounter for immunization: Secondary | ICD-10-CM | POA: Diagnosis not present

## 2021-06-16 DIAGNOSIS — Z0000 Encounter for general adult medical examination without abnormal findings: Secondary | ICD-10-CM | POA: Diagnosis not present

## 2021-06-16 DIAGNOSIS — I10 Essential (primary) hypertension: Secondary | ICD-10-CM | POA: Diagnosis not present

## 2021-06-16 DIAGNOSIS — Z1331 Encounter for screening for depression: Secondary | ICD-10-CM | POA: Diagnosis not present

## 2021-06-16 DIAGNOSIS — Z13220 Encounter for screening for lipoid disorders: Secondary | ICD-10-CM | POA: Diagnosis not present

## 2021-07-13 DIAGNOSIS — R972 Elevated prostate specific antigen [PSA]: Secondary | ICD-10-CM | POA: Diagnosis not present

## 2021-07-20 DIAGNOSIS — N400 Enlarged prostate without lower urinary tract symptoms: Secondary | ICD-10-CM | POA: Diagnosis not present

## 2021-07-20 DIAGNOSIS — R972 Elevated prostate specific antigen [PSA]: Secondary | ICD-10-CM | POA: Diagnosis not present

## 2021-08-05 ENCOUNTER — Other Ambulatory Visit: Payer: Self-pay | Admitting: Cardiology

## 2021-08-07 ENCOUNTER — Other Ambulatory Visit: Payer: Self-pay | Admitting: Cardiology

## 2021-08-10 ENCOUNTER — Ambulatory Visit: Payer: Medicare Other | Admitting: Adult Health

## 2021-08-21 DIAGNOSIS — Z6832 Body mass index (BMI) 32.0-32.9, adult: Secondary | ICD-10-CM | POA: Diagnosis not present

## 2021-08-21 DIAGNOSIS — M545 Low back pain, unspecified: Secondary | ICD-10-CM | POA: Diagnosis not present

## 2021-08-21 DIAGNOSIS — E6609 Other obesity due to excess calories: Secondary | ICD-10-CM | POA: Diagnosis not present

## 2021-09-10 DIAGNOSIS — M62830 Muscle spasm of back: Secondary | ICD-10-CM | POA: Diagnosis not present

## 2021-09-10 DIAGNOSIS — Z6832 Body mass index (BMI) 32.0-32.9, adult: Secondary | ICD-10-CM | POA: Diagnosis not present

## 2021-09-10 DIAGNOSIS — E6609 Other obesity due to excess calories: Secondary | ICD-10-CM | POA: Diagnosis not present

## 2021-09-10 DIAGNOSIS — M545 Low back pain, unspecified: Secondary | ICD-10-CM | POA: Diagnosis not present

## 2021-11-03 ENCOUNTER — Other Ambulatory Visit: Payer: Self-pay | Admitting: Cardiology

## 2022-06-25 DIAGNOSIS — Z0001 Encounter for general adult medical examination with abnormal findings: Secondary | ICD-10-CM | POA: Diagnosis not present

## 2022-06-25 DIAGNOSIS — E559 Vitamin D deficiency, unspecified: Secondary | ICD-10-CM | POA: Diagnosis not present

## 2022-06-25 DIAGNOSIS — Z1331 Encounter for screening for depression: Secondary | ICD-10-CM | POA: Diagnosis not present

## 2022-06-25 DIAGNOSIS — Z125 Encounter for screening for malignant neoplasm of prostate: Secondary | ICD-10-CM | POA: Diagnosis not present

## 2022-06-25 DIAGNOSIS — D518 Other vitamin B12 deficiency anemias: Secondary | ICD-10-CM | POA: Diagnosis not present

## 2022-06-25 DIAGNOSIS — E039 Hypothyroidism, unspecified: Secondary | ICD-10-CM | POA: Diagnosis not present

## 2022-06-25 DIAGNOSIS — I10 Essential (primary) hypertension: Secondary | ICD-10-CM | POA: Diagnosis not present

## 2022-06-25 DIAGNOSIS — Z6832 Body mass index (BMI) 32.0-32.9, adult: Secondary | ICD-10-CM | POA: Diagnosis not present

## 2022-06-25 DIAGNOSIS — F0390 Unspecified dementia without behavioral disturbance: Secondary | ICD-10-CM | POA: Diagnosis not present

## 2022-06-25 DIAGNOSIS — Z23 Encounter for immunization: Secondary | ICD-10-CM | POA: Diagnosis not present

## 2022-07-08 DIAGNOSIS — R972 Elevated prostate specific antigen [PSA]: Secondary | ICD-10-CM | POA: Diagnosis not present

## 2022-10-07 ENCOUNTER — Emergency Department (HOSPITAL_COMMUNITY)
Admission: EM | Admit: 2022-10-07 | Discharge: 2022-10-07 | Disposition: A | Discharge status: Home / Self Care | Payer: Medicare Other | Attending: Emergency Medicine | Admitting: Emergency Medicine

## 2022-10-07 ENCOUNTER — Emergency Department (HOSPITAL_COMMUNITY): Payer: Medicare Other

## 2022-10-07 ENCOUNTER — Other Ambulatory Visit: Payer: Self-pay

## 2022-10-07 ENCOUNTER — Encounter (HOSPITAL_COMMUNITY): Payer: Self-pay | Admitting: Emergency Medicine

## 2022-10-07 DIAGNOSIS — Z7982 Long term (current) use of aspirin: Secondary | ICD-10-CM | POA: Diagnosis not present

## 2022-10-07 DIAGNOSIS — I10 Essential (primary) hypertension: Secondary | ICD-10-CM | POA: Diagnosis not present

## 2022-10-07 DIAGNOSIS — R519 Headache, unspecified: Secondary | ICD-10-CM | POA: Diagnosis not present

## 2022-10-07 DIAGNOSIS — Z21 Asymptomatic human immunodeficiency virus [HIV] infection status: Secondary | ICD-10-CM | POA: Diagnosis not present

## 2022-10-07 DIAGNOSIS — Z79899 Other long term (current) drug therapy: Secondary | ICD-10-CM | POA: Insufficient documentation

## 2022-10-07 MED ORDER — KETOROLAC TROMETHAMINE 15 MG/ML IJ SOLN
15.0000 mg | Freq: Once | INTRAMUSCULAR | Status: AC
  Administered 2022-10-07: 15 mg via INTRAMUSCULAR
  Filled 2022-10-07: qty 1

## 2022-10-07 NOTE — ED Triage Notes (Signed)
Patient c/o headache x 2 days.  Patient denies history of migraines.  Patient denies photophobia and sensitivity to sounds.  Patient denies blurred vision.

## 2022-10-07 NOTE — ED Provider Notes (Signed)
[facility] EMERGENCY DEPARTMENT Provider Note   CSN: [id number] Arrival date & time: 10/07/22  1122     History  Chief Complaint  Patient presents with   Headache    Thomas [name] is a 72 y.o. male.  Patient with history of hypertension presents to the emergency department for evaluation of headache which has been ongoing for the past 2 days.  There is no preceding injury or accidents.  Patient reports that the pain is a little bit worse today.  It can be generalized but is also worse on the right top of the head.  No associated light or sound sensitivity.  No differences in position.  No vision change or vision loss.  No weakness, numbness, or tingling in the arms of the legs.  He has tried Tylenol without improvement.  He has been taking his blood pressure medications.  He is ambulating at baseline.  Denies recent URI symptoms or oral pain.       Home Medications Prior to Admission medications   Medication Sig Start Date End Date Taking? Authorizing Provider  acetaminophen (TYLENOL) 500 MG tablet Take 500 mg by mouth every 6 (six) hours as needed for mild pain or moderate pain.    [provider]  amLODipine (NORVASC) 10 MG tablet TAKE 1 TABLET BY MOUTH EVERY DAY 11/03/21   Antoine Poche, MD  aspirin EC 325 MG tablet Take 325 mg by mouth daily.    [provider]  meclizine (ANTIVERT) 25 MG tablet Take 25 mg by mouth every 6 (six) hours as needed for dizziness.    [provider]  metoprolol tartrate (LOPRESSOR) 25 MG tablet Take 25 mg by mouth 2 (two) times daily. 03/06/18   [provider]  mirtazapine (REMERON) 7.5 MG tablet Take 7.5 mg at bedtime by mouth. 08/04/17   [provider]  nitroGLYCERIN (NITROSTAT) 0.4 MG SL tablet Place 0.4 mg under the tongue every 5 (five) minutes as needed for chest pain. 11/26/19   [provider]  omeprazole (PRILOSEC) 40 MG capsule TAKE 1 CAPSULE(40 MG) BY MOUTH DAILY AS NEEDED Patient  taking differently: Take 40 mg by mouth daily. 03/05/21   Gelene Mink, NP  polyethylene glycol-electrolytes (NULYTELY) 420 g solution As directed 02/23/21   Rourk, Gerrit Friends, MD  potassium chloride SA (KLOR-CON) 20 MEQ tablet Take 40 mEq by mouth daily. 02/17/21   [provider]      Allergies    Patient has no known allergies.    Review of Systems   Review of Systems  Physical Exam Updated Vital Signs BP (!) 168/93 (BP Location: Right Arm)   Pulse (!) 58   Temp 98.3 F (36.8 C) (Oral)   Resp 18   Ht 5\' 7"  (1.702 m)   Wt 97.5 kg   SpO2 95%   BMI 33.67 kg/m  Physical Exam Vitals and nursing note reviewed.  Constitutional:      Appearance: He is well-developed.  HENT:     Head: Normocephalic and atraumatic.     Right Ear: Tympanic membrane, ear canal and external ear normal.     Left Ear: Tympanic membrane, ear canal and external ear normal.     Nose: Nose normal.     Mouth/Throat:     Pharynx: Uvula midline.  Eyes:     General: Lids are normal.     Conjunctiva/sclera: Conjunctivae normal.     Pupils: Pupils are equal, round, and reactive to light.  Comments: Disconjugate gaze noted  Cardiovascular:     Rate and Rhythm: Normal rate and regular rhythm.  Pulmonary:     Effort: Pulmonary effort is normal.     Breath sounds: Normal breath sounds.  Abdominal:     Palpations: Abdomen is soft.     Tenderness: There is no abdominal tenderness.  Musculoskeletal:        General: Normal range of motion.     Cervical back: Normal range of motion and neck supple. No tenderness or bony tenderness.  Skin:    General: Skin is warm and dry.  Neurological:     Mental Status: He is alert and oriented to person, place, and time.     GCS: GCS eye subscore is 4. GCS verbal subscore is 5. GCS motor subscore is 6.     Cranial Nerves: No cranial nerve deficit or facial asymmetry.     Sensory: No sensory deficit.     Motor: No abnormal muscle tone.     Coordination:  Coordination normal.     Gait: Gait normal.     Comments: Patient ambulates without difficulty.     ED Results / Procedures / Treatments   Labs (all labs ordered are listed, but only abnormal results are displayed) Labs Reviewed - No data to display  EKG None  Radiology No results found.  Procedures Procedures    Medications Ordered in ED Medications - No data to display  ED Course/ Medical Decision Making/ A&P    Patient seen and examined. History obtained directly from patient.  Patient's family members at bedside who provides additional history as well.  Labs/EKG: None ordered  Imaging: CT head without contrast  Medications/Fluids: None ordered  Most recent vital signs reviewed and are as follows: BP (!) 168/93 (BP Location: Right Arm)   Pulse (!) 58   Temp 98.3 F (36.8 C) (Oral)   Resp 18   Ht 5\' 7"  (1.702 m)   Wt 97.5 kg   SpO2 95%   BMI 33.67 kg/m   Initial impression: Headache, generalized but worse on the right side, reassuring neuro exam, patient appears well.  Blood pressure on arrival 168/93.  3:18 PM Reassessment performed. Patient appears stable.  Exam unchanged.  Imaging personally visualized and interpreted including: CT head, agree no acute findings.  Reviewed pertinent lab work and imaging with patient at bedside. Questions answered.   Will plan to give a dose of 15 mg Toradol IM for headache prior to discharge.  Also discussed use of muscle relaxer at bedtime in case this is arising from cervical nerves.  Patient is already taking this at home.  He will not need additional prescriptions.  Most current vital signs reviewed and are as follows: BP (!) 168/93 (BP Location: Right Arm)   Pulse (!) 58   Temp 98.3 F (36.8 C) (Oral)   Resp 18   Ht 5\' 7"  (1.702 m)   Wt 97.5 kg   SpO2 95%   BMI 33.67 kg/m   Plan: Discharge to home.   Prescriptions written for: None  Other home care instructions discussed: Ice or heat to painful  areas  ED return instructions discussed: Patient counseled to return if they have weakness in their arms or legs, slurred speech, trouble walking or talking, confusion, trouble with their balance, or if they have any other concerns. Patient verbalizes understanding and agrees with plan.   Follow-up instructions discussed: Patient encouraged to follow-up with their PCP in 3 days if not improving.  Medical Decision Making Amount and/or Complexity of Data Reviewed Radiology: ordered.  Risk Prescription drug management.   In regards to the patient's headache, critical differentials were considered including subarachnoid hemorrhage, intracerebral hemorrhage, epidural/subdural hematoma, pituitary apoplexy, vertebral/carotid artery dissection, giant cell arteritis, central venous thrombosis, reversible cerebral vasoconstriction, acute angle closure glaucoma, idiopathic intracranial hypertension, bacterial meningitis, viral encephalitis, carbon monoxide poisoning, posterior reversible encephalopathy syndrome, pre-eclampsia.   Reg flag symptoms related to these causes were considered including systemic symptoms (fever, weight loss), neurologic symptoms (confusion, mental status change, vision change, associated seizure), acute or sudden "thunderclap" onset, patient age 66 or older with new or progressive headache, patient of any age with first headache or change in headache pattern, pregnant or postpartum status, history of HIV or other immunocompromise, history of cancer, headache occurring with exertion, associated neck or shoulder pain, associated traumatic injury, concurrent use of anticoagulation, family history of spontaneous SAH, and concurrent drug use.    Other benign, more common causes of headache were considered including migraine, tension-type headache, cluster headache, referred pain from other cause such as sinus infection, dental pain, trigeminal neuralgia.    On exam, patient has a reassuring neuro exam including baseline mental status, no significant neck pain or meningeal signs, no signs of severe infection or fever.   The patient's vital signs, pertinent lab work and imaging were reviewed and interpreted as discussed in the ED course. Hospitalization was considered for further testing, treatments, or serial exams/observation. However as patient is well-appearing, has a stable exam over the course of their evaluation, and reassuring studies today, I do not feel that they warrant admission at this time. This plan was discussed with the patient who verbalizes agreement and comfort with this plan and seems reliable and able to return to the Emergency Department with worsening or changing symptoms.          Final Clinical Impression(s) / ED Diagnoses Final diagnoses:  Acute nonintractable headache, unspecified headache type    Rx / DC Orders ED Discharge Orders     None         Carlisle Cater, PA-C 10/07/22 1519    Audley Hose, MD 10/07/22 [phone]

## 2022-10-07 NOTE — Discharge Instructions (Addendum)
Please read and follow all provided instructions.  Your diagnoses today include:  1. Acute nonintractable headache, unspecified headache type     Tests performed today include: CT of your head which was normal and did not show any serious cause of your headache Vital signs. See below for your results today.   Medications:  In the Emergency Department you received: Toradol - NSAID medication similar to ibuprofen  Take any prescribed medications only as directed.  Additional information:  Follow any educational materials contained in this packet.  You are having a headache. No specific cause was found today for your headache. It may have been a migraine or other cause of headache. Stress, anxiety, fatigue, and depression are common triggers for headaches.   Your headache today does not appear to be life-threatening or require hospitalization, but often the exact cause of headaches is not determined in the emergency department. Therefore, follow-up with your doctor is very important to find out what may have caused your headache and whether or not you need any further diagnostic testing or treatment.   Sometimes headaches can appear benign (not harmful), but then more serious symptoms can develop which should prompt an immediate re-evaluation by your doctor or the emergency department.  BE VERY CAREFUL not to take multiple medicines containing Tylenol (also called acetaminophen). Doing so can lead to an overdose which can damage your liver and cause liver failure and possibly death.   Follow-up instructions: Please follow-up with your primary care provider in the next 3 days for further evaluation of your symptoms.   Return instructions:  Please return to the Emergency Department if you experience worsening symptoms. Return if the medications do not resolve your headache, if it recurs, or if you have multiple episodes of vomiting or cannot keep down fluids. Return if you have a change  from the usual headache. RETURN IMMEDIATELY IF you: Develop a sudden, severe headache Develop confusion or become poorly responsive or faint Develop a fever above 100.47F or problem breathing Have a change in speech, vision, swallowing, or understanding Develop new weakness, numbness, tingling, incoordination in your arms or legs Have a seizure Please return if you have any other emergent concerns.  Additional Information:  Your vital signs today were: BP (!) 168/93 (BP Location: Right Arm)   Pulse (!) 58   Temp 98.3 F (36.8 C) (Oral)   Resp 18   Ht 5\' 7"  (1.702 m)   Wt 97.5 kg   SpO2 95%   BMI 33.67 kg/m  If your blood pressure (BP) was elevated above 135/85 this visit, please have this repeated by your doctor within one month. --------------

## 2022-10-07 NOTE — ED Provider Triage Note (Signed)
Emergency Medicine Provider Triage Evaluation Note  Thomas [name] , a 72 y.o. male  was evaluated in triage.  Pt complains of headache.  There is localized to the right side of the head.  Describes it as an intense ache.  States it has been present for 3 days.  Has gotten better today.  Currently rates it at a 4 out of 10.  Has tried Tylenol with moderate relief.  Denies vision change, numbness, weakness, tingling, fevers.  Has neck stiffness due to previous surgery, this is unchanged.  Review of Systems  Positive: As above Negative: As above  Physical Exam  BP (!) 168/93 (BP Location: Right Arm)   Pulse (!) 58   Temp 98.3 F (36.8 C) (Oral)   Resp 18   Ht 5\' 7"  (1.702 m)   Wt 97.5 kg   SpO2 95%   BMI 33.67 kg/m  Gen:   Awake, no distress   Resp:  Normal effort  MSK:   Moves extremities without difficulty  Other:    Medical Decision Making  Medically screening exam initiated at 12:45 PM.  Appropriate orders placed.  Colleen Can Savitt was informed that the remainder of the evaluation will be completed by another provider, this initial triage assessment does not replace that evaluation, and the importance of remaining in the ED until their evaluation is complete.     Roylene Reason, [place] 10/07/22 1246

## 2023-09-15 DIAGNOSIS — I10 Essential (primary) hypertension: Secondary | ICD-10-CM | POA: Diagnosis not present

## 2023-09-15 DIAGNOSIS — Z13220 Encounter for screening for lipoid disorders: Secondary | ICD-10-CM | POA: Diagnosis not present

## 2023-09-15 DIAGNOSIS — Z0001 Encounter for general adult medical examination with abnormal findings: Secondary | ICD-10-CM | POA: Diagnosis not present

## 2023-09-15 DIAGNOSIS — E559 Vitamin D deficiency, unspecified: Secondary | ICD-10-CM | POA: Diagnosis not present

## 2023-09-30 DIAGNOSIS — M545 Low back pain, unspecified: Secondary | ICD-10-CM | POA: Diagnosis not present

## 2023-11-01 ENCOUNTER — Other Ambulatory Visit: Payer: Self-pay | Admitting: Urology

## 2023-11-01 DIAGNOSIS — R972 Elevated prostate specific antigen [PSA]: Secondary | ICD-10-CM

## 2023-12-16 ENCOUNTER — Ambulatory Visit
Admission: RE | Admit: 2023-12-16 | Discharge: 2023-12-16 | Disposition: A | Discharge status: Home / Self Care | Payer: Medicare Other | Source: Ambulatory Visit | Attending: Urology | Admitting: Urology

## 2023-12-16 DIAGNOSIS — R972 Elevated prostate specific antigen [PSA]: Secondary | ICD-10-CM

## 2023-12-16 MED ORDER — GADOPICLENOL 0.5 MMOL/ML IV SOLN
10.0000 mL | Freq: Once | INTRAVENOUS | Status: AC | PRN
  Administered 2023-12-16: 10 mL via INTRAVENOUS

## 2024-02-22 ENCOUNTER — Other Ambulatory Visit (HOSPITAL_COMMUNITY): Payer: Self-pay | Admitting: Urology

## 2024-02-22 DIAGNOSIS — C61 Malignant neoplasm of prostate: Secondary | ICD-10-CM

## 2024-02-29 ENCOUNTER — Telehealth: Payer: Self-pay | Admitting: Radiation Oncology

## 2024-02-29 NOTE — Telephone Encounter (Signed)
 Unable to LVM to offer CON with Dr. Lorri Rota

## 2024-03-01 ENCOUNTER — Telehealth: Payer: Self-pay | Admitting: Radiation Oncology

## 2024-03-01 NOTE — Telephone Encounter (Signed)
 LVM on mobile number; unable to LVM on home number to schedule CON with Dr. Lorri Rota.

## 2024-03-08 ENCOUNTER — Encounter (HOSPITAL_COMMUNITY)
Admission: RE | Admit: 2024-03-08 | Discharge: 2024-03-08 | Disposition: A | Discharge status: Home / Self Care | Source: Ambulatory Visit | Attending: Urology

## 2024-03-08 DIAGNOSIS — C61 Malignant neoplasm of prostate: Secondary | ICD-10-CM | POA: Diagnosis present

## 2024-03-08 DIAGNOSIS — K760 Fatty (change of) liver, not elsewhere classified: Secondary | ICD-10-CM | POA: Diagnosis not present

## 2024-03-08 MED ORDER — FLOTUFOLASTAT F 18 GALLIUM 296-5846 MBQ/ML IV SOLN
7.7410 | Freq: Once | INTRAVENOUS | Status: AC
  Administered 2024-03-08: 7.741 via INTRAVENOUS

## 2024-03-12 NOTE — Progress Notes (Signed)
 GU Location of Tumor / Histology: Prostate Ca  If Prostate Cancer, Gleason Score is (4 + 5) and PSA is (21.60 on 10/17/2023)  Thomas [name] presented as referral from Dr. Lonni Han [facility]) elevated PSA.  Biopsies       03/08/2024 Dr. Lonni Han NM PET (PSMA) Skull to Mid Thigh CLINICAL DATA:  Subsequent treatment strategy for tumor type.   IMPRESSION: 1. Intense radiotracer activity in the prostate gland consistent with primary prostate adenocarcinoma. 2. Single intense radiotracer avid lymph node in the pelvis ventral to the S1 vertebral body consistent with nodal metastasis. 3. No evidence of visceral metastasis or skeletal metastasis. 4. Hepatic steatosis.    12/16/2023 Dr. Lonni Han MR Prostate with/without Contrast CLINICAL DATA:  Elevated PSA level. R97.20. Benign biopsy 06/05/2010.  IMPRESSION: 1. PI-RADS category 4 lesion of the right posteromedial peripheral zone in the mid gland and apex. Potential associated transcapsular spread and neurovascular bundle involvement. Targeting data sent to [facility]. 2. Substantial prostatomegaly and benign prostatic hypertrophy.    Past/Anticipated interventions by urology, if any:  Dr. Lonni Han   Past/Anticipated interventions by medical oncology, if any: NA  Weight changes, if any:  No  IPSS:  9 SHIM:  7  Bowel/Bladder complaints, if any:  No  Nausea/Vomiting, if any: No  Pain issues, if any:  0/10  SAFETY ISSUES: Prior radiation?  No Pacemaker/ICD? No Possible current pregnancy? Male Is the patient on methotrexate? No  Current Complaints / other details:
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
## 2024-03-20 ENCOUNTER — Ambulatory Visit
Admission: RE | Admit: 2024-03-20 | Discharge: 2024-03-20 | Disposition: A | Discharge status: Home / Self Care | Source: Ambulatory Visit | Attending: Radiation Oncology | Admitting: Radiation Oncology

## 2024-03-20 ENCOUNTER — Encounter: Payer: Self-pay | Admitting: Urology

## 2024-03-20 ENCOUNTER — Encounter: Payer: Self-pay | Admitting: Radiation Oncology

## 2024-03-20 VITALS — BP 186/105 | HR 70 | Temp 97.6°F | Resp 18 | Ht 68.0 in | Wt 213.2 lb

## 2024-03-20 DIAGNOSIS — K760 Fatty (change of) liver, not elsewhere classified: Secondary | ICD-10-CM | POA: Insufficient documentation

## 2024-03-20 DIAGNOSIS — I714 Abdominal aortic aneurysm, without rupture, unspecified: Secondary | ICD-10-CM | POA: Diagnosis not present

## 2024-03-20 DIAGNOSIS — M4800 Spinal stenosis, site unspecified: Secondary | ICD-10-CM | POA: Diagnosis not present

## 2024-03-20 DIAGNOSIS — Z191 Hormone sensitive malignancy status: Secondary | ICD-10-CM | POA: Diagnosis not present

## 2024-03-20 DIAGNOSIS — C775 Secondary and unspecified malignant neoplasm of intrapelvic lymph nodes: Secondary | ICD-10-CM | POA: Insufficient documentation

## 2024-03-20 DIAGNOSIS — I10 Essential (primary) hypertension: Secondary | ICD-10-CM | POA: Diagnosis not present

## 2024-03-20 DIAGNOSIS — C61 Malignant neoplasm of prostate: Secondary | ICD-10-CM

## 2024-03-20 DIAGNOSIS — Z7982 Long term (current) use of aspirin: Secondary | ICD-10-CM | POA: Diagnosis not present

## 2024-03-20 DIAGNOSIS — Z79899 Other long term (current) drug therapy: Secondary | ICD-10-CM | POA: Diagnosis not present

## 2024-04-02 NOTE — Progress Notes (Signed)
 Spoke with patient's nephew (legal guardian) via telephone to introduce myself as the prostate nurse navigator and discussed my role.  No barriers to care identified at this time.  Patient will proceed with 8 weeks of external beam therapy concurrent with ADT. Patient had Eligard 45mg  on 7/7.  Provided by direct contact for any questions or barriers that may arise.

## 2024-04-16 ENCOUNTER — Other Ambulatory Visit: Payer: Self-pay | Admitting: Urology

## 2024-04-17 ENCOUNTER — Other Ambulatory Visit: Payer: Self-pay | Admitting: Urology

## 2024-04-17 DIAGNOSIS — C61 Malignant neoplasm of prostate: Secondary | ICD-10-CM

## 2024-05-23 ENCOUNTER — Encounter (HOSPITAL_COMMUNITY): Payer: Self-pay | Admitting: Urology

## 2024-05-23 NOTE — Progress Notes (Addendum)
 Spoke w/ via phone for pre-op interview--- Lyndy pt nephew and legal guardian Lab needs dos----    BMP and EKG     Lab results------ COVID test -----patient states asymptomatic no test needed Arrive at -------0630 NPO after MN NO Solid Food.   Pre-Surgery Ensure or G2:  Med rec completed Medications to take morning of surgery ----- Norvasc , Metoprolol  and Prilosec. Diabetic medication -----  GLP1 agonist last dose: GLP1 instructions:  Patient instructed no nail polish to be worn day of surgery Patient instructed to bring photo id and insurance card day of surgery Patient aware to have Driver (ride ) / caregiver    for 24 hours after surgery - Lyndy Hymen nephew Patient Special Instructions ----- Fleet enema per surgeons instructions. Pre-Op special Instructions ----- Pt nephew Lena Fieldhouse (legal guardian) will need to go to preop with patient, also stated he signs patient consents. NS IV fluid pt has Rocephin ordered.  Patient verbalized understanding of instructions that were given at this phone interview. Patient denies chest pain, sob, fever, cough at the interview.

## 2024-05-29 ENCOUNTER — Telehealth: Payer: Self-pay | Admitting: *Deleted

## 2024-05-29 ENCOUNTER — Ambulatory Visit (HOSPITAL_COMMUNITY)
Admission: RE | Admit: 2024-05-29 | Discharge: 2024-05-29 | Disposition: A | Discharge status: Home / Self Care | Attending: Urology | Admitting: Urology

## 2024-05-29 ENCOUNTER — Encounter (HOSPITAL_COMMUNITY)
Admission: RE | Disposition: A | Discharge status: Home / Self Care | Payer: Self-pay | Source: Home / Self Care | Attending: Urology

## 2024-05-29 ENCOUNTER — Other Ambulatory Visit: Payer: Self-pay

## 2024-05-29 ENCOUNTER — Encounter (HOSPITAL_COMMUNITY): Payer: Self-pay | Admitting: Urology

## 2024-05-29 ENCOUNTER — Ambulatory Visit (HOSPITAL_BASED_OUTPATIENT_CLINIC_OR_DEPARTMENT_OTHER)

## 2024-05-29 ENCOUNTER — Ambulatory Visit (HOSPITAL_COMMUNITY)

## 2024-05-29 DIAGNOSIS — R569 Unspecified convulsions: Secondary | ICD-10-CM | POA: Insufficient documentation

## 2024-05-29 DIAGNOSIS — K279 Peptic ulcer, site unspecified, unspecified as acute or chronic, without hemorrhage or perforation: Secondary | ICD-10-CM | POA: Insufficient documentation

## 2024-05-29 DIAGNOSIS — C61 Malignant neoplasm of prostate: Secondary | ICD-10-CM | POA: Diagnosis present

## 2024-05-29 DIAGNOSIS — M1990 Unspecified osteoarthritis, unspecified site: Secondary | ICD-10-CM | POA: Insufficient documentation

## 2024-05-29 DIAGNOSIS — K219 Gastro-esophageal reflux disease without esophagitis: Secondary | ICD-10-CM | POA: Diagnosis not present

## 2024-05-29 DIAGNOSIS — I10 Essential (primary) hypertension: Secondary | ICD-10-CM | POA: Insufficient documentation

## 2024-05-29 HISTORY — PX: GOLD SEED IMPLANT: SHX6343

## 2024-05-29 HISTORY — PX: SPACE OAR INSTILLATION: SHX6769

## 2024-05-29 LAB — BASIC METABOLIC PANEL WITH GFR
Anion gap: 16 — ABNORMAL HIGH (ref 5–15)
BUN: 9 mg/dL (ref 8–23)
CO2: 21 mmol/L — ABNORMAL LOW (ref 22–32)
Calcium: 9.4 mg/dL (ref 8.9–10.3)
Chloride: 103 mmol/L (ref 98–111)
Creatinine, Ser: 0.89 mg/dL (ref 0.61–1.24)
GFR, Estimated: >60 mL/min (ref >60)
Glucose, Bld: 128 mg/dL — ABNORMAL HIGH (ref 70–99)
Potassium: 4.3 mmol/L (ref 3.5–5.1)
Sodium: 140 mmol/L (ref 135–145)

## 2024-05-29 SURGERY — INSERTION, GOLD SEEDS
Anesthesia: General

## 2024-05-29 MED ORDER — EPHEDRINE 5 MG/ML INJ
INTRAVENOUS | Status: AC
  Filled 2024-05-29: qty 10

## 2024-05-29 MED ORDER — SODIUM CHLORIDE (PF) 0.9 % IJ SOLN
INTRAMUSCULAR | Status: DC | PRN
  Administered 2024-05-29: 3 mL
  Administered 2024-05-29: 8 mL

## 2024-05-29 MED ORDER — PHENYLEPHRINE 80 MCG/ML (10ML) SYRINGE FOR IV PUSH (FOR BLOOD PRESSURE SUPPORT)
PREFILLED_SYRINGE | INTRAVENOUS | Status: DC | PRN
Start: 2024-05-29 — End: 2024-05-29
  Administered 2024-05-29: 160 ug via INTRAVENOUS
  Administered 2024-05-29: 80 ug via INTRAVENOUS

## 2024-05-29 MED ORDER — CHLORHEXIDINE GLUCONATE 0.12 % MT SOLN
OROMUCOSAL | Status: AC
  Filled 2024-05-29: qty 15

## 2024-05-29 MED ORDER — DEXAMETHASONE SODIUM PHOSPHATE 10 MG/ML IJ SOLN
INTRAMUSCULAR | Status: AC
  Filled 2024-05-29: qty 1

## 2024-05-29 MED ORDER — ONDANSETRON HCL 4 MG/2ML IJ SOLN
INTRAMUSCULAR | Status: AC
  Filled 2024-05-29: qty 2

## 2024-05-29 MED ORDER — SODIUM CHLORIDE 0.9 % IV SOLN: INTRAVENOUS | Status: DC

## 2024-05-29 MED ORDER — ALBUMIN HUMAN 5 % IV SOLN: INTRAVENOUS | Status: DC | PRN

## 2024-05-29 MED ORDER — LIDOCAINE HCL (PF) 1 % IJ SOLN
INTRAMUSCULAR | Status: DC | PRN
  Administered 2024-05-29: 10 mL

## 2024-05-29 MED ORDER — FENTANYL CITRATE (PF) 250 MCG/5ML IJ SOLN
INTRAMUSCULAR | Status: DC | PRN
  Administered 2024-05-29: 25 ug via INTRAVENOUS

## 2024-05-29 MED ORDER — EPHEDRINE SULFATE-NACL 50-0.9 MG/10ML-% IV SOSY
PREFILLED_SYRINGE | INTRAVENOUS | Status: DC | PRN
  Administered 2024-05-29 (×2): 10 mg via INTRAVENOUS

## 2024-05-29 MED ORDER — CEFTRIAXONE SODIUM 2 G IJ SOLR
2.0000 g | Freq: Once | INTRAMUSCULAR | Status: AC
  Administered 2024-05-29: 2 g via INTRAVENOUS
  Filled 2024-05-29: qty 20

## 2024-05-29 MED ORDER — PROPOFOL 10 MG/ML IV BOLUS
INTRAVENOUS | Status: AC
  Filled 2024-05-29: qty 20

## 2024-05-29 MED ORDER — SODIUM CHLORIDE (PF) 0.9 % IJ SOLN
INTRAMUSCULAR | Status: AC
  Filled 2024-05-29: qty 10

## 2024-05-29 MED ORDER — CEFAZOLIN SODIUM-DEXTROSE 2-4 GM/100ML-% IV SOLN: 2.0000 g | INTRAVENOUS | Status: DC

## 2024-05-29 MED ORDER — PROPOFOL 10 MG/ML IV BOLUS
INTRAVENOUS | Status: DC | PRN
  Administered 2024-05-29: 150 mg via INTRAVENOUS
  Administered 2024-05-29: 50 mg via INTRAVENOUS

## 2024-05-29 MED ORDER — FENTANYL CITRATE (PF) 250 MCG/5ML IJ SOLN
INTRAMUSCULAR | Status: AC
  Filled 2024-05-29: qty 5

## 2024-05-29 MED ORDER — ONDANSETRON HCL 4 MG/2ML IJ SOLN
INTRAMUSCULAR | Status: DC | PRN
  Administered 2024-05-29: 4 mg via INTRAVENOUS

## 2024-05-29 MED ORDER — DEXAMETHASONE SODIUM PHOSPHATE 10 MG/ML IJ SOLN
INTRAMUSCULAR | Status: DC | PRN
Start: 2024-05-29 — End: 2024-05-29
  Administered 2024-05-29: 5 mg via INTRAVENOUS

## 2024-05-29 MED ORDER — VASOPRESSIN 20 UNIT/ML IV SOLN
INTRAVENOUS | Status: AC
  Filled 2024-05-29: qty 1

## 2024-05-29 MED ORDER — VASOPRESSIN 20 UNIT/ML IV SOLN
INTRAVENOUS | Status: DC | PRN
  Administered 2024-05-29 (×3): 1 [IU] via INTRAVENOUS

## 2024-05-29 MED ORDER — ORAL CARE MOUTH RINSE: 15.0000 mL | Freq: Once | OROMUCOSAL | Status: AC

## 2024-05-29 MED ORDER — ACETAMINOPHEN 10 MG/ML IV SOLN
INTRAVENOUS | Status: DC | PRN
  Administered 2024-05-29: 1000 mg via INTRAVENOUS

## 2024-05-29 MED ORDER — CHLORHEXIDINE GLUCONATE 0.12 % MT SOLN
15.0000 mL | Freq: Once | OROMUCOSAL | Status: AC
  Administered 2024-05-29: 15 mL via OROMUCOSAL

## 2024-05-29 MED ORDER — LIDOCAINE 2% (20 MG/ML) 5 ML SYRINGE
INTRAMUSCULAR | Status: AC
  Filled 2024-05-29: qty 5

## 2024-05-29 MED ORDER — LIDOCAINE 2% (20 MG/ML) 5 ML SYRINGE
INTRAMUSCULAR | Status: DC | PRN
  Administered 2024-05-29: 60 mg via INTRAVENOUS

## 2024-05-29 SURGICAL SUPPLY — 20 items
BLADE CLIPPER SENSICLIP SURGIC (BLADE) ×2 IMPLANT
CNTNR URN SCR LID CUP LEK RST (MISCELLANEOUS) ×2 IMPLANT
COVER BACK TABLE 60X90IN (DRAPES) ×2 IMPLANT
DRSG TEGADERM 4X4.75 (GAUZE/BANDAGES/DRESSINGS) ×2 IMPLANT
DRSG TEGADERM 8X12 (GAUZE/BANDAGES/DRESSINGS) ×2 IMPLANT
GAUZE SPONGE 4X4 12PLY STRL (GAUZE/BANDAGES/DRESSINGS) ×2 IMPLANT
GLOVE BIO SURGEON STRL SZ8 (GLOVE) ×2 IMPLANT
IMPL SPACEOAR SYSTEM 10ML (Spacer) ×2 IMPLANT
MARKER GOLD PRELOAD 1.2X3 (Urological Implant) ×2 IMPLANT
MARKER SKIN DUAL TIP RULER LAB (MISCELLANEOUS) ×2 IMPLANT
NDL SPNL 22GX3.5 QUINCKE BK (NEEDLE) ×2 IMPLANT
NEEDLE SPNL 22GX3.5 QUINCKE BK (NEEDLE) ×1 IMPLANT
SHEATH ULTRASOUND LF (SHEATH) IMPLANT
SHEATH ULTRASOUND LTX NONSTRL (SHEATH) ×2 IMPLANT
SLEEVE SCD COMPRESS KNEE MED (STOCKING) ×2 IMPLANT
SURGILUBE 2OZ TUBE FLIPTOP (MISCELLANEOUS) ×2 IMPLANT
SYR 10ML LL (SYRINGE) IMPLANT
SYR CONTROL 10ML LL (SYRINGE) ×2 IMPLANT
TOWEL GREEN STERILE (TOWEL DISPOSABLE) ×2 IMPLANT
UNDERPAD 30X36 HEAVY ABSORB (UNDERPADS AND DIAPERS) ×2 IMPLANT

## 2024-05-29 NOTE — Transfer of Care (Signed)
 Immediate Anesthesia Transfer of Care Note  Patient: Thomas [name]  Procedure(s) Performed: INSERTION, GOLD SEEDS INJECTION, HYDROGEL SPACER  Patient Location: PACU  Anesthesia Type:General  Level of Consciousness: drowsy and responds to stimulation  Airway & Oxygen  Therapy: Patient Spontanous Breathing and Patient connected to face mask oxygen   Post-op Assessment: Report given to RN and Post -op Vital signs reviewed and stable  Post vital signs: Reviewed and stable  Last Vitals:  Vitals Value Taken Time  BP 122/77 05/29/24 09:30  Temp 36.7 C 05/29/24 09:24  Pulse 68 05/29/24 09:31  Resp 20 05/29/24 09:31  SpO2 100 % 05/29/24 09:31  Vitals shown include unfiled device data.  Last Pain:  Vitals:   05/29/24 0643  TempSrc: Oral  PainSc: 0-No pain         Complications: No notable events documented.

## 2024-05-29 NOTE — Interval H&P Note (Signed)
 History and Physical Interval Note:  05/29/2024 7:24 AM  Thomas [name]  has presented today for surgery, with the diagnosis of PROSTATE CANCER.  The various methods of treatment have been discussed with the patient and family. After consideration of risks, benefits and other options for treatment, the patient has consented to  Procedure(s) with comments: INSERTION, GOLD SEEDS (N/A) - GOLD SEED IMPLANTS AND SPACEOAR INJECTION, HYDROGEL SPACER (N/A) as a surgical intervention.  The patient's history has been reviewed, patient examined, no change in status, stable for surgery.  I have reviewed the patient's chart and labs.  Questions were answered to the patient's satisfaction.     Lonni Righter Unika Nazareno

## 2024-05-29 NOTE — Anesthesia Procedure Notes (Signed)
 Procedure Name: LMA Insertion Date/Time: 05/29/2024 8:47 AM  Performed by: Tommas Cena SQUIBB, RNPre-anesthesia Checklist: Patient identified, Emergency Drugs available, Suction available and Patient being monitored Patient Re-evaluated:Patient Re-evaluated prior to induction Oxygen  Delivery Method: Circle system utilized Preoxygenation: Pre-oxygenation with 100% oxygen  Induction Type: IV induction LMA: LMA inserted LMA Size: 4.0 Number of attempts: 1 Tube secured with: Tape Comments: Preformed by SRNA, MDA and CRNA present

## 2024-05-29 NOTE — Telephone Encounter (Signed)
 Called patient's nephew Devaris Quirk to inform of sim and MRI for 05-31-24, patient to arrive @ [facility] and check in @ 10:15 am , patient to arrive with a full bladder, patient to arrive @ hospital @ 11:30 am for MRI, spoke with patient's nephew Rollins Wrightson and he is aware of these appts. and the instructions.

## 2024-05-29 NOTE — Op Note (Signed)
 Operative Note   Preoperative diagnosis:  1.  Grade 5 prostate cancer   Postoperative diagnosis: 1.  Same    Procedure(s): 1.  Transrectal ultrasound guided prostatic gold seed fiducial marker and Space OAR placement   Surgeon: Lonni Han, MD   Assistants:  None    Anesthesia:  MAC   Complications:  None   EBL:  <5 mL   Specimens: 1. None   Drains/Catheters: 1.  None   Intraoperative findings:   Fiducial markers were placed at the prostatic base, mid-gland and apex.  SpaceOAR was placed with good separation of the prostate and rectum.    Indication: Thomas [name] is a 73 y.o. male with grade 5 prostate cancer.  He has elected to proceed with XRT as primary treatment and is here today for gold seed fiducial marker and SpaceOAR placement.  He has been consented for the above procedures, voices understanding and wishes to proceed.    Description of procedure:   After informed consent the patient was brought to the major OR, placed on the table and administered general anesthesia. He was then moved to the modified lithotomy position with his perineum perpendicular to the floor. His perineum and genitalia were then sterilely prepped. An official timeout was then performed.    Real time transrectal ultrasonography was used visualize the prostate.  Gold seed fiducial markers were then placed transperineally at the prostatic base, mid gland and apex.   I then proceeded with placement of SpaceOAR by introducing a needle with the bevel angled inferiorly approximately 2 cm superior to the anus. This was angled downward and under direct ultrasound was placed within the space between the prostatic capsule and rectum. This was confirmed with a small amount of sterile saline injected and this was performed under direct ultrasound. I then attached the SpaceOAR to the needle and injected this in the space between the prostate and rectum with good placement noted.  The patient tolerated the  procedure well and was transferred to the postanesthesia in stable condition.   Plan: Follow-up in 4 months with PSA

## 2024-05-29 NOTE — Anesthesia Preprocedure Evaluation (Signed)
 Anesthesia Evaluation  Patient identified by MRN, date of birth, ID band Patient awake    Reviewed: Allergy & Precautions, NPO status , Patient's Chart, lab work & pertinent test results  History of Anesthesia Complications Negative for: history of anesthetic complications  Airway Mallampati: II  TM Distance: >3 FB Neck ROM: Full    Dental  (+) Edentulous Upper, Edentulous Lower, Upper Dentures, Lower Dentures, Dental Advisory Given   Pulmonary neg shortness of breath, neg sleep apnea, neg COPD, neg recent URI   breath sounds clear to auscultation       Cardiovascular hypertension, Pt. on medications and Pt. on home beta blockers (-) angina (-) Past MI, (-) Cardiac Stents and (-) CHF  Rhythm:Regular  Left ventricle: The cavity size was normal. Wall thickness was    increased in a pattern of mild LVH. Systolic function was normal.    The estimated ejection fraction was in the range of 60% to 65%.    Wall motion was normal; there were no regional wall motion    abnormalities. Doppler parameters are consistent with abnormal    left ventricular relaxation (grade 1 diastolic dysfunction).  - Aortic valve: Valve area (VTI): 5.11 cm^2. Valve area (Vmax):    4.27 cm^2. Valve area (Vmean): 4.22 cm^2.  - Technically adequate study.     Neuro/Psych Seizures -,     GI/Hepatic Neg liver ROS, PUD,GERD  Medicated and Controlled,,  Endo/Other  negative endocrine ROS    Renal/GU Lab Results      Component                Value               Date                      NA                       140                 05/14/2019                K                        4.1                 05/14/2019                CO2                      26                  05/14/2019                GLUCOSE                  119 (H)             05/14/2019                BUN                      8                   05/14/2019                CREATININE  0.96                05/14/2019                CALCIUM                  9.5                 05/14/2019                GFRNONAA                 >60                 05/14/2019                Musculoskeletal  (+) Arthritis ,    Abdominal   Peds  Hematology negative hematology ROS (+) Lab Results      Component                Value               Date                      WBC                      6.6                 05/14/2019                HGB                      13.6                05/14/2019                HCT                      41.7                05/14/2019                MCV                      92.3                05/14/2019                PLT                      319                 05/14/2019              Anesthesia Other Findings   Reproductive/Obstetrics                              Anesthesia Physical Anesthesia Plan  ASA: 2  Anesthesia Plan: General   Post-op Pain Management: Toradol  IV (intra-op)*   Induction: Intravenous  PONV Risk Score and Plan: 3 and Ondansetron  and Dexamethasone   Airway Management Planned: LMA  Additional Equipment: None  Intra-op Plan:   Post-operative Plan: Extubation in OR  Informed Consent: I have reviewed the patients History and Physical, chart, labs and discussed the procedure including the risks, benefits and alternatives for the proposed anesthesia with the patient or  authorized representative who has indicated his/her understanding and acceptance.     Dental advisory given  Plan Discussed with: CRNA  Anesthesia Plan Comments:          Anesthesia Quick Evaluation

## 2024-05-29 NOTE — Discharge Instructions (Signed)
  Post Anesthesia Home Care Instructions  Activity: Get plenty of rest for the remainder of the day. A responsible individual must stay with you for 24 hours following the procedure.  For the next 24 hours, DO NOT: -Drive a car -Advertising copywriter -Drink alcoholic beverages -Take any medication unless instructed by your physician -Make any legal decisions or sign important papers.  Meals: Start with liquid foods such as gelatin or soup. Progress to regular foods as tolerated. Avoid greasy, spicy, heavy foods. If nausea and/or vomiting occur, drink only clear liquids until the nausea and/or vomiting subsides. Call your physician if vomiting continues.  Special Instructions/Symptoms: Your throat may feel dry or sore from the anesthesia or the breathing tube placed in your throat during surgery. If this causes discomfort, gargle with warm salt water. The discomfort should disappear within 24 hours.  No acetaminophen/Tylenol until after 3 pm today if needed.

## 2024-05-29 NOTE — H&P (Signed)
 Office Visit Report     03/26/2024   --------------------------------------------------------------------------------   Thomas [name]  MRN: [id number]  DOB: [birth year], 73 year old Male  PRIMARY CARE:  Jerilynn Carnes, MD  PRIMARY CARE FAX:  [phone]  REFERRING:  Lonni LABOR. Devere, MD  PROVIDER:  Lonni Devere, M.D.  LOCATION:  [facility] [phone]     --------------------------------------------------------------------------------   CC/HPI: Prostate cancer   Thomas [name] is a 73 year old male with T1c, grade 5 prostate cancer.   Last PSA: 21.6 (09/2023), 7.29 (06/2022), 6.72 (06/2021), 6.77 (10/2020), 5.09 (03/2020), 6.47 (02/2019), 7.01 (10/2015). His PSA is undulated between 2.75 and 7.56 over the past several years. He underwent a prostate biopsy in 2011 that was negative in all cores, but revealed a 90 g prostate. **previously took finasteride, but stopped the medication several years ago.  Prostate volume: 160 cm   10/17/2023: The patient is here today for routine follow-up. He missed his follow-up last year. He reports no new or bothersome LUTS. Denies interval UTIs, dysuria or hematuria. No new health issues. Doing well.   02/20/2024: The patient presents today for routine follow-up after his recent MRI fusion prostate biopsy, which revealed multifocal grade's 2-5 prostate cancer in 9 out of 16 cores. He has done well after his prostate biopsy and denies residual hematuria, dysuria or blood per rectum.   03/26/24: The patient is here today for a routine follow-up. His recent PSMA PET/CT showed a solitary pelvic met. He is planning to undergo 8 weeks of XRT in September and is here today to start LT-ADT.     ALLERGIES: No Allergies    MEDICATIONS: levoFLOXacin 750 MG Tablet 1 tablet PO As Directed Take one hour prior to your scheduled procedure.  amLODIPine  Besylate 5 MG Tablet Oral  Bisoprolol -hydroCHLOROthiazide 2.5-6.25 MG Tablet 1 Oral Daily  NexIUM  40  MG Capsule Delayed Release Oral     GU PSH: Prostate Needle Biopsy - 02/09/2024       PSH Notes: Hernia Repair   NON-GU PSH: Hernia Repair - 2011 Surgical Pathology, Gross And Microscopic Examination For Prostate Needle - 02/09/2024 Visit Complexity (formerly GPC1X) - 02/20/2024, 10/17/2023     GU PMH: BPH w/o LUTS - 02/20/2024, - 10/17/2023, - 2022, - 2018 Prostate Cancer - 02/20/2024 Elevated PSA - 02/09/2024, - 10/17/2023, - 2022, Elevated prostate specific antigen (PSA), - 2017 BPH w/LUTS, Benign prostatic hyperplasia with urinary obstruction - 2017, Benign prostatic hyperplasia with urinary obstruction, - 2017 Nocturia, Nocturia - 2017    NON-GU PMH: Encounter for general adult medical examination without abnormal findings, Encounter for preventive health examination - 2017 Personal history of other diseases of the circulatory system, History of hypertension - 2014    FAMILY HISTORY: Diabetes - Brother, Sister Family Health Status Number - No Family History Father Deceased At Age1 ___ - Runs In Family Mother Deceased At Age 80 from diabetic complicati - Runs In Family Stroke Syndrome - Mother   SOCIAL HISTORY: Marital Status: Single     Notes: Alcohol Use, Never A Smoker, Caffeine Use, Marital History - Single, Tobacco Use, Occupation:   REVIEW OF SYSTEMS:    GU Review Male:   Patient denies frequent urination, hard to postpone urination, burning/ pain with urination, get up at night to urinate, leakage of urine, stream starts and stops, trouble starting your stream, have to strain to urinate , erection problems, and penile pain.  Gastrointestinal (Upper):   Patient denies nausea, vomiting, and indigestion/ heartburn.  Gastrointestinal (Lower):  Patient denies diarrhea and constipation.  Constitutional:   Patient denies fever, night sweats, weight loss, and fatigue.  Skin:   Patient denies skin rash/ lesion and itching.  Eyes:   Patient denies blurred vision and double vision.   Ears/ Nose/ Throat:   Patient denies sinus problems and sore throat.  Hematologic/Lymphatic:   Patient denies swollen glands and easy bruising.  Cardiovascular:   Patient denies leg swelling and chest pains.  Respiratory:   Patient denies cough and shortness of breath.  Endocrine:   Patient denies excessive thirst.  Musculoskeletal:   Patient denies back pain and joint pain.  Neurological:   Patient denies headaches and dizziness.  Psychologic:   Patient denies depression and anxiety.   VITAL SIGNS: None   MULTI-SYSTEM PHYSICAL EXAMINATION:    Constitutional: Well-nourished. No physical deformities. Normally developed. Good grooming.  Neurologic / Psychiatric: Oriented to time, oriented to place, oriented to person. No depression, no anxiety, no agitation.  Musculoskeletal: Normal gait and station of head and neck.     Complexity of Data:  Lab Test Review:   PSA  Records Review:   Pathology Reports  X-Ray Review: PET- PSMA Scan: Reviewed Report. Discussed With Patient.     10/17/23 07/08/22 07/13/21 10/23/20 04/03/20 02/22/19 11/18/15 08/02/14  PSA  Total PSA 21.60 ng/mL 7.29 ng/mL 6.72 ng/mL 6.77 ng/mL 5.09 ng/mL 6.47 ng/mL 7.01  3.30   Free PSA  1.52 ng/mL     1.24    % Free PSA  21 % PSA     18      PROCEDURES:          Visit Complexity - G2211          Eligard 45mg / 6 Month - 96402, K0782 The injection site was sterilely prepped with alcohol. Eligard was injected subcutaneously ([place]) using standard technique. The patient tolerated the procedure well. A band aid was applied. The site was dry when the patient left the exam room. The patient will return as scheduled.  45 mg was given and zero was wasted.   Qty: 45 Adm. By: Willy Ogles  Unit: mg Adm. On: 03/26/2024 02:59 PM  Route: SQ Lot No: [id number]  Freq: None Exp. Date: 05/21/2025    Mfgr.:   Site: Right Hip   ASSESSMENT:      ICD-10 Details  1 GU:   Prostate Cancer - C61 Chronic, Stable  2   Metastatic pelvic  lymphadenopathy - C77.5 Undiagnosed New Problem   PLAN:           Schedule Labs: 6 Months - PSA    6 Months - Total Testosterone  Return Visit/Planned Activity: 6 Months - Office Visit, Follow up MD, Eligard          Document Letter(s):  Created for Jerilynn Carnes, MD   Created for Patient: Clinical Summary         Notes:    -6 month Eligard injection today. I explained that ADT has a variety of adverse effects including hot flashes, loss of libido and erectile dysfunction, shrinkage of the penis and testicles, loss of muscle mass and strength, fatigue, depression, hair loss, osteoporosis, greater incidence of clinical fractures, obesity, insulin resistance, alterations in lipid profile and greater risk for diabetes and cardiovascular disease.  -Plan for gold seed fiducials and SpaceOAR placement in September

## 2024-05-30 ENCOUNTER — Encounter (HOSPITAL_COMMUNITY): Payer: Self-pay | Admitting: Urology

## 2024-05-31 ENCOUNTER — Ambulatory Visit (HOSPITAL_COMMUNITY)
Admission: RE | Admit: 2024-05-31 | Discharge: 2024-05-31 | Disposition: A | Discharge status: Home / Self Care | Source: Ambulatory Visit | Attending: Urology | Admitting: Urology

## 2024-05-31 ENCOUNTER — Ambulatory Visit
Admission: RE | Admit: 2024-05-31 | Discharge: 2024-05-31 | Disposition: A | Discharge status: Home / Self Care | Source: Ambulatory Visit | Attending: Radiation Oncology | Admitting: Radiation Oncology

## 2024-05-31 DIAGNOSIS — C61 Malignant neoplasm of prostate: Secondary | ICD-10-CM | POA: Insufficient documentation

## 2024-05-31 DIAGNOSIS — Z510 Encounter for antineoplastic radiation therapy: Secondary | ICD-10-CM | POA: Insufficient documentation

## 2024-05-31 NOTE — Progress Notes (Signed)
  Radiation Oncology         [phone]) [phone] ________________________________  Name: Thomas [name] MRN: [id number]  Date: 05/31/2024  DOB: 05/29/1951  SIMULATION AND TREATMENT PLANNING NOTE    ICD-10-CM   1. Malignant neoplasm of prostate (HCC)  C61       DIAGNOSIS:  73 y.o. gentleman with oligometastatic, Gleason 4+5, adenocarcinoma of the prostate involving a solitary pelvic lymph node with PSA of 21.6.   NARRATIVE:  The patient was brought to the CT Simulation planning suite.  Identity was confirmed.  All relevant records and images related to the planned course of therapy were reviewed.  The patient freely provided informed written consent to proceed with treatment after reviewing the details related to the planned course of therapy. The consent form was witnessed and verified by the simulation staff.  Then, the patient was set-up in a stable reproducible supine position for radiation therapy.  A vacuum lock pillow device was custom fabricated to position his legs in a reproducible immobilized position.  Then, I performed a urethrogram under sterile conditions to identify the prostatic apex.  CT images were obtained.  Surface markings were placed.  The CT images were loaded into the planning software.  Then the prostate target and avoidance structures including the rectum, bladder, bowel and hips were contoured.  Treatment planning then occurred.  The radiation prescription was entered and confirmed.  A total of one complex treatment devices was fabricated. I have requested : Intensity Modulated Radiotherapy (IMRT) is medically necessary for this case for the following reason:  Rectal sparing.SABRA  PLAN:   The prostate, seminal vesicles, and pelvic lymph nodes will initially be treated to 45 Gy in 25 fractions of 1.8 Gy followed by a boost to the prostate and PET positive lymph node, to 75 Gy with 15 additional fractions of 2.0 Gy   ________________________________  Donnice FELIX Patrcia, M.D.

## 2024-06-04 NOTE — Anesthesia Postprocedure Evaluation (Signed)
 Anesthesia Post Note  Patient: Thomas [name]  Procedure(s) Performed: INSERTION, GOLD SEEDS INJECTION, HYDROGEL SPACER     Patient location during evaluation: PACU Anesthesia Type: General Level of consciousness: awake and alert Pain management: pain level controlled Vital Signs Assessment: post-procedure vital signs reviewed and stable Respiratory status: spontaneous breathing, nonlabored ventilation and respiratory function stable Cardiovascular status: blood pressure returned to baseline and stable Postop Assessment: no apparent nausea or vomiting Anesthetic complications: no   No notable events documented.                Yaelis Scharfenberg

## 2024-06-11 DIAGNOSIS — Z510 Encounter for antineoplastic radiation therapy: Secondary | ICD-10-CM | POA: Diagnosis not present

## 2024-06-14 ENCOUNTER — Ambulatory Visit
Admission: RE | Admit: 2024-06-14 | Discharge: 2024-06-14 | Disposition: A | Discharge status: Home / Self Care | Source: Ambulatory Visit | Attending: Radiation Oncology

## 2024-06-14 ENCOUNTER — Other Ambulatory Visit: Payer: Self-pay

## 2024-06-14 ENCOUNTER — Ambulatory Visit
Admission: RE | Admit: 2024-06-14 | Discharge: 2024-06-14 | Disposition: A | Discharge status: Home / Self Care | Source: Ambulatory Visit | Attending: Radiation Oncology | Admitting: Radiation Oncology

## 2024-06-14 DIAGNOSIS — Z510 Encounter for antineoplastic radiation therapy: Secondary | ICD-10-CM | POA: Diagnosis not present

## 2024-06-14 LAB — RAD ONC ARIA SESSION SUMMARY
Course Elapsed Days: 0
Plan Fractions Treated to Date: 1
Plan Prescribed Dose Per Fraction: 1.8 Gy
Plan Total Fractions Prescribed: 25
Plan Total Prescribed Dose: 45 Gy
Reference Point Dosage Given to Date: 1.8 Gy
Reference Point Session Dosage Given: 1.8 Gy
Session Number: 1

## 2024-06-15 ENCOUNTER — Other Ambulatory Visit: Payer: Self-pay

## 2024-06-15 ENCOUNTER — Ambulatory Visit
Admission: RE | Admit: 2024-06-15 | Discharge: 2024-06-15 | Disposition: A | Discharge status: Home / Self Care | Source: Ambulatory Visit | Attending: Radiation Oncology | Admitting: Radiation Oncology

## 2024-06-15 DIAGNOSIS — Z510 Encounter for antineoplastic radiation therapy: Secondary | ICD-10-CM | POA: Diagnosis not present

## 2024-06-15 LAB — RAD ONC ARIA SESSION SUMMARY
Course Elapsed Days: 1
Plan Fractions Treated to Date: 2
Plan Prescribed Dose Per Fraction: 1.8 Gy
Plan Total Fractions Prescribed: 25
Plan Total Prescribed Dose: 45 Gy
Reference Point Dosage Given to Date: 3.6 Gy
Reference Point Session Dosage Given: 1.8 Gy
Session Number: 2

## 2024-06-18 ENCOUNTER — Ambulatory Visit
Admission: RE | Admit: 2024-06-18 | Discharge: 2024-06-18 | Disposition: A | Discharge status: Home / Self Care | Source: Ambulatory Visit | Attending: Radiation Oncology | Admitting: Radiation Oncology

## 2024-06-18 ENCOUNTER — Other Ambulatory Visit: Payer: Self-pay

## 2024-06-18 DIAGNOSIS — Z510 Encounter for antineoplastic radiation therapy: Secondary | ICD-10-CM | POA: Diagnosis not present

## 2024-06-18 LAB — RAD ONC ARIA SESSION SUMMARY
Course Elapsed Days: 4
Plan Fractions Treated to Date: 3
Plan Prescribed Dose Per Fraction: 1.8 Gy
Plan Total Fractions Prescribed: 25
Plan Total Prescribed Dose: 45 Gy
Reference Point Dosage Given to Date: 5.4 Gy
Reference Point Session Dosage Given: 1.8 Gy
Session Number: 3

## 2024-06-19 ENCOUNTER — Ambulatory Visit
Admission: RE | Admit: 2024-06-19 | Discharge: 2024-06-19 | Disposition: A | Discharge status: Home / Self Care | Source: Ambulatory Visit | Attending: Radiation Oncology | Admitting: Radiation Oncology

## 2024-06-19 ENCOUNTER — Other Ambulatory Visit: Payer: Self-pay

## 2024-06-19 DIAGNOSIS — Z510 Encounter for antineoplastic radiation therapy: Secondary | ICD-10-CM | POA: Diagnosis not present

## 2024-06-19 LAB — RAD ONC ARIA SESSION SUMMARY
Course Elapsed Days: 5
Plan Fractions Treated to Date: 4
Plan Prescribed Dose Per Fraction: 1.8 Gy
Plan Total Fractions Prescribed: 25
Plan Total Prescribed Dose: 45 Gy
Reference Point Dosage Given to Date: 7.2 Gy
Reference Point Session Dosage Given: 1.8 Gy
Session Number: 4

## 2024-06-20 ENCOUNTER — Ambulatory Visit
Admission: RE | Admit: 2024-06-20 | Discharge: 2024-06-20 | Disposition: A | Discharge status: Home / Self Care | Source: Ambulatory Visit | Attending: Radiation Oncology | Admitting: Radiation Oncology

## 2024-06-20 ENCOUNTER — Other Ambulatory Visit: Payer: Self-pay

## 2024-06-20 DIAGNOSIS — Z510 Encounter for antineoplastic radiation therapy: Secondary | ICD-10-CM | POA: Insufficient documentation

## 2024-06-20 DIAGNOSIS — C61 Malignant neoplasm of prostate: Secondary | ICD-10-CM | POA: Diagnosis not present

## 2024-06-20 LAB — RAD ONC ARIA SESSION SUMMARY
Course Elapsed Days: 6
Plan Fractions Treated to Date: 5
Plan Prescribed Dose Per Fraction: 1.8 Gy
Plan Total Fractions Prescribed: 25
Plan Total Prescribed Dose: 45 Gy
Reference Point Dosage Given to Date: 9 Gy
Reference Point Session Dosage Given: 1.8 Gy
Session Number: 5

## 2024-06-21 ENCOUNTER — Other Ambulatory Visit: Payer: Self-pay

## 2024-06-21 ENCOUNTER — Ambulatory Visit
Admission: RE | Admit: 2024-06-21 | Discharge: 2024-06-21 | Disposition: A | Source: Ambulatory Visit | Attending: Radiation Oncology

## 2024-06-21 DIAGNOSIS — Z510 Encounter for antineoplastic radiation therapy: Secondary | ICD-10-CM | POA: Diagnosis not present

## 2024-06-21 LAB — RAD ONC ARIA SESSION SUMMARY
Course Elapsed Days: 7
Plan Fractions Treated to Date: 6
Plan Prescribed Dose Per Fraction: 1.8 Gy
Plan Total Fractions Prescribed: 25
Plan Total Prescribed Dose: 45 Gy
Reference Point Dosage Given to Date: 10.8 Gy
Reference Point Session Dosage Given: 1.8 Gy
Session Number: 6

## 2024-06-22 ENCOUNTER — Ambulatory Visit
Admission: RE | Admit: 2024-06-22 | Discharge: 2024-06-22 | Disposition: A | Discharge status: Home / Self Care | Source: Ambulatory Visit | Attending: Radiation Oncology | Admitting: Radiation Oncology

## 2024-06-22 ENCOUNTER — Other Ambulatory Visit: Payer: Self-pay

## 2024-06-22 DIAGNOSIS — Z510 Encounter for antineoplastic radiation therapy: Secondary | ICD-10-CM | POA: Diagnosis not present

## 2024-06-22 LAB — RAD ONC ARIA SESSION SUMMARY
Course Elapsed Days: 8
Plan Fractions Treated to Date: 7
Plan Prescribed Dose Per Fraction: 1.8 Gy
Plan Total Fractions Prescribed: 25
Plan Total Prescribed Dose: 45 Gy
Reference Point Dosage Given to Date: 12.6 Gy
Reference Point Session Dosage Given: 1.8 Gy
Session Number: 7

## 2024-06-25 ENCOUNTER — Other Ambulatory Visit: Payer: Self-pay

## 2024-06-25 ENCOUNTER — Ambulatory Visit
Admission: RE | Admit: 2024-06-25 | Discharge: 2024-06-25 | Disposition: A | Discharge status: Home / Self Care | Source: Ambulatory Visit | Attending: Radiation Oncology | Admitting: Radiation Oncology

## 2024-06-25 DIAGNOSIS — Z510 Encounter for antineoplastic radiation therapy: Secondary | ICD-10-CM | POA: Diagnosis not present

## 2024-06-25 LAB — RAD ONC ARIA SESSION SUMMARY
Course Elapsed Days: 11
Plan Fractions Treated to Date: 8
Plan Prescribed Dose Per Fraction: 1.8 Gy
Plan Total Fractions Prescribed: 25
Plan Total Prescribed Dose: 45 Gy
Reference Point Dosage Given to Date: 14.4 Gy
Reference Point Session Dosage Given: 1.8 Gy
Session Number: 8

## 2024-06-26 ENCOUNTER — Ambulatory Visit
Admission: RE | Admit: 2024-06-26 | Discharge: 2024-06-26 | Disposition: A | Discharge status: Home / Self Care | Source: Ambulatory Visit | Attending: Radiation Oncology | Admitting: Radiation Oncology

## 2024-06-26 ENCOUNTER — Other Ambulatory Visit: Payer: Self-pay

## 2024-06-26 DIAGNOSIS — Z510 Encounter for antineoplastic radiation therapy: Secondary | ICD-10-CM | POA: Diagnosis not present

## 2024-06-26 LAB — RAD ONC ARIA SESSION SUMMARY
Course Elapsed Days: 12
Plan Fractions Treated to Date: 9
Plan Prescribed Dose Per Fraction: 1.8 Gy
Plan Total Fractions Prescribed: 25
Plan Total Prescribed Dose: 45 Gy
Reference Point Dosage Given to Date: 16.2 Gy
Reference Point Session Dosage Given: 1.8 Gy
Session Number: 9

## 2024-06-27 ENCOUNTER — Ambulatory Visit
Admission: RE | Admit: 2024-06-27 | Discharge: 2024-06-27 | Disposition: A | Discharge status: Home / Self Care | Source: Ambulatory Visit | Attending: Radiation Oncology | Admitting: Radiation Oncology

## 2024-06-27 ENCOUNTER — Other Ambulatory Visit: Payer: Self-pay

## 2024-06-27 DIAGNOSIS — Z510 Encounter for antineoplastic radiation therapy: Secondary | ICD-10-CM | POA: Diagnosis not present

## 2024-06-27 LAB — RAD ONC ARIA SESSION SUMMARY
Course Elapsed Days: 13
Plan Fractions Treated to Date: 10
Plan Prescribed Dose Per Fraction: 1.8 Gy
Plan Total Fractions Prescribed: 25
Plan Total Prescribed Dose: 45 Gy
Reference Point Dosage Given to Date: 18 Gy
Reference Point Session Dosage Given: 1.8 Gy
Session Number: 10

## 2024-06-28 ENCOUNTER — Ambulatory Visit
Admission: RE | Admit: 2024-06-28 | Discharge: 2024-06-28 | Disposition: A | Discharge status: Home / Self Care | Source: Ambulatory Visit | Attending: Radiation Oncology | Admitting: Radiation Oncology

## 2024-06-28 ENCOUNTER — Other Ambulatory Visit: Payer: Self-pay

## 2024-06-28 ENCOUNTER — Ambulatory Visit
Admission: RE | Admit: 2024-06-28 | Discharge: 2024-06-28 | Disposition: A | Source: Ambulatory Visit | Attending: Radiation Oncology

## 2024-06-28 DIAGNOSIS — Z510 Encounter for antineoplastic radiation therapy: Secondary | ICD-10-CM | POA: Diagnosis not present

## 2024-06-28 LAB — RAD ONC ARIA SESSION SUMMARY
Course Elapsed Days: 14
Plan Fractions Treated to Date: 11
Plan Prescribed Dose Per Fraction: 1.8 Gy
Plan Total Fractions Prescribed: 25
Plan Total Prescribed Dose: 45 Gy
Reference Point Dosage Given to Date: 19.8 Gy
Reference Point Session Dosage Given: 1.8 Gy
Session Number: 11

## 2024-06-29 ENCOUNTER — Other Ambulatory Visit: Payer: Self-pay

## 2024-06-29 ENCOUNTER — Ambulatory Visit
Admission: RE | Admit: 2024-06-29 | Discharge: 2024-06-29 | Disposition: A | Discharge status: Home / Self Care | Source: Ambulatory Visit | Attending: Radiation Oncology | Admitting: Radiation Oncology

## 2024-06-29 ENCOUNTER — Ambulatory Visit

## 2024-06-29 DIAGNOSIS — Z510 Encounter for antineoplastic radiation therapy: Secondary | ICD-10-CM | POA: Diagnosis not present

## 2024-06-29 LAB — RAD ONC ARIA SESSION SUMMARY
Course Elapsed Days: 15
Plan Fractions Treated to Date: 12
Plan Prescribed Dose Per Fraction: 1.8 Gy
Plan Total Fractions Prescribed: 25
Plan Total Prescribed Dose: 45 Gy
Reference Point Dosage Given to Date: 21.6 Gy
Reference Point Session Dosage Given: 1.8 Gy
Session Number: 12

## 2024-06-30 ENCOUNTER — Ambulatory Visit
Admission: EM | Admit: 2024-06-30 | Discharge: 2024-06-30 | Disposition: A | Discharge status: Home / Self Care | Attending: Family Medicine | Admitting: Family Medicine

## 2024-06-30 ENCOUNTER — Encounter: Payer: Self-pay | Admitting: Emergency Medicine

## 2024-06-30 DIAGNOSIS — R519 Headache, unspecified: Secondary | ICD-10-CM | POA: Diagnosis not present

## 2024-06-30 DIAGNOSIS — I10 Essential (primary) hypertension: Secondary | ICD-10-CM | POA: Diagnosis not present

## 2024-06-30 DIAGNOSIS — R030 Elevated blood-pressure reading, without diagnosis of hypertension: Secondary | ICD-10-CM

## 2024-06-30 MED ORDER — AMLODIPINE BESYLATE 10 MG PO TABS: 10.0000 mg | ORAL_TABLET | Freq: Every day | ORAL | 2 refills | Status: AC

## 2024-06-30 MED ORDER — METOPROLOL TARTRATE 25 MG PO TABS: 25.0000 mg | ORAL_TABLET | Freq: Two times a day (BID) | ORAL | 2 refills | Status: DC

## 2024-06-30 NOTE — Discharge Instructions (Addendum)
 I have refilled both of your blood pressure medications today.  Monitor your home blood pressure readings and write them down to keep tabs on how your readings are doing and bring this information to your new primary care appointment.  Return sooner if any worsening symptoms
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
## 2024-06-30 NOTE — ED Triage Notes (Signed)
 Needs refill on amlodipine  and metoprolol .  States has been feeling dizzy since this morning and unable to get his BP medications refilled.

## 2024-07-02 ENCOUNTER — Ambulatory Visit
Admission: RE | Admit: 2024-07-02 | Discharge: 2024-07-02 | Disposition: A | Source: Ambulatory Visit | Attending: Radiation Oncology

## 2024-07-02 ENCOUNTER — Other Ambulatory Visit: Payer: Self-pay

## 2024-07-02 DIAGNOSIS — Z510 Encounter for antineoplastic radiation therapy: Secondary | ICD-10-CM | POA: Diagnosis not present

## 2024-07-02 LAB — RAD ONC ARIA SESSION SUMMARY
Course Elapsed Days: 18
Plan Fractions Treated to Date: 13
Plan Prescribed Dose Per Fraction: 1.8 Gy
Plan Total Fractions Prescribed: 25
Plan Total Prescribed Dose: 45 Gy
Reference Point Dosage Given to Date: 23.4 Gy
Reference Point Session Dosage Given: 1.8 Gy
Session Number: 13

## 2024-07-03 ENCOUNTER — Ambulatory Visit
Admission: RE | Admit: 2024-07-03 | Discharge: 2024-07-03 | Disposition: A | Source: Ambulatory Visit | Attending: Radiation Oncology

## 2024-07-03 ENCOUNTER — Other Ambulatory Visit: Payer: Self-pay

## 2024-07-03 DIAGNOSIS — Z510 Encounter for antineoplastic radiation therapy: Secondary | ICD-10-CM | POA: Diagnosis not present

## 2024-07-03 LAB — RAD ONC ARIA SESSION SUMMARY
Course Elapsed Days: 19
Plan Fractions Treated to Date: 14
Plan Prescribed Dose Per Fraction: 1.8 Gy
Plan Total Fractions Prescribed: 25
Plan Total Prescribed Dose: 45 Gy
Reference Point Dosage Given to Date: 25.2 Gy
Reference Point Session Dosage Given: 1.8 Gy
Session Number: 14

## 2024-07-04 ENCOUNTER — Ambulatory Visit
Admission: RE | Admit: 2024-07-04 | Discharge: 2024-07-04 | Disposition: A | Discharge status: Home / Self Care | Source: Ambulatory Visit | Attending: Radiation Oncology | Admitting: Radiation Oncology

## 2024-07-04 ENCOUNTER — Other Ambulatory Visit: Payer: Self-pay

## 2024-07-04 DIAGNOSIS — Z510 Encounter for antineoplastic radiation therapy: Secondary | ICD-10-CM | POA: Diagnosis not present

## 2024-07-04 LAB — RAD ONC ARIA SESSION SUMMARY
Course Elapsed Days: 20
Plan Fractions Treated to Date: 15
Plan Prescribed Dose Per Fraction: 1.8 Gy
Plan Total Fractions Prescribed: 25
Plan Total Prescribed Dose: 45 Gy
Reference Point Dosage Given to Date: 27 Gy
Reference Point Session Dosage Given: 1.8 Gy
Session Number: 15

## 2024-07-05 ENCOUNTER — Other Ambulatory Visit: Payer: Self-pay

## 2024-07-05 ENCOUNTER — Ambulatory Visit
Admission: RE | Admit: 2024-07-05 | Discharge: 2024-07-05 | Disposition: A | Discharge status: Home / Self Care | Source: Ambulatory Visit | Attending: Radiation Oncology | Admitting: Radiation Oncology

## 2024-07-05 DIAGNOSIS — Z510 Encounter for antineoplastic radiation therapy: Secondary | ICD-10-CM | POA: Diagnosis not present

## 2024-07-05 LAB — RAD ONC ARIA SESSION SUMMARY
Course Elapsed Days: 21
Plan Fractions Treated to Date: 16
Plan Prescribed Dose Per Fraction: 1.8 Gy
Plan Total Fractions Prescribed: 25
Plan Total Prescribed Dose: 45 Gy
Reference Point Dosage Given to Date: 28.8 Gy
Reference Point Session Dosage Given: 1.8 Gy
Session Number: 16

## 2024-07-06 ENCOUNTER — Ambulatory Visit
Admission: RE | Admit: 2024-07-06 | Discharge: 2024-07-06 | Disposition: A | Discharge status: Home / Self Care | Source: Ambulatory Visit | Attending: Radiation Oncology | Admitting: Radiation Oncology

## 2024-07-06 ENCOUNTER — Other Ambulatory Visit: Payer: Self-pay

## 2024-07-06 DIAGNOSIS — Z510 Encounter for antineoplastic radiation therapy: Secondary | ICD-10-CM | POA: Diagnosis not present

## 2024-07-06 LAB — RAD ONC ARIA SESSION SUMMARY
Course Elapsed Days: 22
Plan Fractions Treated to Date: 17
Plan Prescribed Dose Per Fraction: 1.8 Gy
Plan Total Fractions Prescribed: 25
Plan Total Prescribed Dose: 45 Gy
Reference Point Dosage Given to Date: 30.6 Gy
Reference Point Session Dosage Given: 1.8 Gy
Session Number: 17

## 2024-07-09 ENCOUNTER — Other Ambulatory Visit: Payer: Self-pay

## 2024-07-09 ENCOUNTER — Ambulatory Visit
Admission: RE | Admit: 2024-07-09 | Discharge: 2024-07-09 | Disposition: A | Source: Ambulatory Visit | Attending: Radiation Oncology

## 2024-07-09 DIAGNOSIS — Z510 Encounter for antineoplastic radiation therapy: Secondary | ICD-10-CM | POA: Diagnosis not present

## 2024-07-09 LAB — RAD ONC ARIA SESSION SUMMARY
Course Elapsed Days: 25
Plan Fractions Treated to Date: 18
Plan Prescribed Dose Per Fraction: 1.8 Gy
Plan Total Fractions Prescribed: 25
Plan Total Prescribed Dose: 45 Gy
Reference Point Dosage Given to Date: 32.4 Gy
Reference Point Session Dosage Given: 1.8 Gy
Session Number: 18

## 2024-07-10 ENCOUNTER — Ambulatory Visit
Admission: RE | Admit: 2024-07-10 | Discharge: 2024-07-10 | Disposition: A | Discharge status: Home / Self Care | Source: Ambulatory Visit | Attending: Radiation Oncology | Admitting: Radiation Oncology

## 2024-07-10 ENCOUNTER — Other Ambulatory Visit: Payer: Self-pay

## 2024-07-10 DIAGNOSIS — Z510 Encounter for antineoplastic radiation therapy: Secondary | ICD-10-CM | POA: Diagnosis not present

## 2024-07-10 LAB — RAD ONC ARIA SESSION SUMMARY
Course Elapsed Days: 26
Plan Fractions Treated to Date: 19
Plan Prescribed Dose Per Fraction: 1.8 Gy
Plan Total Fractions Prescribed: 25
Plan Total Prescribed Dose: 45 Gy
Reference Point Dosage Given to Date: 34.2 Gy
Reference Point Session Dosage Given: 1.8 Gy
Session Number: 19

## 2024-07-11 ENCOUNTER — Other Ambulatory Visit: Payer: Self-pay

## 2024-07-11 ENCOUNTER — Ambulatory Visit
Admission: RE | Admit: 2024-07-11 | Discharge: 2024-07-11 | Disposition: A | Source: Ambulatory Visit | Attending: Radiation Oncology

## 2024-07-11 DIAGNOSIS — Z510 Encounter for antineoplastic radiation therapy: Secondary | ICD-10-CM | POA: Diagnosis not present

## 2024-07-11 LAB — RAD ONC ARIA SESSION SUMMARY
Course Elapsed Days: 27
Plan Fractions Treated to Date: 20
Plan Prescribed Dose Per Fraction: 1.8 Gy
Plan Total Fractions Prescribed: 25
Plan Total Prescribed Dose: 45 Gy
Reference Point Dosage Given to Date: 36 Gy
Reference Point Session Dosage Given: 1.8 Gy
Session Number: 20

## 2024-07-12 ENCOUNTER — Ambulatory Visit
Admission: RE | Admit: 2024-07-12 | Discharge: 2024-07-12 | Disposition: A | Discharge status: Home / Self Care | Source: Ambulatory Visit | Attending: Radiation Oncology | Admitting: Radiation Oncology

## 2024-07-12 ENCOUNTER — Other Ambulatory Visit: Payer: Self-pay

## 2024-07-12 DIAGNOSIS — Z510 Encounter for antineoplastic radiation therapy: Secondary | ICD-10-CM | POA: Diagnosis not present

## 2024-07-12 LAB — RAD ONC ARIA SESSION SUMMARY
Course Elapsed Days: 28
Plan Fractions Treated to Date: 21
Plan Prescribed Dose Per Fraction: 1.8 Gy
Plan Total Fractions Prescribed: 25
Plan Total Prescribed Dose: 45 Gy
Reference Point Dosage Given to Date: 37.8 Gy
Reference Point Session Dosage Given: 1.8 Gy
Session Number: 21

## 2024-07-13 ENCOUNTER — Ambulatory Visit

## 2024-07-13 ENCOUNTER — Other Ambulatory Visit: Payer: Self-pay

## 2024-07-13 ENCOUNTER — Ambulatory Visit
Admission: RE | Admit: 2024-07-13 | Discharge: 2024-07-13 | Disposition: A | Discharge status: Home / Self Care | Source: Ambulatory Visit | Attending: Radiation Oncology | Admitting: Radiation Oncology

## 2024-07-13 VITALS — BP 158/93 | HR 57 | Ht 68.0 in | Wt 213.1 lb

## 2024-07-13 DIAGNOSIS — I10 Essential (primary) hypertension: Secondary | ICD-10-CM | POA: Diagnosis not present

## 2024-07-13 DIAGNOSIS — Z510 Encounter for antineoplastic radiation therapy: Secondary | ICD-10-CM | POA: Diagnosis not present

## 2024-07-13 DIAGNOSIS — F0390 Unspecified dementia without behavioral disturbance: Secondary | ICD-10-CM

## 2024-07-13 DIAGNOSIS — Z23 Encounter for immunization: Secondary | ICD-10-CM | POA: Diagnosis not present

## 2024-07-13 DIAGNOSIS — Z21 Asymptomatic human immunodeficiency virus [HIV] infection status: Secondary | ICD-10-CM | POA: Diagnosis not present

## 2024-07-13 LAB — RAD ONC ARIA SESSION SUMMARY
Course Elapsed Days: 29
Plan Fractions Treated to Date: 22
Plan Prescribed Dose Per Fraction: 1.8 Gy
Plan Total Fractions Prescribed: 25
Plan Total Prescribed Dose: 45 Gy
Reference Point Dosage Given to Date: 39.6 Gy
Reference Point Session Dosage Given: 1.8 Gy
Session Number: 22

## 2024-07-13 MED ORDER — MECLIZINE HCL 25 MG PO TABS: 25.0000 mg | ORAL_TABLET | Freq: Four times a day (QID) | ORAL | 5 refills | Status: AC | PRN

## 2024-07-13 MED ORDER — CHLORTHALIDONE 12.5 MG PO TABS: 25.0000 mg | ORAL_TABLET | Freq: Every day | ORAL | 1 refills | Status: AC
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
## 2024-07-15 NOTE — Assessment & Plan Note (Signed)
 Currently prescribed Aricept  10 mg.  Continue with current dose.

## 2024-07-15 NOTE — Assessment & Plan Note (Signed)
 Blood pressure elevated, likely due to radiation therapy. Current medications at maximum doses. - Prescribed chlorthalidone  once daily. - Sent prescription to [facility]. - Monitor blood pressure regularly. - Instructed to report side effects or issues with new medication.

## 2024-07-16 ENCOUNTER — Ambulatory Visit
Admission: RE | Admit: 2024-07-16 | Discharge: 2024-07-16 | Disposition: A | Source: Ambulatory Visit | Attending: Radiation Oncology

## 2024-07-16 ENCOUNTER — Other Ambulatory Visit: Payer: Self-pay

## 2024-07-16 DIAGNOSIS — Z510 Encounter for antineoplastic radiation therapy: Secondary | ICD-10-CM | POA: Diagnosis not present

## 2024-07-16 LAB — RAD ONC ARIA SESSION SUMMARY
Course Elapsed Days: 32
Plan Fractions Treated to Date: 23
Plan Prescribed Dose Per Fraction: 1.8 Gy
Plan Total Fractions Prescribed: 25
Plan Total Prescribed Dose: 45 Gy
Reference Point Dosage Given to Date: 41.4 Gy
Reference Point Session Dosage Given: 1.8 Gy
Session Number: 23

## 2024-07-17 ENCOUNTER — Ambulatory Visit
Admission: RE | Admit: 2024-07-17 | Discharge: 2024-07-17 | Disposition: A | Source: Ambulatory Visit | Attending: Radiation Oncology

## 2024-07-17 ENCOUNTER — Other Ambulatory Visit: Payer: Self-pay

## 2024-07-17 DIAGNOSIS — Z510 Encounter for antineoplastic radiation therapy: Secondary | ICD-10-CM | POA: Diagnosis not present

## 2024-07-17 LAB — RAD ONC ARIA SESSION SUMMARY
Course Elapsed Days: 33
Plan Fractions Treated to Date: 24
Plan Prescribed Dose Per Fraction: 1.8 Gy
Plan Total Fractions Prescribed: 25
Plan Total Prescribed Dose: 45 Gy
Reference Point Dosage Given to Date: 43.2 Gy
Reference Point Session Dosage Given: 1.8 Gy
Session Number: 24

## 2024-07-18 ENCOUNTER — Ambulatory Visit
Admission: RE | Admit: 2024-07-18 | Discharge: 2024-07-18 | Disposition: A | Discharge status: Home / Self Care | Source: Ambulatory Visit | Attending: Radiation Oncology | Admitting: Radiation Oncology

## 2024-07-18 ENCOUNTER — Other Ambulatory Visit: Payer: Self-pay

## 2024-07-18 DIAGNOSIS — Z510 Encounter for antineoplastic radiation therapy: Secondary | ICD-10-CM | POA: Diagnosis not present

## 2024-07-18 LAB — RAD ONC ARIA SESSION SUMMARY
Course Elapsed Days: 34
Plan Fractions Treated to Date: 25
Plan Prescribed Dose Per Fraction: 1.8 Gy
Plan Total Fractions Prescribed: 25
Plan Total Prescribed Dose: 45 Gy
Reference Point Dosage Given to Date: 45 Gy
Reference Point Session Dosage Given: 1.8 Gy
Session Number: 25

## 2024-07-19 ENCOUNTER — Ambulatory Visit
Admission: RE | Admit: 2024-07-19 | Discharge: 2024-07-19 | Disposition: A | Discharge status: Home / Self Care | Source: Ambulatory Visit | Attending: Radiation Oncology | Admitting: Radiation Oncology

## 2024-07-19 ENCOUNTER — Other Ambulatory Visit: Payer: Self-pay

## 2024-07-19 DIAGNOSIS — Z510 Encounter for antineoplastic radiation therapy: Secondary | ICD-10-CM | POA: Diagnosis not present

## 2024-07-19 LAB — RAD ONC ARIA SESSION SUMMARY
Course Elapsed Days: 35
Plan Fractions Treated to Date: 1
Plan Prescribed Dose Per Fraction: 2 Gy
Plan Total Fractions Prescribed: 15
Plan Total Prescribed Dose: 30 Gy
Reference Point Dosage Given to Date: 2 Gy
Reference Point Session Dosage Given: 2 Gy
Session Number: 26

## 2024-07-20 ENCOUNTER — Ambulatory Visit
Admission: RE | Admit: 2024-07-20 | Discharge: 2024-07-20 | Disposition: A | Discharge status: Home / Self Care | Source: Ambulatory Visit | Attending: Radiation Oncology | Admitting: Radiation Oncology

## 2024-07-20 ENCOUNTER — Other Ambulatory Visit: Payer: Self-pay

## 2024-07-20 DIAGNOSIS — Z510 Encounter for antineoplastic radiation therapy: Secondary | ICD-10-CM | POA: Diagnosis not present

## 2024-07-20 LAB — RAD ONC ARIA SESSION SUMMARY
Course Elapsed Days: 36
Plan Fractions Treated to Date: 2
Plan Prescribed Dose Per Fraction: 2 Gy
Plan Total Fractions Prescribed: 15
Plan Total Prescribed Dose: 30 Gy
Reference Point Dosage Given to Date: 4 Gy
Reference Point Session Dosage Given: 2 Gy
Session Number: 27

## 2024-07-23 ENCOUNTER — Other Ambulatory Visit: Payer: Self-pay

## 2024-07-23 ENCOUNTER — Ambulatory Visit
Admission: RE | Admit: 2024-07-23 | Discharge: 2024-07-23 | Disposition: A | Discharge status: Home / Self Care | Source: Ambulatory Visit | Attending: Radiation Oncology | Admitting: Radiation Oncology

## 2024-07-23 DIAGNOSIS — C61 Malignant neoplasm of prostate: Secondary | ICD-10-CM | POA: Diagnosis not present

## 2024-07-23 DIAGNOSIS — Z510 Encounter for antineoplastic radiation therapy: Secondary | ICD-10-CM | POA: Diagnosis present

## 2024-07-23 LAB — RAD ONC ARIA SESSION SUMMARY
Course Elapsed Days: 39
Plan Fractions Treated to Date: 3
Plan Prescribed Dose Per Fraction: 2 Gy
Plan Total Fractions Prescribed: 15
Plan Total Prescribed Dose: 30 Gy
Reference Point Dosage Given to Date: 6 Gy
Reference Point Session Dosage Given: 2 Gy
Session Number: 28

## 2024-07-24 ENCOUNTER — Other Ambulatory Visit: Payer: Self-pay

## 2024-07-24 ENCOUNTER — Ambulatory Visit
Admission: RE | Admit: 2024-07-24 | Discharge: 2024-07-24 | Disposition: A | Source: Ambulatory Visit | Attending: Radiation Oncology

## 2024-07-24 DIAGNOSIS — Z510 Encounter for antineoplastic radiation therapy: Secondary | ICD-10-CM | POA: Diagnosis not present

## 2024-07-24 LAB — RAD ONC ARIA SESSION SUMMARY
Course Elapsed Days: 40
Plan Fractions Treated to Date: 4
Plan Prescribed Dose Per Fraction: 2 Gy
Plan Total Fractions Prescribed: 15
Plan Total Prescribed Dose: 30 Gy
Reference Point Dosage Given to Date: 8 Gy
Reference Point Session Dosage Given: 2 Gy
Session Number: 29

## 2024-07-25 ENCOUNTER — Other Ambulatory Visit: Payer: Self-pay

## 2024-07-25 ENCOUNTER — Ambulatory Visit
Admission: RE | Admit: 2024-07-25 | Discharge: 2024-07-25 | Disposition: A | Source: Ambulatory Visit | Attending: Radiation Oncology

## 2024-07-25 DIAGNOSIS — Z510 Encounter for antineoplastic radiation therapy: Secondary | ICD-10-CM | POA: Diagnosis not present

## 2024-07-25 LAB — RAD ONC ARIA SESSION SUMMARY
Course Elapsed Days: 41
Plan Fractions Treated to Date: 5
Plan Prescribed Dose Per Fraction: 2 Gy
Plan Total Fractions Prescribed: 15
Plan Total Prescribed Dose: 30 Gy
Reference Point Dosage Given to Date: 10 Gy
Reference Point Session Dosage Given: 2 Gy
Session Number: 30

## 2024-07-26 ENCOUNTER — Ambulatory Visit
Admission: RE | Admit: 2024-07-26 | Discharge: 2024-07-26 | Disposition: A | Discharge status: Home / Self Care | Source: Ambulatory Visit | Attending: Radiation Oncology | Admitting: Radiation Oncology

## 2024-07-26 ENCOUNTER — Other Ambulatory Visit: Payer: Self-pay

## 2024-07-26 ENCOUNTER — Ambulatory Visit: Admission: RE | Admit: 2024-07-26 | Discharge: 2024-07-26 | Attending: Radiation Oncology

## 2024-07-26 DIAGNOSIS — Z510 Encounter for antineoplastic radiation therapy: Secondary | ICD-10-CM | POA: Diagnosis not present

## 2024-07-26 LAB — RAD ONC ARIA SESSION SUMMARY
Course Elapsed Days: 42
Plan Fractions Treated to Date: 6
Plan Prescribed Dose Per Fraction: 2 Gy
Plan Total Fractions Prescribed: 15
Plan Total Prescribed Dose: 30 Gy
Reference Point Dosage Given to Date: 12 Gy
Reference Point Session Dosage Given: 2 Gy
Session Number: 31

## 2024-07-27 ENCOUNTER — Ambulatory Visit
Admission: RE | Admit: 2024-07-27 | Discharge: 2024-07-27 | Disposition: A | Discharge status: Home / Self Care | Source: Ambulatory Visit | Attending: Radiation Oncology | Admitting: Radiation Oncology

## 2024-07-27 ENCOUNTER — Other Ambulatory Visit: Payer: Self-pay

## 2024-07-27 DIAGNOSIS — Z510 Encounter for antineoplastic radiation therapy: Secondary | ICD-10-CM | POA: Diagnosis not present

## 2024-07-27 LAB — RAD ONC ARIA SESSION SUMMARY
Course Elapsed Days: 43
Plan Fractions Treated to Date: 7
Plan Prescribed Dose Per Fraction: 2 Gy
Plan Total Fractions Prescribed: 15
Plan Total Prescribed Dose: 30 Gy
Reference Point Dosage Given to Date: 14 Gy
Reference Point Session Dosage Given: 2 Gy
Session Number: 32

## 2024-07-30 ENCOUNTER — Ambulatory Visit
Admission: RE | Admit: 2024-07-30 | Discharge: 2024-07-30 | Disposition: A | Discharge status: Home / Self Care | Source: Ambulatory Visit | Attending: Radiation Oncology | Admitting: Radiation Oncology

## 2024-07-30 ENCOUNTER — Other Ambulatory Visit: Payer: Self-pay

## 2024-07-30 DIAGNOSIS — Z510 Encounter for antineoplastic radiation therapy: Secondary | ICD-10-CM | POA: Diagnosis not present

## 2024-07-30 LAB — RAD ONC ARIA SESSION SUMMARY
Course Elapsed Days: 46
Plan Fractions Treated to Date: 8
Plan Prescribed Dose Per Fraction: 2 Gy
Plan Total Fractions Prescribed: 15
Plan Total Prescribed Dose: 30 Gy
Reference Point Dosage Given to Date: 16 Gy
Reference Point Session Dosage Given: 2 Gy
Session Number: 33

## 2024-07-31 ENCOUNTER — Other Ambulatory Visit: Payer: Self-pay

## 2024-07-31 ENCOUNTER — Ambulatory Visit
Admission: RE | Admit: 2024-07-31 | Discharge: 2024-07-31 | Disposition: A | Discharge status: Home / Self Care | Source: Ambulatory Visit | Attending: Radiation Oncology | Admitting: Radiation Oncology

## 2024-07-31 DIAGNOSIS — Z510 Encounter for antineoplastic radiation therapy: Secondary | ICD-10-CM | POA: Diagnosis not present

## 2024-07-31 LAB — RAD ONC ARIA SESSION SUMMARY
Course Elapsed Days: 47
Plan Fractions Treated to Date: 9
Plan Prescribed Dose Per Fraction: 2 Gy
Plan Total Fractions Prescribed: 15
Plan Total Prescribed Dose: 30 Gy
Reference Point Dosage Given to Date: 18 Gy
Reference Point Session Dosage Given: 2 Gy
Session Number: 34

## 2024-08-01 ENCOUNTER — Ambulatory Visit
Admission: RE | Admit: 2024-08-01 | Discharge: 2024-08-01 | Disposition: A | Discharge status: Home / Self Care | Source: Ambulatory Visit | Attending: Radiation Oncology | Admitting: Radiation Oncology

## 2024-08-01 ENCOUNTER — Other Ambulatory Visit: Payer: Self-pay

## 2024-08-01 DIAGNOSIS — Z510 Encounter for antineoplastic radiation therapy: Secondary | ICD-10-CM | POA: Diagnosis not present

## 2024-08-01 LAB — RAD ONC ARIA SESSION SUMMARY
Course Elapsed Days: 48
Plan Fractions Treated to Date: 10
Plan Prescribed Dose Per Fraction: 2 Gy
Plan Total Fractions Prescribed: 15
Plan Total Prescribed Dose: 30 Gy
Reference Point Dosage Given to Date: 20 Gy
Reference Point Session Dosage Given: 2 Gy
Session Number: 35

## 2024-08-02 ENCOUNTER — Ambulatory Visit
Admission: RE | Admit: 2024-08-02 | Discharge: 2024-08-02 | Disposition: A | Source: Ambulatory Visit | Attending: Radiation Oncology

## 2024-08-02 ENCOUNTER — Other Ambulatory Visit: Payer: Self-pay

## 2024-08-02 DIAGNOSIS — Z510 Encounter for antineoplastic radiation therapy: Secondary | ICD-10-CM | POA: Diagnosis not present

## 2024-08-02 LAB — RAD ONC ARIA SESSION SUMMARY
Course Elapsed Days: 49
Plan Fractions Treated to Date: 11
Plan Prescribed Dose Per Fraction: 2 Gy
Plan Total Fractions Prescribed: 15
Plan Total Prescribed Dose: 30 Gy
Reference Point Dosage Given to Date: 22 Gy
Reference Point Session Dosage Given: 2 Gy
Session Number: 36

## 2024-08-03 ENCOUNTER — Ambulatory Visit
Admission: RE | Admit: 2024-08-03 | Discharge: 2024-08-03 | Disposition: A | Discharge status: Home / Self Care | Source: Ambulatory Visit | Attending: Radiation Oncology | Admitting: Radiation Oncology

## 2024-08-03 ENCOUNTER — Ambulatory Visit

## 2024-08-03 ENCOUNTER — Other Ambulatory Visit: Payer: Self-pay

## 2024-08-03 DIAGNOSIS — Z510 Encounter for antineoplastic radiation therapy: Secondary | ICD-10-CM | POA: Diagnosis not present

## 2024-08-03 LAB — RAD ONC ARIA SESSION SUMMARY
Course Elapsed Days: 50
Plan Fractions Treated to Date: 12
Plan Prescribed Dose Per Fraction: 2 Gy
Plan Total Fractions Prescribed: 15
Plan Total Prescribed Dose: 30 Gy
Reference Point Dosage Given to Date: 24 Gy
Reference Point Session Dosage Given: 2 Gy
Session Number: 37

## 2024-08-06 ENCOUNTER — Ambulatory Visit
Admission: RE | Admit: 2024-08-06 | Discharge: 2024-08-06 | Disposition: A | Discharge status: Home / Self Care | Source: Ambulatory Visit | Attending: Radiation Oncology | Admitting: Radiation Oncology

## 2024-08-06 ENCOUNTER — Other Ambulatory Visit: Payer: Self-pay

## 2024-08-06 DIAGNOSIS — Z510 Encounter for antineoplastic radiation therapy: Secondary | ICD-10-CM | POA: Diagnosis not present

## 2024-08-06 LAB — RAD ONC ARIA SESSION SUMMARY
Course Elapsed Days: 53
Plan Fractions Treated to Date: 13
Plan Prescribed Dose Per Fraction: 2 Gy
Plan Total Fractions Prescribed: 15
Plan Total Prescribed Dose: 30 Gy
Reference Point Dosage Given to Date: 26 Gy
Reference Point Session Dosage Given: 2 Gy
Session Number: 38

## 2024-08-07 ENCOUNTER — Other Ambulatory Visit: Payer: Self-pay

## 2024-08-07 ENCOUNTER — Ambulatory Visit
Admission: RE | Admit: 2024-08-07 | Discharge: 2024-08-07 | Disposition: A | Source: Ambulatory Visit | Attending: Radiation Oncology

## 2024-08-07 DIAGNOSIS — Z510 Encounter for antineoplastic radiation therapy: Secondary | ICD-10-CM | POA: Diagnosis not present

## 2024-08-07 LAB — RAD ONC ARIA SESSION SUMMARY
Course Elapsed Days: 54
Plan Fractions Treated to Date: 14
Plan Prescribed Dose Per Fraction: 2 Gy
Plan Total Fractions Prescribed: 15
Plan Total Prescribed Dose: 30 Gy
Reference Point Dosage Given to Date: 28 Gy
Reference Point Session Dosage Given: 2 Gy
Session Number: 39

## 2024-08-08 ENCOUNTER — Other Ambulatory Visit: Payer: Self-pay

## 2024-08-08 ENCOUNTER — Ambulatory Visit
Admission: RE | Admit: 2024-08-08 | Discharge: 2024-08-08 | Disposition: A | Discharge status: Home / Self Care | Source: Ambulatory Visit | Attending: Radiation Oncology | Admitting: Radiation Oncology

## 2024-08-08 DIAGNOSIS — Z510 Encounter for antineoplastic radiation therapy: Secondary | ICD-10-CM | POA: Diagnosis not present

## 2024-08-08 DIAGNOSIS — C61 Malignant neoplasm of prostate: Secondary | ICD-10-CM

## 2024-08-08 LAB — RAD ONC ARIA SESSION SUMMARY
Course Elapsed Days: 55
Plan Fractions Treated to Date: 15
Plan Prescribed Dose Per Fraction: 2 Gy
Plan Total Fractions Prescribed: 15
Plan Total Prescribed Dose: 30 Gy
Reference Point Dosage Given to Date: 30 Gy
Reference Point Session Dosage Given: 2 Gy
Session Number: 40

## 2024-08-10 NOTE — Radiation Completion Notes (Addendum)
" °  Radiation Oncology         [phone] ________________________________  Name: Thomas [name] MRN: [id number]  Date: 08/08/2024  DOB: 1950/12/27  Referring Physician: Lonni Han, M.D. Date of Service: 2024-08-10 Radiation Oncologist: Adina Barge, M.D. [facility]     RADIATION ONCOLOGY END OF TREATMENT NOTE     Diagnosis:  73 y.o. gentleman with oligometastatic, Gleason 4+5, adenocarcinoma of the prostate involving a solitary pelvic lymph node with PSA of 21.6.   Intent: Curative     ==========DELIVERED PLANS==========  First Treatment Date: 2024-06-14 Last Treatment Date: 2024-08-08   Plan Name: Prostate_Pelv Site: Prostate Technique: IMRT Mode: Photon Dose Per Fraction: 1.8 Gy Prescribed Dose (Delivered / Prescribed): 45 Gy / 45 Gy Prescribed Fxs (Delivered / Prescribed): 25 / 25   Plan Name: Prostate_Bst Site: Prostate Technique: IMRT Mode: Photon Dose Per Fraction: 2 Gy Prescribed Dose (Delivered / Prescribed): 30 Gy / 30 Gy Prescribed Fxs (Delivered / Prescribed): 15 / 15     ==========ON TREATMENT VISIT DATES========== 2024-06-14, 2024-06-22, 2024-06-28, 2024-07-06, 2024-07-12, 2024-07-20, 2024-07-26, 2024-08-02    See weekly On Treatment Notes in Epic for details in the Media tab (listed as Progress notes on the On Treatment Visit Dates listed above).  He tolerated the daily radiation treatments relatively well with only modest fatigue and mild increased LUTS.  The patient will receive a call in about one month from the radiation oncology department. He will continue follow up with his urologist, Dr. Han, as well.  ------------------------------------------------   Donnice Barge, MD [facility]  Radiation Oncology Direct Dial: [phone]  Fax: [phone] [email or web address]  Skype  LinkedIn     "

## 2024-08-17 NOTE — Progress Notes (Signed)
 Patient was a RadOnc Consult on 03/20/24 for his oligometastatic, Gleason 4+5, adenocarcinoma of the prostate involving a solitary pelvic lymph node with PSA of 21.6. Patient proceed with treatment recommendations of 8 weeks of external beam therapy concurrent with ADT (started 03/26/24) and had his final radiation treatment on 08/08/24.   Patient is scheduled for a post treatment nurse call on 09/04/24 and has active urology follow up's.    RN left message to review post treatment education and confirm next steps.

## 2024-08-20 ENCOUNTER — Other Ambulatory Visit: Payer: Self-pay

## 2024-08-23 NOTE — Progress Notes (Signed)
°  Radiation Oncology         [phone] ________________________________  Name: Thomas [name] MRN: [id number]  Date of Service: 09/04/2024  DOB: [birth year]  Post Treatment Telephone Note  Diagnosis: Malignant neoplasm of prostate   First Treatment Date: 2024-06-14 Last Treatment Date: 2024-08-08   Plan Name: Prostate_Pelv Site: Prostate Technique: IMRT Mode: Photon Dose Per Fraction: 1.8 Gy Prescribed Dose (Delivered / Prescribed): 45 Gy / 45 Gy Prescribed Fxs (Delivered / Prescribed): 25 / 25   Plan Name: Prostate_Bst Site: Prostate Technique: IMRT Mode: Photon Dose Per Fraction: 2 Gy Prescribed Dose (Delivered / Prescribed): 30 Gy / 30 Gy Prescribed Fxs (Delivered / Prescribed): 15 / 15   Pre Treatment IPSS Score: 9 (as documented in the provider consult note)  The patient was not available for call today. He did not answer and I left a message to return my call.

## 2024-09-04 ENCOUNTER — Ambulatory Visit
Admission: RE | Admit: 2024-09-04 | Discharge: 2024-09-04 | Disposition: A | Source: Ambulatory Visit | Attending: Radiation Oncology

## 2024-09-27 ENCOUNTER — Telehealth: Payer: Self-pay

## 2024-09-27 ENCOUNTER — Other Ambulatory Visit: Payer: Self-pay

## 2024-09-27 MED ORDER — METOPROLOL TARTRATE 25 MG PO TABS: 25.0000 mg | ORAL_TABLET | Freq: Two times a day (BID) | ORAL | 2 refills | Status: AC

## 2024-09-27 NOTE — Telephone Encounter (Signed)
 Pt states he had all his meds filled but still waiting on [facility] for metoprolol  tartrate (LOPRESSOR ) 25 MG tablet . Please advise

## 2024-09-27 NOTE — Telephone Encounter (Signed)
 Sent to pharmacy

## 2024-10-25 ENCOUNTER — Other Ambulatory Visit: Payer: Self-pay | Admitting: Urology

## 2024-10-25 DIAGNOSIS — C61 Malignant neoplasm of prostate: Secondary | ICD-10-CM

## 2024-11-13 ENCOUNTER — Ambulatory Visit
